# Patient Record
Sex: Female | Born: 1955 | Race: Black or African American | Hispanic: No | Marital: Single | Smoking: Current some day smoker
Health system: Southern US, Community
[De-identification: ages and names within clinical notes are randomized; demographics above are authoritative.]

## PROBLEM LIST (undated history)

## (undated) DIAGNOSIS — Z8489 Family history of other specified conditions: Secondary | ICD-10-CM

## (undated) DIAGNOSIS — J189 Pneumonia, unspecified organism: Secondary | ICD-10-CM

## (undated) DIAGNOSIS — N858 Other specified noninflammatory disorders of uterus: Secondary | ICD-10-CM

## (undated) DIAGNOSIS — M1990 Unspecified osteoarthritis, unspecified site: Secondary | ICD-10-CM

## (undated) DIAGNOSIS — N186 End stage renal disease: Secondary | ICD-10-CM

## (undated) DIAGNOSIS — F329 Major depressive disorder, single episode, unspecified: Secondary | ICD-10-CM

## (undated) DIAGNOSIS — R51 Headache: Secondary | ICD-10-CM

## (undated) DIAGNOSIS — F32A Depression, unspecified: Secondary | ICD-10-CM

## (undated) DIAGNOSIS — E669 Obesity, unspecified: Secondary | ICD-10-CM

## (undated) DIAGNOSIS — N055 Unspecified nephritic syndrome with diffuse mesangiocapillary glomerulonephritis: Secondary | ICD-10-CM

## (undated) DIAGNOSIS — I10 Essential (primary) hypertension: Secondary | ICD-10-CM

## (undated) DIAGNOSIS — G709 Myoneural disorder, unspecified: Secondary | ICD-10-CM

## (undated) DIAGNOSIS — E785 Hyperlipidemia, unspecified: Secondary | ICD-10-CM

## (undated) DIAGNOSIS — N2581 Secondary hyperparathyroidism of renal origin: Secondary | ICD-10-CM

## (undated) DIAGNOSIS — R519 Headache, unspecified: Secondary | ICD-10-CM

## (undated) DIAGNOSIS — K219 Gastro-esophageal reflux disease without esophagitis: Secondary | ICD-10-CM

## (undated) DIAGNOSIS — Z992 Dependence on renal dialysis: Secondary | ICD-10-CM

## (undated) DIAGNOSIS — Z86010 Personal history of colonic polyps: Secondary | ICD-10-CM

## (undated) DIAGNOSIS — D649 Anemia, unspecified: Secondary | ICD-10-CM

## (undated) DIAGNOSIS — IMO0001 Reserved for inherently not codable concepts without codable children: Secondary | ICD-10-CM

## (undated) DIAGNOSIS — N049 Nephrotic syndrome with unspecified morphologic changes: Secondary | ICD-10-CM

## (undated) HISTORY — DX: Personal history of colonic polyps: Z86.010

## (undated) HISTORY — PX: ESOPHAGOGASTRODUODENOSCOPY: SHX1529

## (undated) HISTORY — DX: Nephrotic syndrome with unspecified morphologic changes: N04.9

## (undated) HISTORY — DX: Unspecified nephritic syndrome with diffuse mesangiocapillary glomerulonephritis: N05.5

## (undated) HISTORY — PX: UPPER GASTROINTESTINAL ENDOSCOPY: SHX188

## (undated) HISTORY — DX: Hyperlipidemia, unspecified: E78.5

## (undated) HISTORY — DX: Unspecified osteoarthritis, unspecified site: M19.90

## (undated) HISTORY — DX: Other specified noninflammatory disorders of uterus: N85.8

## (undated) HISTORY — DX: Myoneural disorder, unspecified: G70.9

## (undated) HISTORY — DX: Obesity, unspecified: E66.9

## (undated) HISTORY — PX: COLONOSCOPY: SHX174

## (undated) HISTORY — DX: Anemia, unspecified: D64.9

## (undated) HISTORY — DX: Essential (primary) hypertension: I10

---

## 1998-03-26 ENCOUNTER — Other Ambulatory Visit: Admission: RE | Admit: 1998-03-26 | Discharge: 1998-03-26 | Payer: Self-pay | Admitting: Obstetrics & Gynecology

## 1998-06-23 ENCOUNTER — Emergency Department (HOSPITAL_COMMUNITY): Admission: EM | Admit: 1998-06-23 | Discharge: 1998-06-23 | Payer: Self-pay | Admitting: Emergency Medicine

## 2000-09-06 ENCOUNTER — Other Ambulatory Visit: Admission: RE | Admit: 2000-09-06 | Discharge: 2000-09-06 | Payer: Self-pay | Admitting: *Deleted

## 2002-01-29 ENCOUNTER — Encounter: Payer: Self-pay | Admitting: Emergency Medicine

## 2002-01-29 ENCOUNTER — Emergency Department (HOSPITAL_COMMUNITY): Admission: EM | Admit: 2002-01-29 | Discharge: 2002-01-29 | Payer: Self-pay | Admitting: Emergency Medicine

## 2002-01-29 ENCOUNTER — Ambulatory Visit (HOSPITAL_COMMUNITY): Admission: RE | Admit: 2002-01-29 | Discharge: 2002-01-29 | Payer: Self-pay | Admitting: Emergency Medicine

## 2003-08-01 ENCOUNTER — Encounter (INDEPENDENT_AMBULATORY_CARE_PROVIDER_SITE_OTHER): Payer: Self-pay | Admitting: *Deleted

## 2003-08-01 ENCOUNTER — Ambulatory Visit (HOSPITAL_COMMUNITY): Admission: RE | Admit: 2003-08-01 | Discharge: 2003-08-01 | Payer: Self-pay | Admitting: *Deleted

## 2004-05-15 ENCOUNTER — Emergency Department (HOSPITAL_COMMUNITY): Admission: EM | Admit: 2004-05-15 | Discharge: 2004-05-15 | Payer: Self-pay | Admitting: Emergency Medicine

## 2004-12-27 HISTORY — PX: KNEE ARTHROSCOPY: SUR90

## 2005-11-12 ENCOUNTER — Encounter: Admission: RE | Admit: 2005-11-12 | Discharge: 2005-11-12 | Payer: Self-pay | Admitting: Internal Medicine

## 2005-11-22 ENCOUNTER — Ambulatory Visit (HOSPITAL_BASED_OUTPATIENT_CLINIC_OR_DEPARTMENT_OTHER): Admission: RE | Admit: 2005-11-22 | Discharge: 2005-11-22 | Payer: Self-pay | Admitting: Orthopaedic Surgery

## 2005-11-22 ENCOUNTER — Ambulatory Visit (HOSPITAL_COMMUNITY): Admission: RE | Admit: 2005-11-22 | Discharge: 2005-11-22 | Payer: Self-pay | Admitting: Orthopaedic Surgery

## 2006-09-28 ENCOUNTER — Encounter: Admission: RE | Admit: 2006-09-28 | Discharge: 2006-09-28 | Payer: Self-pay | Admitting: Internal Medicine

## 2006-10-13 ENCOUNTER — Encounter: Admission: RE | Admit: 2006-10-13 | Discharge: 2006-10-13 | Payer: Self-pay | Admitting: Internal Medicine

## 2007-01-30 ENCOUNTER — Emergency Department (HOSPITAL_COMMUNITY): Admission: EM | Admit: 2007-01-30 | Discharge: 2007-01-30 | Payer: Self-pay | Admitting: Emergency Medicine

## 2007-03-29 ENCOUNTER — Encounter: Admission: RE | Admit: 2007-03-29 | Discharge: 2007-03-29 | Payer: Self-pay | Admitting: Internal Medicine

## 2008-07-24 ENCOUNTER — Ambulatory Visit (HOSPITAL_COMMUNITY): Admission: RE | Admit: 2008-07-24 | Discharge: 2008-07-24 | Payer: Self-pay | Admitting: Nephrology

## 2008-07-26 ENCOUNTER — Encounter (INDEPENDENT_AMBULATORY_CARE_PROVIDER_SITE_OTHER): Payer: Self-pay | Admitting: Nephrology

## 2008-07-26 ENCOUNTER — Ambulatory Visit (HOSPITAL_COMMUNITY): Admission: RE | Admit: 2008-07-26 | Discharge: 2008-07-27 | Payer: Self-pay | Admitting: Nephrology

## 2008-08-05 ENCOUNTER — Encounter: Admission: RE | Admit: 2008-08-05 | Discharge: 2008-08-05 | Payer: Self-pay | Admitting: Nephrology

## 2009-08-18 ENCOUNTER — Emergency Department (HOSPITAL_COMMUNITY): Admission: EM | Admit: 2009-08-18 | Discharge: 2009-08-18 | Payer: Self-pay | Admitting: Emergency Medicine

## 2009-08-22 ENCOUNTER — Emergency Department (HOSPITAL_COMMUNITY): Admission: EM | Admit: 2009-08-22 | Discharge: 2009-08-22 | Payer: Self-pay | Admitting: Emergency Medicine

## 2009-10-31 ENCOUNTER — Inpatient Hospital Stay (HOSPITAL_COMMUNITY): Admission: AD | Admit: 2009-10-31 | Discharge: 2009-11-07 | Payer: Self-pay | Admitting: Nephrology

## 2009-11-05 ENCOUNTER — Encounter (INDEPENDENT_AMBULATORY_CARE_PROVIDER_SITE_OTHER): Payer: Self-pay | Admitting: Interventional Radiology

## 2009-12-05 ENCOUNTER — Encounter: Admission: RE | Admit: 2009-12-05 | Discharge: 2009-12-05 | Payer: Self-pay | Admitting: Nephrology

## 2009-12-17 ENCOUNTER — Inpatient Hospital Stay (HOSPITAL_COMMUNITY): Admission: AD | Admit: 2009-12-17 | Discharge: 2010-01-09 | Payer: Self-pay | Admitting: Nephrology

## 2009-12-25 ENCOUNTER — Encounter (INDEPENDENT_AMBULATORY_CARE_PROVIDER_SITE_OTHER): Payer: Self-pay | Admitting: Nephrology

## 2009-12-27 HISTORY — PX: VAGINAL HYSTERECTOMY: SUR661

## 2009-12-31 ENCOUNTER — Ambulatory Visit: Payer: Self-pay | Admitting: Vascular Surgery

## 2010-01-02 HISTORY — PX: AV FISTULA PLACEMENT: SHX1204

## 2010-02-19 ENCOUNTER — Encounter (INDEPENDENT_AMBULATORY_CARE_PROVIDER_SITE_OTHER): Payer: Self-pay | Admitting: *Deleted

## 2010-02-19 ENCOUNTER — Ambulatory Visit (HOSPITAL_COMMUNITY): Admission: RE | Admit: 2010-02-19 | Discharge: 2010-02-19 | Payer: Self-pay | Admitting: Nephrology

## 2010-02-24 HISTORY — PX: ARTERIOVENOUS GRAFT PLACEMENT: SUR1029

## 2010-02-26 ENCOUNTER — Ambulatory Visit: Payer: Self-pay | Admitting: Vascular Surgery

## 2010-04-09 ENCOUNTER — Ambulatory Visit: Payer: Self-pay | Admitting: Vascular Surgery

## 2010-04-13 ENCOUNTER — Encounter (INDEPENDENT_AMBULATORY_CARE_PROVIDER_SITE_OTHER): Payer: Self-pay | Admitting: *Deleted

## 2010-04-17 ENCOUNTER — Encounter: Payer: Self-pay | Admitting: Internal Medicine

## 2010-04-21 ENCOUNTER — Ambulatory Visit: Payer: Self-pay | Admitting: Surgery

## 2010-04-21 ENCOUNTER — Ambulatory Visit (HOSPITAL_COMMUNITY): Admission: RE | Admit: 2010-04-21 | Discharge: 2010-04-21 | Payer: Self-pay | Admitting: Surgery

## 2010-05-07 ENCOUNTER — Ambulatory Visit: Payer: Self-pay | Admitting: Internal Medicine

## 2010-05-07 DIAGNOSIS — M1990 Unspecified osteoarthritis, unspecified site: Secondary | ICD-10-CM | POA: Insufficient documentation

## 2010-05-07 DIAGNOSIS — N049 Nephrotic syndrome with unspecified morphologic changes: Secondary | ICD-10-CM

## 2010-05-07 DIAGNOSIS — I10 Essential (primary) hypertension: Secondary | ICD-10-CM | POA: Insufficient documentation

## 2010-05-07 DIAGNOSIS — R079 Chest pain, unspecified: Secondary | ICD-10-CM

## 2010-05-07 HISTORY — DX: Nephrotic syndrome with unspecified morphologic changes: N04.9

## 2010-05-13 ENCOUNTER — Telehealth (INDEPENDENT_AMBULATORY_CARE_PROVIDER_SITE_OTHER): Payer: Self-pay | Admitting: *Deleted

## 2010-06-17 ENCOUNTER — Encounter: Payer: Self-pay | Admitting: Internal Medicine

## 2010-06-24 ENCOUNTER — Encounter: Payer: Self-pay | Admitting: Internal Medicine

## 2010-06-30 IMAGING — CR DG CHEST 1V PORT
1 series · 1 of 1 positions shown · non-contrast
Comparison: Dialysis, tachycardia

CLINICAL DATA: Volume overload, anasarca

PORTABLE CHEST - 1 VIEW

[view not recorded]
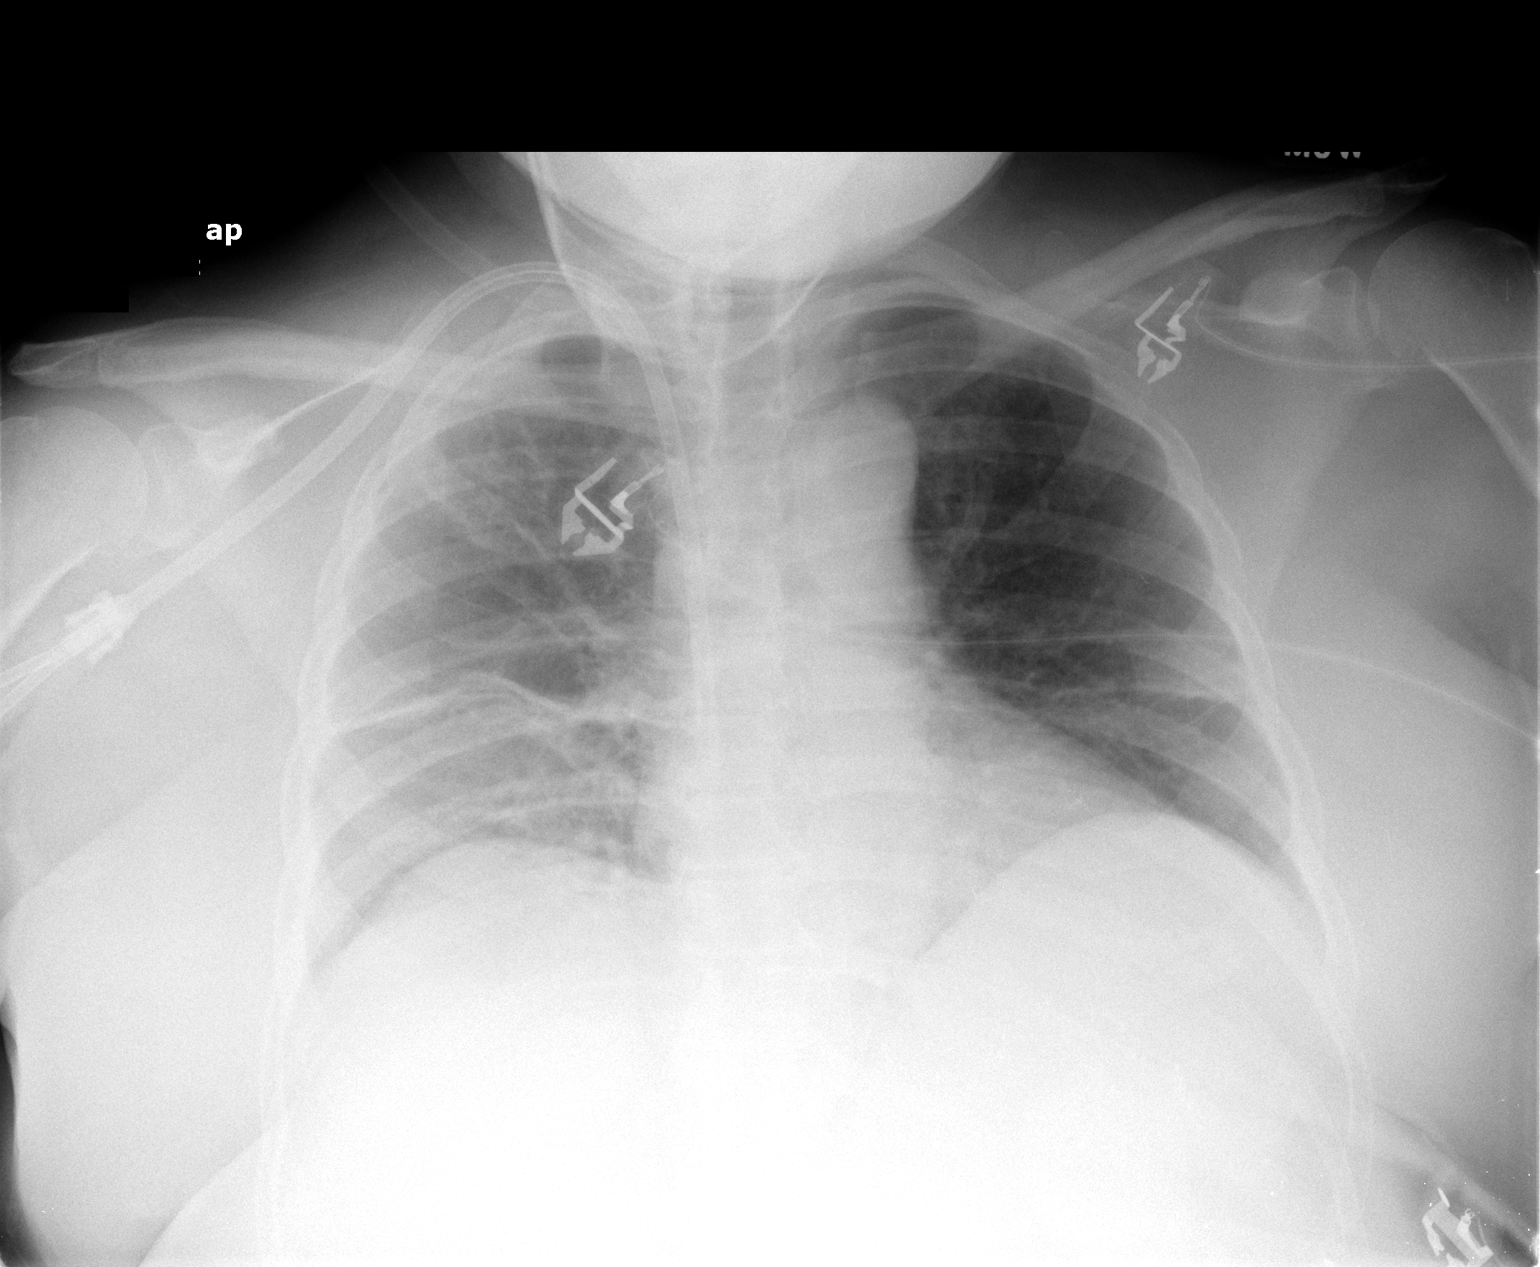

[1 of 1 positions shown; findings below may reference images not displayed]

FINDINGS: There is a large bore central venous line from the right
with tip in the distal SVC unchanged from prior.  Stable cardiac
silhouette.  There is linear atelectasis within both lungs
unchanged from prior.  No evidence pneumothorax.  No evidence of
pulmonary edema.
IMPRESSION: 1.  No significant change.
2.  Bilateral lower lobe and right upper lobe atelectasis.

## 2010-07-01 ENCOUNTER — Encounter: Payer: Self-pay | Admitting: Internal Medicine

## 2010-07-06 ENCOUNTER — Telehealth (INDEPENDENT_AMBULATORY_CARE_PROVIDER_SITE_OTHER): Payer: Self-pay | Admitting: *Deleted

## 2010-07-07 ENCOUNTER — Ambulatory Visit: Payer: Self-pay | Admitting: Internal Medicine

## 2010-07-07 ENCOUNTER — Encounter: Payer: Self-pay | Admitting: Cardiology

## 2010-07-07 ENCOUNTER — Ambulatory Visit: Payer: Self-pay

## 2010-07-07 ENCOUNTER — Ambulatory Visit: Payer: Self-pay | Admitting: Cardiology

## 2010-07-07 ENCOUNTER — Encounter (HOSPITAL_COMMUNITY): Admission: RE | Admit: 2010-07-07 | Discharge: 2010-09-02 | Payer: Self-pay | Admitting: Internal Medicine

## 2010-07-08 ENCOUNTER — Encounter: Payer: Self-pay | Admitting: Internal Medicine

## 2010-07-10 ENCOUNTER — Encounter: Payer: Self-pay | Admitting: Internal Medicine

## 2010-07-13 ENCOUNTER — Encounter: Payer: Self-pay | Admitting: Internal Medicine

## 2010-07-13 ENCOUNTER — Inpatient Hospital Stay (HOSPITAL_COMMUNITY): Admission: EM | Admit: 2010-07-13 | Discharge: 2010-07-15 | Payer: Self-pay | Admitting: Emergency Medicine

## 2010-07-14 ENCOUNTER — Encounter: Payer: Self-pay | Admitting: Internal Medicine

## 2010-07-15 ENCOUNTER — Encounter: Payer: Self-pay | Admitting: Internal Medicine

## 2010-07-15 LAB — CONVERTED CEMR LAB
Cholesterol: 206 mg/dL — ABNORMAL HIGH (ref 0–200)
HDL: 31.2 mg/dL — ABNORMAL LOW (ref >39.00)
Triglycerides: 181 mg/dL — ABNORMAL HIGH (ref 0.0–149.0)

## 2010-07-16 ENCOUNTER — Telehealth (INDEPENDENT_AMBULATORY_CARE_PROVIDER_SITE_OTHER): Payer: Self-pay | Admitting: *Deleted

## 2010-07-16 ENCOUNTER — Encounter: Payer: Self-pay | Admitting: Internal Medicine

## 2010-07-23 ENCOUNTER — Ambulatory Visit: Payer: Self-pay | Admitting: Internal Medicine

## 2010-07-23 DIAGNOSIS — R630 Anorexia: Secondary | ICD-10-CM | POA: Insufficient documentation

## 2010-07-23 DIAGNOSIS — R112 Nausea with vomiting, unspecified: Secondary | ICD-10-CM

## 2010-07-23 DIAGNOSIS — N9489 Other specified conditions associated with female genital organs and menstrual cycle: Secondary | ICD-10-CM | POA: Insufficient documentation

## 2010-07-23 DIAGNOSIS — R109 Unspecified abdominal pain: Secondary | ICD-10-CM | POA: Insufficient documentation

## 2010-07-29 ENCOUNTER — Inpatient Hospital Stay (HOSPITAL_COMMUNITY): Admission: EM | Admit: 2010-07-29 | Discharge: 2010-07-30 | Payer: Self-pay | Admitting: Emergency Medicine

## 2010-07-30 ENCOUNTER — Encounter: Payer: Self-pay | Admitting: Internal Medicine

## 2010-08-25 ENCOUNTER — Ambulatory Visit (HOSPITAL_COMMUNITY): Admission: RE | Admit: 2010-08-25 | Discharge: 2010-08-25 | Payer: Self-pay | Admitting: Vascular Surgery

## 2010-08-25 ENCOUNTER — Ambulatory Visit: Payer: Self-pay | Admitting: Surgery

## 2010-09-02 ENCOUNTER — Telehealth: Payer: Self-pay | Admitting: Internal Medicine

## 2010-09-08 ENCOUNTER — Encounter: Payer: Self-pay | Admitting: Internal Medicine

## 2010-09-09 ENCOUNTER — Encounter (HOSPITAL_COMMUNITY)
Admission: RE | Admit: 2010-09-09 | Discharge: 2010-12-08 | Payer: Self-pay | Source: Home / Self Care | Attending: Nephrology | Admitting: Nephrology

## 2010-09-16 ENCOUNTER — Encounter (HOSPITAL_BASED_OUTPATIENT_CLINIC_OR_DEPARTMENT_OTHER): Admission: RE | Admit: 2010-09-16 | Discharge: 2010-10-21 | Payer: Self-pay | Admitting: Internal Medicine

## 2010-09-24 ENCOUNTER — Ambulatory Visit: Payer: Self-pay | Admitting: Internal Medicine

## 2010-09-24 DIAGNOSIS — E785 Hyperlipidemia, unspecified: Secondary | ICD-10-CM

## 2010-09-25 ENCOUNTER — Telehealth: Payer: Self-pay | Admitting: Internal Medicine

## 2010-09-25 ENCOUNTER — Telehealth (INDEPENDENT_AMBULATORY_CARE_PROVIDER_SITE_OTHER): Payer: Self-pay | Admitting: *Deleted

## 2010-09-29 ENCOUNTER — Ambulatory Visit: Payer: Self-pay | Admitting: Internal Medicine

## 2010-10-22 LAB — CONVERTED CEMR LAB
Direct LDL: 191.5 mg/dL
HDL: 44 mg/dL (ref >39.00)
Total CHOL/HDL Ratio: 6
VLDL: 27.2 mg/dL (ref 0.0–40.0)

## 2010-10-30 ENCOUNTER — Encounter: Payer: Self-pay | Admitting: Internal Medicine

## 2010-11-12 ENCOUNTER — Ambulatory Visit (HOSPITAL_COMMUNITY)
Admission: RE | Admit: 2010-11-12 | Discharge: 2010-11-12 | Payer: Self-pay | Source: Home / Self Care | Admitting: Internal Medicine

## 2010-11-12 ENCOUNTER — Ambulatory Visit: Payer: Self-pay | Admitting: Cardiology

## 2010-11-12 ENCOUNTER — Encounter: Payer: Self-pay | Admitting: Internal Medicine

## 2010-11-12 ENCOUNTER — Ambulatory Visit: Payer: Self-pay

## 2011-01-07 IMAGING — US IR AV DIALYSIS GRAFT DECLOT
1 series · 1 of 1 positions shown · non-contrast
Comparison: none

Clinical Data/Indication: THROMBOSED LEFT ARM AV FISTULA

ARTERIOVENOUS SHUNT FOR DIALYSIS; ADDITIONAL ACCESS,ULTRASOUND
VENOUS ACCESS,PTA VENOUS,ARTERIOVENOUS DIALYSIS GRAFT DECLOT
Sedation: Versed three mg, Fentanyl 175 mg.
Total Moderate Sedation Time: 62 minutes.
Contrast Volume: 65 ml 5mnipaque-OFF.
Additional Medications: Heparin 8888 units.
Fluoroscopy Time: 12.7 minutes.
Procedure: The procedure, risks, benefits, and alternatives were
explained to the patient. Questions regarding the procedure were
encouraged and answered. The patient understands and consents to
the procedure.
The left arm was prepped with betadine in a sterile fashion, and a
sterile drape was applied covering the operative field. A sterile
gown and sterile gloves were used for the procedure.
A micropuncture needle was inserted into the outflow vein directed
towards the central venous structures under sonographic guidance.
It was removed over a 6646 wire.  A 5-French transitional dilator
was inserted.  t-PA was injected.  The Angiojet was then utilized
to [place] the thrombus in the cephalic vein for 100 seconds.
A second micropuncture needle was inserted into the outflow vein
directed towards the arteriovenous anastomosis. It was removed over
a 018 wire which was upsized to Nic Tiger.  6-French sheath was
inserted.  A Fogarty balloon was then utilized to sweep the
arterial plug and outflow vein.  Repeat imaging was performed.
Several stenoses were then dilated to 7 mm adjacent to the
arteriovenous anastomosis.  Final imaging was performed.

[Series 1: ir av dialysis graft declot · 1 of 1 slices shown]
[im 1/1]
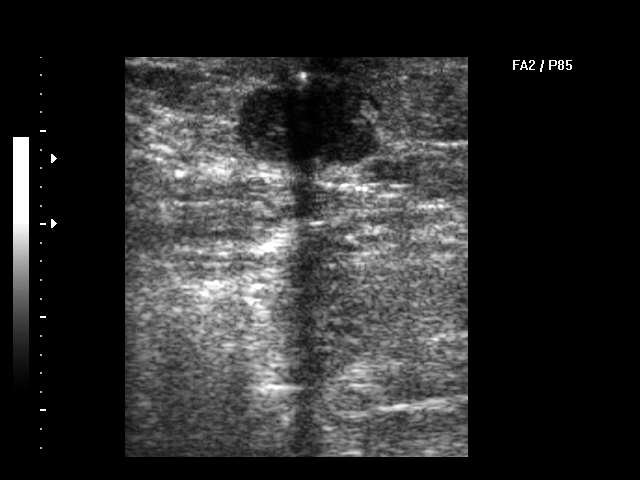

[1 of 1 positions shown; findings below may reference images not displayed]

FINDINGS: Central venography demonstrates patency.

Initial postangioplasty imaging demonstrates near resolution of the
thrombus with some focal areas of narrowing in the outflow vein.

Final imaging demonstrates patency with some persistent
irregularity of the outflow vein.

Complications: None
IMPRESSION: Successful thrombi lysis and thrombectomy of a left arm AV fistula.

## 2011-01-16 ENCOUNTER — Encounter: Payer: Self-pay | Admitting: Internal Medicine

## 2011-01-17 ENCOUNTER — Encounter: Payer: Self-pay | Admitting: Nephrology

## 2011-01-18 ENCOUNTER — Other Ambulatory Visit (HOSPITAL_COMMUNITY): Payer: Self-pay | Admitting: Nephrology

## 2011-01-18 ENCOUNTER — Other Ambulatory Visit (HOSPITAL_COMMUNITY): Payer: Self-pay | Admitting: Interventional Radiology

## 2011-01-18 DIAGNOSIS — N186 End stage renal disease: Secondary | ICD-10-CM

## 2011-01-22 ENCOUNTER — Ambulatory Visit (HOSPITAL_COMMUNITY)
Admission: RE | Admit: 2011-01-22 | Discharge: 2011-01-22 | Payer: Self-pay | Source: Home / Self Care | Attending: Nephrology | Admitting: Nephrology

## 2011-01-26 NOTE — Miscellaneous (Signed)
  Clinical Lists Changes  Orders: Added new Referral order of Echocardiogram (Echo) - Signed 

## 2011-01-26 NOTE — Progress Notes (Signed)
Summary: Nuclear Pre-Procedure  Phone Note Outgoing Call Call back at [facility] Phone [phone]   Call placed by: Stanton Kidney, EMT-P,  May 13, 2010 12:04 PM Action Taken: Phone Call Completed Summary of Call: Left message with information on Myoview Information Sheet (see scanned document for details).     Nuclear Med Background Indications for Stress Test: Evaluation for Ischemia   History: CT/MRI  History Comments: 01/05/10 CT: Chest  Symptoms: Chest Pain, Chest Pressure    Nuclear Pre-Procedure Cardiac Risk Factors: History of Smoking, Hypertension Height (in): 63

## 2011-01-26 NOTE — Discharge Summary (Signed)
Summary: Small Bowel Ileus  NAME:  Kim Hodge, Kim Hodge                ACCOUNT NO.:  [id number]      MEDICAL RECORD NO.:  [id number]          PATIENT TYPE:  INP      LOCATION:  6711                         FACILITY:  [facility]      PHYSICIAN:  Mauro Kaufmann, MD         DATE OF BIRTH:  1956-08-03      DATE OF ADMISSION:  07/13/2010   DATE OF DISCHARGE:  07/15/2010                                  DISCHARGE SUMMARY      ADMISSION DIAGNOSIS:   1. Small bowel ileus.   2. End stage renal disease.   3. Hypertension.   4. History of glomerulonephritis.      DISCHARGE DIAGNOSES:   1. 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   2. Small bowel obstruction improved.   3. End stage renal disease on hemodialysis.  The patient will receive       hemodialysis in a.m. at [facility].   4. Hypertension.      TESTS PERFORMED DURING THE HOSPITAL STAY:   1. X-ray of the abdomen on July 18 showed abdominal bowel gas pattern       with dilated fluid and air filled loops of small bowel; this could       be ileus or small bowel obstruction.   2. CT of the pelvis on July 19 showed large mass arising from the left       pelvis likely __________ region highly suspicious for neoplasm.       Small free pelvic fluid may be secondary.  No evidence of bowel       obstruction or significant ileus.  At least 1 uterine fibroid.       Mass measures 14.3 x 12.7 cm on image 47 transfers.  Similarly 15.6       cm craniocaudal on caudal image 65; this is minimally heterogenous       in density.   3. Thrombosed left arm AV fistula.  The patient underwent successful       thrombolysis and thrombectomy of the left arm AV fistula on July       20.      PERTINENT LABS ON THE DAY OF DISCHARGE:  As of July 19, the patient's   potassium is 4.6, sodium 139,  chloride is 104, CO2 26, BUN 15,   creatinine 4.45, glucose 96.  CA-125 is 45.7.  Urinalysis showed 7 to 10   RBCs, zero to 2 WBCs per high power field, nitrite negative, leukocyte   esterase negative.      BRIEF HOSPITAL COURSE:   1. Ovarian mass.  The patient was seen by GYN in the hospital and       because of the possibility of the malignancy, GYN recommended the       patient be transferred to [facility] for further evaluation       and possible surgery.  At this time the patient's symptoms have       improved.  She is able to take liquid diet  but the size of the mass       probably will cause more compressive symptoms on the bowels.  So       the patient will be transferred to [facility] and Dr. Kyla Balzarine at       [facility] with GYN oncology accepted the patient for       transfer.   2. End stage renal disease.  The patient is currently on hemodialysis       Monday, Wednesday and  Friday.  Today the patient did not get       dialysis as she had a thrombectomy done from the left arm AV       fistula.  The patient will need  dialysis tomorrow and at this time       the patient's BUN/creatinine and potassium were stable so the       patient will be transferred to [facility]      The patient's vitals on day of discharge:  Temperature 97.9, pulse 71,   respiration 18, blood pressure 120/72, O2 saturations 96% on room air.      MEDICATIONS UPON DISCHARGE:   1. Atenolol 50 mg p.o. daily.   2. Amlodipine 10 mg p.o. daily.   3. Aspirin  81 mg p.o. daily.   4. Epoetin alfa injection 20,000 units intravenous on Monday,       Wednesday and Friday.   5. Ferrous sulfate 325 mg p.o. daily.   6. Glimepiride 1 mg p.o. every morning.   7. Hydralazine 50 mg 1 tablet 3 times a day.   8. Capadex 60 mg 1 capsule p.o. daily.   9. Megestrol oral suspension 4 mg by mouth daily.   10.Multivitamin 1 tablet p.o. daily.   11.Omeprazole 20 mg 1-2 tablets per mouth daily as needed.   12.Phos-Lo 2 tablets per mouth 3 times a day with meals.   13.Promethazine 25 mg 1 tablet by mouth daily as needed.   14.Zemplar 2 mcg intravenous on Monday, Wednesday and Friday.        The patient is stable at time of transfer.               Mauro Kaufmann, MD            GL/MEDQ  D:  07/15/2010  T:  07/15/2010  Job:  [id number]      cc:   Dr. Senaida Ores, GYN   Dr. Alfonse Flavors F. Ambrose Mantle, M.D.

## 2011-01-26 NOTE — Procedures (Signed)
Summary: EGD   EGD  Procedure date:  08/01/2003  Findings:      Location: [facility]     NAME:  Kim Hodge, Kim Hodge                          ACCOUNT NO.:  [id number]   MEDICAL RECORD NO.:  [id number]                   PATIENT TYPE:  AMB   LOCATION:  ENDO                                 FACILITY:  [facility]   PHYSICIAN:  Georgiana Spinner, M.D.                 DATE OF BIRTH:  Jun 07, 1956   DATE OF PROCEDURE:  08/01/2003  DATE OF DISCHARGE:                                 OPERATIVE REPORT   PROCEDURE:  Upper endoscopy.   INDICATIONS:  Hemoccult positivity, presumably.   ANESTHESIA:  1. Demerol 50 mg.  2. Versed 5 mg.   DESCRIPTION OF PROCEDURE:  With patient mildly sedated in the left lateral  decubitus position, the Olympus videoscopic endoscope was inserted in the  mouth, passed under direct vision through the esophagus, which appeared  normal, into the stomach.  Fundus, body, antrum, duodenal bulb, and second  portion of duodenum all appeared normal.  From this point, the endoscope was  slowly withdrawn, taking circumferential views of the duodenal mucosa until  the endoscope then pulled back into the stomach, placed in retroflexion to  view the stomach from below.  The endoscope was straightened and withdrawn,  taking circumferential views of the remaining gastric and esophageal mucosa.  The patient's vital signs and pulse oximeter remained stable.  The patient  tolerated the procedure well without apparent complications.   FINDINGS:  Incomplete wrap of GE junction around the endoscope of a mild  degree.  Otherwise, an unremarkable examination.   PLAN:  Proceed to colonoscopy.                                               Georgiana Spinner, M.D.    GMO/MEDQ  D:  08/01/2003  T:  08/01/2003  Job:  [id number]   This report was created from the original endoscopy report, which was reviewed and signed by the above listed endoscopist.

## 2011-01-26 NOTE — Progress Notes (Signed)
  Phone Note From Other Clinic   Caller: Chris/[facility] Initial call taken by: Antonietta Breach lead faxed to [phone] [facility] [place]  July 16, 2010 8:23 AM

## 2011-01-26 NOTE — Progress Notes (Signed)
  ROI signed by pt, Faxed over to [facility] @ [phone] [facility]  September 25, 2010 10:16 AM      Appended Document:  Records recieevd back form [facility] gave to [place]. Dr.Ross Pt

## 2011-01-26 NOTE — Progress Notes (Signed)
Summary: test results/ answering machine message  Phone Note Call from Patient Call back at Home Phone [phone]   Caller: Patient Reason for Call: Talk to Nurse, Lab or Test Results Details for Reason: stress test.  Initial call taken by: Lorne Skeens,  September 02, 2010 9:06 AM  Follow-up for Phone Call        RN s/w Pt re: unsure who called her re: an appt. Pt says she could not understand the message. RN reviewed appt scheduled for 9-29 with Dr Tenny Craw. Pt verbalizes understanding. . Follow-up by: Bernita Raisin, RN, BSN,  September 02, 2010 9:13 AM

## 2011-01-26 NOTE — Assessment & Plan Note (Signed)
Summary: Cardiology Nuclear Study  Nuclear Med Background Indications for Stress Test: Evaluation for Ischemia    History Comments: NO DOCUMENTED CAD  Symptoms: Chest Pain, Chest Pressure, Diaphoresis, Fatigue, Fatigue with Exertion, Rapid HR, Vomiting  Symptoms Comments: Last episode of JX:BJYNW 2011.   Nuclear Pre-Procedure Cardiac Risk Factors: Hypertension, Obesity, Smoker Caffeine/Decaff Intake: None NPO After: 7:00 PM Lungs: Clear IV 0.9% NS with Angio Cath: 18     IV Site: (R) AC IV Started by: Stanton Kidney EMT-P Chest Size (in) 40     Cup Size D     Height (in): 63 Weight (lb): 172 BMI: 30.58  Nuclear Med Study 1 or 2 day study:  1 day     Stress Test Type:  Eugenie Birks Reading MD:  Willa Rough, MD     Referring MD:  Dietrich Pates, MD Resting Radionuclide:  Technetium 77m Tetrofosmin     Resting Radionuclide Dose:  11 mCi  Stress Radionuclide:  Technetium 14m Tetrofosmin     Stress Radionuclide Dose:  33 mCi   Stress Protocol   Lexiscan: 0.4 mg   Stress Test Technologist:  [employer]a [employer] CMA-N     Nuclear Technologist:  Domenic Polite CNMT  Rest Procedure  Myocardial perfusion imaging was performed at rest 45 minutes following the intravenous administration of Myoview Technetium 59m Tetrofosmin.  Stress Procedure  The patient received IV Lexiscan 0.4 mg over 15-seconds.  Myoview injected at 30-seconds.  There were no significant changes with infusion.  Quantitative spect images were obtained after a 45 minute delay.  QPS Raw Data Images:  Normal; no motion artifact; normal heart/lung ratio. Stress Images:  Decreased activity in the inferior wall. Rest Images:  Similar to stress Subtraction (SDS):  Slight reversibility Transient Ischemic Dilatation:  1.1  (Normal <1.22)  Lung/Heart Ratio:  .41  (Normal <0.45)  Quantitative Gated Spect Images QGS EDV:  99 ml QGS ESV:  29 ml QGS EF:  71 % QGS cine images:  Normal Motion  Findings Abnormal      Overall  Impression  Exercise Capacity: Lexiscan BP Response: Normal blood pressure response. Clinical Symptoms: chest tight ECG Impression: No significant ST segment change suggestive of ischemia. Overall Impression Comments: There is decreased activity in the inferior wall. This seems most consistent with possible mild scar with slight ischemia. However, wall motion is completely normal. I can not rule out attenuation as the basis of the inferior findings.  Appended Document: Cardiology Nuclear Study Contacted patient to discuss test results.  Myoview with inferior defect consistent with scar vs soft tissue attenuation .  Very mild ischema.  WOuld recommend echo to evaluate wall motion fully.  Patient very upset.  said she cannot think about this now.  Going to [facility] to evaluate possible ovarian mass. Told pt/sister to call if results needed by [place]

## 2011-01-26 NOTE — Assessment & Plan Note (Signed)
Summary: appt 10:45/ROV/pleas fax notes to number below   Visit Type:  Follow-up Primary Provider:  Terrial Rhodes, MD   History of Present Illness: Patient is a 55 year old with no history of CAD.  SHe is referred for evaluation of chest pain Per her report, she has had 3 episodes of chest pressure since January.  Alll the episodes occurred duing dialysis.  SHe says they occurred on days when they were trying to pull too much fluid off.  No associated SOB.  No dizziness.  When they stop dialysis it stops.  Does not recur when restart SHe has not had any with other activities.  Her activity is down, though, since she is no longer working. patient had a myoview scan that showed scar and or soft tissue attenuation with very mild ischemia.  Recommended echo since then she has been seen at [facility] and underwent a hysterectomy.  No malignancy was found She is back in [place].  Undergoing regular dialysis.  Denies any further chest pain. Of note, she had an echo at [facility] this summer  Current Medications (verified): 1)  Promethazine Hcl 25 Mg Tabs (Promethazine Hcl) .... As Needed For Nausea 2)  Amlodipine Besylate 10 Mg Tabs (Amlodipine Besylate) .... Take One Tablet Once Daily 3)  Aspirin Ec Low Dose 81 Mg Tbec (Aspirin) .... Once Daily 4)  Calcium Acetate 667 Mg Caps (Calcium Acetate (Phos Binder)) .... Take 3 Capsules By Mouth Three Times A Day 5)  Renax 5.5 Mg Tabs (Multiple Vitamins-Minerals-Fa) .... Once Daily 6)  Atenolol 50 Mg Tabs (Atenolol) .... Once Daily 7)  Stool Softener 100 Mg Caps (Docusate Sodium) .... As Needed 8)  Dexilant 60 Mg Cpdr (Dexlansoprazole) .... As Needed 9)  Cipro 500 Mg Tabs (Ciprofloxacin Hcl) .Marland Kitchen.. 1 Tab Two Times A Day(Post - Hystertomy)  Allergies (verified): 1)  ! Ace Inhibitors  Past History:  Past medical, surgical, family and social histories (including risk factors) reviewed, and no changes noted (except as noted below).  Past Medical  History: Reviewed history from 07/23/2010 and no changes required. 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 operative arthroscopy of right knee Kidney biopsy Renal shunt  Family History: Reviewed history from 07/21/2010 and no changes required. Her father died at age 82 from complications of a stroke due to  hypertension.  History of hypertenision Mother died at age 90 from complications of breast cancer.   She has 4 sisters.  One died of an asthma attack.  Family history is significant for diabetes, strokes, hypertension,   cancer, anemia, and asthma.   No family history of kidney disease.  Family History of Ovarian Cancer: Sister  Social History: Reviewed history from 07/23/2010 and no changes required. Single  Tobacco Use - Former.   Quit in Jan 2011. previous 1/2 ppd for 32 yrs. Alcohol Use - no Drug Use - remote marijuana. Daily Caffeine Use 1  Review of Systems       ALl systems reviewed.  Negative to the above problem.  Vital Signs:  Patient profile:   55 year old female Height:      63 inches Weight:      156 pounds Pulse rate:   71 / minute BP sitting:   122 / 75  (left arm) Cuff size:   regular  Vitals Entered  By: Burnett Kanaris, CNA (September 24, 2010 2:14 PM)  Physical Exam  Additional Exam:  patient is in NAD HEENT:  Normocephalic, atraumatic. EOMI, PERRLA.  Neck: JVP is normal. No thyromegaly. No bruits.  Lungs: clear to auscultation. No rales no wheezes.  Heart: Regular rate and rhythm. Normal S1, S2. No S3.   No significant murmurs. PMI not displaced.  Abdomen:  Supple, nontender. Normal bowel sounds. No masses. No hepatomegaly.  Extremities:   Good distal pulses throughout. No  lower extremity edema.  Musculoskeletal :moving all extremities.  Neuro:   alert and oriented x3.    Impression & Recommendations:  Problem # 1:  CHEST PAIN-UNSPECIFIED (ICD-786.50) patient was referred earlier this year for chest pains.  Myoview had inferior scar and/or soft tissue attenaution with small amt ischemia. Since then she underwent hysterectomy at [facility].  She had an echo there which we will get reprot of Overall, she has not had any further spellls of chest pain.  Even with dialysis. I would not change her regimne.  I will review echo  Otherwise be available as needed.  Problem # 2:  HYPERTENSION (ICD-401.9) GOOd control  Problem # 3:  HYPERLIPIDEMIA-MIXED (ICD-272.4) LDL was 142 earlier this year.  Since she has had so much happen with surgery i would repeat lipids.  If still high would recommend Rx.  Patient Instructions: 1)  Your physician recommends that you schedule a follow-up appointment as needed 2)  Your physician recommends that you continue on your current medications as directed. Please refer to the Current Medication list given to you today. 3)  Please have fasting lipid profile drawn at dialysis.  You have prescription for this.

## 2011-01-26 NOTE — Letter (Signed)
Summary: [facility] Notes   [facility] Notes   Imported By: Roderic Ovens 11/06/2010 11:15:34  _____________________________________________________________________  External Attachment:    Type:   Image     Comment:   External Document

## 2011-01-26 NOTE — Assessment & Plan Note (Signed)
Summary: np6/chest pains sob   Visit Type:  Initial Consult Primary Provider:  DR COLONDO  CC:  chest pain.  History of Present Illness: Patient is a 55 year old with no history of CAD.  SHe is referred for evaluation of chest pain Per her report, she has had 3 episodes of chest pressure since January.  Alll the episodes occurred duing dialysis.  SHe says they occurred on days when they were trying to pull too much fluid off.  No associated SOB.  No dizziness.  When they stop dialysis it stops.  Does not recur when restart SHe has not had any with other activities.  Her activity is down, though, since she is no longer working.   Current Medications (verified): 1)  Megace Oral 40 Mg/ml Susp (Megestrol Acetate) .... 4 Mg Once Daily 2)  Promethazine Hcl 25 Mg Tabs (Promethazine Hcl) .... As Needed For Nausea 3)  Bystolic 5 Mg Tabs (Nebivolol Hcl) .... Take One Tablet Once Daily 4)  Amlodipine Besylate 10 Mg Tabs (Amlodipine Besylate) .... Take One Tablet Once Daily 5)  Amaryl 1 Mg Tabs (Glimepiride) .... Every Morning 1 Tablet 6)  Multivitamins  Tabs (Multiple Vitamin) .... Once Daily Daily Vite 7)  Omeprazole 20 Mg Cpdr (Omeprazole) .... Take One Capsule Daily, Two Times A Day If Needed 8)  Aspirin Ec Low Dose 81 Mg Tbec (Aspirin) .... Once Daily  Allergies (verified): 1)  ! Ace Inhibitors  Past History:  Past Surgical History: Last updated: 05-12-10 operative arthroscopy of right knee  Family History: Last updated: 05-12-10 Her father died at age 59 from complications of a stroke due to  hypertension.  History of hypertenision Mother died at age 2 from complications of breast cancer.   She has 4 sisters.  One died of an asthma attack.  Family history is significant for diabetes, strokes, hypertension,   cancer, anemia, and asthma.   No family history of kidney disease.   Social History: Last updated: 05/12/2010 Single  Tobacco Use - Former.   Quit in Jan 2011.  previous 1/2 ppd for 32 yrs. Alcohol Use - no Drug Use - remote marijuana.  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  History of hypertenision Mother died at age 47 from complications of breast cancer.   She has 4 sisters.  One died of an asthma attack.  Family history is significant for diabetes, strokes, hypertension,   cancer, anemia, and asthma.   No family history of kidney disease.   Social History: Single  Tobacco Use - Former.   Quit in Jan 2011. previous 1/2 ppd for 32 yrs. Alcohol Use - no Drug Use - remote marijuana.  Review of Systems       All systems reviewed.  Negatvie to the above problem.  Vital Signs:  Patient profile:   55 year old female Height:      63 inches Weight:      189 pounds BMI:     33.60 Pulse rate:   72 / minute BP sitting:   112 / 70  (left arm) Cuff size:   regular  Vitals Entered By: Burnett Kanaris, CNA (05/12/2010 1:54 PM)  Physical Exam  Additional Exam:  Patietn is in NAD HEENT:  Normocephalic, atraumatic. EOMI, PERRLA.  Neck: JVP is normal. No thyromegaly.  No bruits.  Lungs: clear to auscultation. No rales no wheezes.  Heart: Regular rate and rhythm. Normal S1, S2. No S3.   No significant murmurs. PMI not displaced.  Abdomen:  Supple, nontender. Normal bowel sounds. No masses. No hepatomegaly.  Extremities:   Good distal pulses throughout. No lower extremity edema.  Musculoskeletal :moving all extremities.  Neuro:   alert and oriented x3.    EKG  Procedure date:  04/17/2010  Findings:      NSR.  80 bpm.  Impression & Recommendations:  Problem # 1:  CHEST PAIN-UNSPECIFIED (ICD-786.50)  Patient has several risk factors for CAD.  Pain has been infrequent.  BUt, with risks and need for cont'd  dialysis will schedule myoviow to r/o significant ischemia. Will schedule myoview to evaluate.  Check on lipids.  Her updated medication list for this problem includes:    Bystolic 5 Mg Tabs (Nebivolol hcl) .Marland Kitchen... Take one tablet once daily    Amlodipine Besylate 10 Mg Tabs (Amlodipine besylate) .Marland Kitchen... Take one tablet once daily    Aspirin Ec Low Dose 81 Mg Tbec (Aspirin) ..... Once daily  Her updated medication list for this problem includes:    Bystolic 5 Mg Tabs (Nebivolol hcl) .Marland Kitchen... Take one tablet once daily    Amlodipine Besylate 10 Mg Tabs (Amlodipine besylate) .Marland Kitchen... Take one tablet once daily    Aspirin Ec Low Dose 81 Mg Tbec (Aspirin) ..... Once daily  Problem # 2:  HYPERTENSION (ICD-401.9)  Good control  Her updated medication list for this problem includes:    Bystolic 5 Mg Tabs (Nebivolol hcl) .Marland Kitchen... Take one tablet once daily    Amlodipine Besylate 10 Mg Tabs (Amlodipine besylate) .Marland Kitchen... Take one tablet once daily    Aspirin Ec Low Dose 81 Mg Tbec (Aspirin) ..... Once daily  Problem # 3:  NEPHROTIC SYNDROME (ICD-581.9) COntinue dialysis  Other Orders: Nuclear Stress Test (Nuc Stress Test)  Patient Instructions: 1)  Your physician has requested that you have an adenosine myoview.  For further information please visit [email or web address]  Please follow instruction sheet, as given. 2)  Your physician recommends that you return for a FASTING lipid profile: day of stress test  786.5

## 2011-01-26 NOTE — Letter (Signed)
Summary: [facility]   Imported By: Sherian Rein 07/27/2010 08:19:10  _____________________________________________________________________  External Attachment:    Type:   Image     Comment:   External Document

## 2011-01-26 NOTE — Letter (Signed)
Summary: New Patient letter  [facility]  [address] [place], [place] [address]   Phone: [phone]  Fax: [phone]       04/13/2010 MRN: [id number]  Kim Hodge [address] [place], [place]  [address]  Dear Ms. Bann,  Welcome to the Gastroenterology Division at [facility].    You are scheduled to see Dr. Jarold Motto on 05/12/2010 at 8:30AM on the 3rd floor at [facility], [address]  We ask that you try to arrive at our office 15 minutes prior to your appointment time to allow for check-in.  We would like you to complete the enclosed self-administered evaluation form prior to your visit and bring it with you on the day of your appointment.  We will review it with you.  Also, please bring a complete list of all your medications or, if you prefer, bring the medication bottles and we will list them.  Please bring your insurance card so that we may make a copy of it.  If your insurance requires a referral to see a specialist, please bring your referral form from your primary care physician.  Co-payments are due at the time of your visit and may be paid by cash, check or credit card.     Your office visit will consist of a consult with your physician (includes a physical exam), any laboratory testing he/she may order, scheduling of any necessary diagnostic testing (e.g. x-ray, ultrasound, CT-scan), and scheduling of a procedure (e.g. Endoscopy, Colonoscopy) if required.  Please allow enough time on your schedule to allow for any/all of these possibilities.    If you cannot keep your appointment, please call [phone] to cancel or reschedule prior to your appointment date.  This allows [place] the opportunity to schedule an appointment for another patient in need of care.  If you do not cancel or reschedule by 5 p.m. the business day prior to your appointment date, you will be charged a $50.00 late cancellation/no-show fee.    Thank you for choosing  [facility] for your medical needs.  We appreciate the opportunity to care for you.  Please visit [place] at our website  to learn more about our practice.                     Sincerely,                                                             The Gastroenterology Division

## 2011-01-26 NOTE — Progress Notes (Signed)
Summary: Nuclear Pre-Procedure  Phone Note Outgoing Call   Call placed by: Milana Na, EMT-P,  July 06, 2010 1:31 PM Summary of Call: Left message with information on Myoview Information Sheet (see scanned document for details).      Nuclear Med Background Indications for Stress Test: Evaluation for Ischemia   History: CT/MRI  History Comments: 01/05/10 CT: Chest  Symptoms: Chest Pain, Chest Pressure    Nuclear Pre-Procedure Cardiac Risk Factors: History of Smoking, Hypertension Height (in): 63  Nuclear Med Study Referring MD:  P.Ross

## 2011-01-26 NOTE — Progress Notes (Signed)
Summary: HD can draw blood but cannot procress  Phone Note Call from Patient Call back at Home Phone [phone]   Caller: Patient Reason for Call: Talk to Nurse Summary of Call: Hd can draw the blood but cannot process. pls advise.  Initial call taken by: Lorne Skeens,  September 25, 2010 3:43 PM  Follow-up for Phone Call        Called pateint back.... the dialysis center will not run a Lipid panel so she will come to [facility]. lab on 10/4 fasting for a Lipid panel .Will oder in IDX.  Layne Benton, RN, BSN  September 25, 2010 6:26 PM

## 2011-01-26 NOTE — Procedures (Signed)
Summary: Colon   Colonoscopy  Procedure date:  08/01/2003  Findings:      Location:  [facility].     NAME:  Kim Hodge, Kim Hodge                          ACCOUNT NO.:  [id number]   MEDICAL RECORD NO.:  [id number]                   PATIENT TYPE:  AMB   LOCATION:  ENDO                                 FACILITY:  [facility]   PHYSICIAN:  Georgiana Spinner, M.D.                 DATE OF BIRTH:  [birth year]   DATE OF PROCEDURE:  08/01/2003  DATE OF DISCHARGE:                                 OPERATIVE REPORT   PROCEDURE:  Colonoscopy.   INDICATIONS:  Hemoccult positivity, presumably.   ANESTHESIA:  1. Demerol 50 mg.  2. Versed 5 mg.   DESCRIPTION OF PROCEDURE:  With patient mildly sedated in the left lateral  decubitus position, the Olympus videoscopic colonoscope was inserted in the  rectum and passed under direct vision to the cecum, identified by the  ileocecal valve and appendiceal orifice, both of which were photographed.  From this point, the colonoscope was slowly withdrawn, taking  circumferential views of the entire colonic mucosa, stopping only in the  rectum which appeared normal on direct and retroflexed view.  The endoscope  was straightened and withdrawn.  The patient's vital signs and pulse  oximeter remained stable.  The patient tolerated the procedure well without  apparent complications.   FINDINGS:  Unremarkable colonoscopic examination.   PLAN:  As I have discussed with the patient previously, if these are  negative exams which they have been, then a gynecologic evaluation because  of menorrhagia may be in order.                                               Georgiana Spinner, M.D.    GMO/MEDQ  D:  08/01/2003  T:  08/01/2003  Job:  [id number]   This report was created from the original endoscopy report, which was reviewed and signed by the above listed endoscopist.

## 2011-01-26 NOTE — Assessment & Plan Note (Signed)
Summary: anorexia,nausea,vomiting...as.   History of Present Illness Visit Type: Initial Consult Primary GI MD: Stan Head MD [facility] Primary Provider: Terrial Rhodes, MD Requesting Provider: Terrial Rhodes, MD Chief Complaint: anorexia, Nausea & vomiting since patient started dialysis 1/11 History of Present Illness:   55 yo African-American woman that began hemodialysis in Jan 2011. She has episodic and intermittent vomiting without much nausea or vomiting. Occurs on average about twice a week. she notices it if she forces herself to eat it can happen, and if she gags while brushing her teeth. She also has early satiety and anorexia, and is losing 2#/week. some of that was fluid, probably most, but still losing weight.  She was hospitalized last week and 7/18-7/20 at [facility] and then [facility]. She had an ileus and ovarian mass on CT.Marland Kitchen Ovarian mass suspected to be malignant but transvaginal biopsy was negative for malignancy she says. Records reviewed.   GI Review of Systems    Reports abdominal pain, acid reflux, belching, chest pain, loss of appetite, nausea, vomiting, and  weight loss.     Location of  Abdominal pain: right side.    Denies bloating, dysphagia with liquids, dysphagia with solids, heartburn, vomiting blood, and  weight gain.      Reports constipation, diarrhea, hemorrhoids, and  irritable bowel syndrome.     Denies anal fissure, black tarry stools, change in bowel habit, diverticulosis, fecal incontinence, heme positive stool, jaundice, light color stool, liver problems, rectal bleeding, and  rectal pain. CT Abdomen/Pelvis  Procedure date:  07/14/2010  Findings:         1.  Large mass arising from the left pelvis, likely ovarian in   origin.  Highly suspicious for neoplasm.  Further evaluation with   gynecological consultation and consideration of abdominal/pelvic   MRI recommended. Case was discussed with Dr. Michaell Cowing prior to this   dictation on 07/14/2010.   2.   Small free pelvic fluid may be secondary.   3.  No evidence of bowel obstruction or significant ileus.   4.  At least one uterine fibroid.  This would also be better   evaluated at MRI.   5.  Possible too small to characterize right hepatic lobe lesion.      Current Medications (verified): 1)  Promethazine Hcl 25 Mg Tabs (Promethazine Hcl) .... As Needed For Nausea 2)  Amlodipine Besylate 10 Mg Tabs (Amlodipine Besylate) .... Take One Tablet Once Daily 3)  Aspirin Ec Low Dose 81 Mg Tbec (Aspirin) .... Once Daily 4)  Calcium Acetate 667 Mg Caps (Calcium Acetate (Phos Binder)) .... Take 3 Capsules By Mouth Three Times A Day 5)  Metolazone 5 Mg Tabs (Metolazone) .... Take 2 Tablets By Mouth Once Daily 6)  Renax 5.5 Mg Tabs (Multiple Vitamins-Minerals-Fa) .... Once Daily 7)  Atenolol 50 Mg Tabs (Atenolol) .... Once Daily 8)  Stool Softener 100 Mg Caps (Docusate Sodium) .... As Needed 9)  Ferrous Sulfate 325 (65 Fe) Mg Tbec (Ferrous Sulfate) .... Take 2 Tablets By Mouth Once Daily 10)  Hydrocodone-Acetaminophen 5-325 Mg Tabs (Hydrocodone-Acetaminophen) .... As Needed 11)  Dexilant 60 Mg Cpdr (Dexlansoprazole) .... As Needed  Allergies (verified): 1)  ! 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 Her father died at age 27 from complications of a stroke due to  hypertension.  History of hypertenision Mother died at age 83 from complications of breast cancer.   She has 4 sisters.  One died of an asthma attack.  Family history is significant for diabetes, strokes, hypertension,   cancer,  anemia, and asthma.   No family history of kidney disease.  Family History of Ovarian Cancer: Sister  Social History: Single  Tobacco Use - Former.   Quit in Jan 2011. previous 1/2 ppd for 32 yrs. Alcohol Use - no Drug Use - remote marijuana. Daily Caffeine Use 1  Review of Systems       The patient complains of itching, night sweats, shortness of breath, and swelling of feet/legs.         All other ROS negative except as per HPI.   Vital Signs:  Patient profile:   55 year old female Height:      63 inches Weight:      168.25 pounds BMI:     29.91  Vitals Entered By: June McMurray CMA Duncan Dull) (July 23, 2010 1:41 PM)  Physical Exam  General:  Well developed, well nourished, no acute distress. Eyes:  PERRLA, no icterus. Mouth:  No deformity or lesions, in mouth or pharynx Neck:  Supple; no masses or thyromegaly. Lungs:  Clear throughout to auscultation. Heart:  Regular rate and rhythm; no murmurs, rubs,  or bruits. Distant. Abdomen:  Firm, tender RUQ with area in RUQ and R mid quadrant. ? if liver - doubt. No splash no other masses BS+ Extremities:  no edema Neurologic:  Alert and  oriented x4;  grossly normal neurologically. Cervical Nodes:  No significant cervical or supraclavicular adenopathy.  Psych:  Alert and cooperative. Normal mood and affect.  Hospital records from [facility] reviewed, CT scan images reviewed with patient, [facility] records requested.  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 Leone Payor will be available if needed but your ovarian mass must be the cause of your vomiting ad abdominal pain.  i expect your problems to resolve after the tumor is removed. 2)  Please schedule a follow-up appointment as needed.  3)  You should have a routine colonoscopy in 2014, sooner if otherwise indicated (if problems). 4)  Copy sent to : Bertram Savin, MD; Ronita Hipps, MD 5)  The medication list was reviewed and reconciled.  All changed / newly prescribed medications were explained.  A complete medication list was provided to the patient / caregiver.

## 2011-02-03 ENCOUNTER — Other Ambulatory Visit (HOSPITAL_COMMUNITY): Payer: Self-pay

## 2011-02-04 ENCOUNTER — Other Ambulatory Visit (HOSPITAL_COMMUNITY): Payer: Self-pay

## 2011-03-12 LAB — RENAL FUNCTION PANEL
BUN: 11 mg/dL (ref 6–23)
CO2: 31 mEq/L (ref 19–32)
Glucose, Bld: 109 mg/dL — ABNORMAL HIGH (ref 70–99)
Potassium: 4.6 mEq/L (ref 3.5–5.1)
Sodium: 140 mEq/L (ref 135–145)

## 2011-03-12 LAB — GLUCOSE, CAPILLARY
Glucose-Capillary: 218 mg/dL — ABNORMAL HIGH (ref 70–99)
Glucose-Capillary: 64 mg/dL — ABNORMAL LOW (ref 70–99)

## 2011-03-12 LAB — CBC
HCT: 27.8 % — ABNORMAL LOW (ref 36.0–46.0)
Hemoglobin: 8.3 g/dL — ABNORMAL LOW (ref 12.0–15.0)
MCV: 86.1 fL (ref 78.0–100.0)
MCV: 89.7 fL (ref 78.0–100.0)
Platelets: 598 10*3/uL — ABNORMAL HIGH (ref 150–400)
RBC: 4.25 MIL/uL (ref 3.87–5.11)
RDW: 17.2 % — ABNORMAL HIGH (ref 11.5–15.5)
WBC: 11.6 10*3/uL — ABNORMAL HIGH (ref 4.0–10.5)
WBC: 13 10*3/uL — ABNORMAL HIGH (ref 4.0–10.5)

## 2011-03-12 LAB — IRON AND TIBC
Iron: 17 ug/dL — ABNORMAL LOW (ref 42–135)
Saturation Ratios: 8 % — ABNORMAL LOW (ref 20–55)
TIBC: 208 ug/dL — ABNORMAL LOW (ref 250–470)
UIBC: 191 ug/dL

## 2011-03-12 LAB — DIFFERENTIAL
Basophils Absolute: 0.1 10*3/uL (ref 0.0–0.1)
Basophils Relative: 1 % (ref 0–1)
Eosinophils Relative: 3 % (ref 0–5)
Lymphocytes Relative: 21 % (ref 12–46)
Monocytes Absolute: 0.7 10*3/uL (ref 0.1–1.0)
Monocytes Relative: 6 % (ref 3–12)
Neutro Abs: 8 10*3/uL — ABNORMAL HIGH (ref 1.7–7.7)

## 2011-03-12 LAB — COMPREHENSIVE METABOLIC PANEL
ALT: 14 U/L (ref 0–35)
AST: 30 U/L (ref 0–37)
Albumin: 2.9 g/dL — ABNORMAL LOW (ref 3.5–5.2)
CO2: 29 mEq/L (ref 19–32)
Chloride: 100 mEq/L (ref 96–112)
Creatinine, Ser: 2.59 mg/dL — ABNORMAL HIGH (ref 0.4–1.2)
GFR calc non Af Amer: 19 mL/min — ABNORMAL LOW (ref >60)
Sodium: 138 mEq/L (ref 135–145)
Total Bilirubin: 0.5 mg/dL (ref 0.3–1.2)

## 2011-03-12 LAB — FERRITIN: Ferritin: 432 ng/mL — ABNORMAL HIGH (ref 10–291)

## 2011-03-13 LAB — RENAL FUNCTION PANEL
Albumin: 2.8 g/dL — ABNORMAL LOW (ref 3.5–5.2)
BUN: 15 mg/dL (ref 6–23)
Chloride: 104 mEq/L (ref 96–112)
Creatinine, Ser: 4.45 mg/dL — ABNORMAL HIGH (ref 0.4–1.2)
Phosphorus: 3.3 mg/dL (ref 2.3–4.6)

## 2011-03-13 LAB — APTT: aPTT: 28 seconds (ref 24–37)

## 2011-03-13 LAB — COMPREHENSIVE METABOLIC PANEL
BUN: 9 mg/dL (ref 6–23)
Calcium: 9.9 mg/dL (ref 8.4–10.5)
Glucose, Bld: 98 mg/dL (ref 70–99)
Sodium: 139 mEq/L (ref 135–145)
Total Protein: 7.2 g/dL (ref 6.0–8.3)

## 2011-03-13 LAB — CBC
HCT: 42 % (ref 36.0–46.0)
MCHC: 32.2 g/dL (ref 30.0–36.0)
MCV: 87.9 fL (ref 78.0–100.0)
RDW: 17.8 % — ABNORMAL HIGH (ref 11.5–15.5)

## 2011-03-13 LAB — URINALYSIS, ROUTINE W REFLEX MICROSCOPIC
Ketones, ur: 15 mg/dL — AB
Nitrite: NEGATIVE
Protein, ur: >300 mg/dL — AB
pH: 5.5 (ref 5.0–8.0)

## 2011-03-13 LAB — URINE CULTURE: Colony Count: 3000

## 2011-03-13 LAB — DIFFERENTIAL
Lymphocytes Relative: 15 % (ref 12–46)
Lymphs Abs: 2.1 10*3/uL (ref 0.7–4.0)
Monocytes Relative: 9 % (ref 3–12)
Neutro Abs: 9.6 10*3/uL — ABNORMAL HIGH (ref 1.7–7.7)
Neutrophils Relative %: 71 % (ref 43–77)

## 2011-03-13 LAB — URINE MICROSCOPIC-ADD ON

## 2011-03-13 LAB — PROTIME-INR: INR: 1 (ref 0.00–1.49)

## 2011-03-13 LAB — HEMOCCULT GUIAC POC 1CARD (OFFICE): Fecal Occult Bld: NEGATIVE

## 2011-03-13 LAB — MRSA PCR SCREENING: MRSA by PCR: NEGATIVE

## 2011-03-14 LAB — RENAL FUNCTION PANEL
Albumin: 1.1 g/dL — CL (ref 3.5–5.2)
Albumin: 1.2 g/dL — CL (ref 3.5–5.2)
Albumin: 1.3 g/dL — CL (ref 3.5–5.2)
Albumin: 1.4 g/dL — ABNORMAL LOW (ref 3.5–5.2)
BUN: 32 mg/dL — ABNORMAL HIGH (ref 6–23)
BUN: 39 mg/dL — ABNORMAL HIGH (ref 6–23)
BUN: 40 mg/dL — ABNORMAL HIGH (ref 6–23)
BUN: 67 mg/dL — ABNORMAL HIGH (ref 6–23)
BUN: 69 mg/dL — ABNORMAL HIGH (ref 6–23)
BUN: 80 mg/dL — ABNORMAL HIGH (ref 6–23)
CO2: 18 mEq/L — ABNORMAL LOW (ref 19–32)
CO2: 19 mEq/L (ref 19–32)
CO2: 20 mEq/L (ref 19–32)
CO2: 22 mEq/L (ref 19–32)
CO2: 22 mEq/L (ref 19–32)
Calcium: 6.9 mg/dL — ABNORMAL LOW (ref 8.4–10.5)
Calcium: 7.3 mg/dL — ABNORMAL LOW (ref 8.4–10.5)
Calcium: 7.5 mg/dL — ABNORMAL LOW (ref 8.4–10.5)
Calcium: 7.9 mg/dL — ABNORMAL LOW (ref 8.4–10.5)
Chloride: 101 mEq/L (ref 96–112)
Chloride: 111 mEq/L (ref 96–112)
Chloride: 112 mEq/L (ref 96–112)
Chloride: 113 mEq/L — ABNORMAL HIGH (ref 96–112)
Chloride: 114 mEq/L — ABNORMAL HIGH (ref 96–112)
Creatinine, Ser: 3.81 mg/dL — ABNORMAL HIGH (ref 0.4–1.2)
Creatinine, Ser: 4.81 mg/dL — ABNORMAL HIGH (ref 0.4–1.2)
Creatinine, Ser: 5.12 mg/dL — ABNORMAL HIGH (ref 0.4–1.2)
GFR calc Af Amer: 10 mL/min — ABNORMAL LOW (ref >60)
GFR calc Af Amer: 11 mL/min — ABNORMAL LOW (ref >60)
GFR calc Af Amer: 11 mL/min — ABNORMAL LOW (ref >60)
GFR calc Af Amer: 11 mL/min — ABNORMAL LOW (ref >60)
GFR calc non Af Amer: 16 mL/min — ABNORMAL LOW (ref >60)
GFR calc non Af Amer: 8 mL/min — ABNORMAL LOW (ref >60)
GFR calc non Af Amer: 9 mL/min — ABNORMAL LOW (ref >60)
GFR calc non Af Amer: 9 mL/min — ABNORMAL LOW (ref >60)
GFR calc non Af Amer: 9 mL/min — ABNORMAL LOW (ref >60)
Glucose, Bld: 354 mg/dL — ABNORMAL HIGH (ref 70–99)
Glucose, Bld: 61 mg/dL — ABNORMAL LOW (ref 70–99)
Glucose, Bld: 92 mg/dL (ref 70–99)
Phosphorus: 5.2 mg/dL — ABNORMAL HIGH (ref 2.3–4.6)
Phosphorus: 6 mg/dL — ABNORMAL HIGH (ref 2.3–4.6)
Phosphorus: 6.9 mg/dL — ABNORMAL HIGH (ref 2.3–4.6)
Phosphorus: 7.2 mg/dL — ABNORMAL HIGH (ref 2.3–4.6)
Potassium: 2.7 mEq/L — CL (ref 3.5–5.1)
Potassium: 3.1 mEq/L — ABNORMAL LOW (ref 3.5–5.1)
Potassium: 4 mEq/L (ref 3.5–5.1)
Potassium: 4.1 mEq/L (ref 3.5–5.1)
Sodium: 138 mEq/L (ref 135–145)
Sodium: 139 mEq/L (ref 135–145)
Sodium: 144 mEq/L (ref 135–145)

## 2011-03-14 LAB — COMPREHENSIVE METABOLIC PANEL
ALT: 68 U/L — ABNORMAL HIGH (ref 0–35)
AST: 33 U/L (ref 0–37)
Albumin: 1.6 g/dL — ABNORMAL LOW (ref 3.5–5.2)
Alkaline Phosphatase: 116 U/L (ref 39–117)
Alkaline Phosphatase: 180 U/L — ABNORMAL HIGH (ref 39–117)
BUN: 43 mg/dL — ABNORMAL HIGH (ref 6–23)
CO2: 22 mEq/L (ref 19–32)
Calcium: 7.4 mg/dL — ABNORMAL LOW (ref 8.4–10.5)
Chloride: 101 mEq/L (ref 96–112)
Creatinine, Ser: 4.35 mg/dL — ABNORMAL HIGH (ref 0.4–1.2)
GFR calc Af Amer: 11 mL/min — ABNORMAL LOW (ref >60)
GFR calc non Af Amer: 11 mL/min — ABNORMAL LOW (ref >60)
Glucose, Bld: 96 mg/dL (ref 70–99)
Potassium: 3.6 mEq/L (ref 3.5–5.1)
Potassium: 5.3 mEq/L — ABNORMAL HIGH (ref 3.5–5.1)
Sodium: 146 mEq/L — ABNORMAL HIGH (ref 135–145)
Total Bilirubin: 0.5 mg/dL (ref 0.3–1.2)
Total Protein: 4.6 g/dL — ABNORMAL LOW (ref 6.0–8.3)

## 2011-03-14 LAB — BLOOD GAS, ARTERIAL
Acid-base deficit: 0 mmol/L (ref 0.0–2.0)
Drawn by: 277341
FIO2: 1 %
Patient temperature: 98.6
TCO2: 24.6 mmol/L (ref 0–100)
pCO2 arterial: 34.5 mmHg — ABNORMAL LOW (ref 35.0–45.0)
pH, Arterial: 7.448 — ABNORMAL HIGH (ref 7.350–7.400)

## 2011-03-14 LAB — CBC
HCT: 25.6 % — ABNORMAL LOW (ref 36.0–46.0)
HCT: 26.8 % — ABNORMAL LOW (ref 36.0–46.0)
HCT: 28 % — ABNORMAL LOW (ref 36.0–46.0)
HCT: 28.9 % — ABNORMAL LOW (ref 36.0–46.0)
HCT: 32.3 % — ABNORMAL LOW (ref 36.0–46.0)
Hemoglobin: 10 g/dL — ABNORMAL LOW (ref 12.0–15.0)
Hemoglobin: 10.1 g/dL — ABNORMAL LOW (ref 12.0–15.0)
Hemoglobin: 11.2 g/dL — ABNORMAL LOW (ref 12.0–15.0)
Hemoglobin: 9.3 g/dL — ABNORMAL LOW (ref 12.0–15.0)
Hemoglobin: 9.5 g/dL — ABNORMAL LOW (ref 12.0–15.0)
Hemoglobin: 9.5 g/dL — ABNORMAL LOW (ref 12.0–15.0)
MCHC: 34.5 g/dL (ref 30.0–36.0)
MCHC: 34.6 g/dL (ref 30.0–36.0)
MCHC: 35.1 g/dL (ref 30.0–36.0)
MCV: 93.8 fL (ref 78.0–100.0)
MCV: 94.8 fL (ref 78.0–100.0)
MCV: 95.6 fL (ref 78.0–100.0)
MCV: 95.9 fL (ref 78.0–100.0)
Platelets: 223 10*3/uL (ref 150–400)
Platelets: 227 10*3/uL (ref 150–400)
Platelets: 257 10*3/uL (ref 150–400)
Platelets: 283 10*3/uL (ref 150–400)
Platelets: 293 10*3/uL (ref 150–400)
Platelets: 310 10*3/uL (ref 150–400)
RBC: 2.79 MIL/uL — ABNORMAL LOW (ref 3.87–5.11)
RBC: 2.8 MIL/uL — ABNORMAL LOW (ref 3.87–5.11)
RBC: 2.89 MIL/uL — ABNORMAL LOW (ref 3.87–5.11)
RBC: 2.9 MIL/uL — ABNORMAL LOW (ref 3.87–5.11)
RBC: 2.98 MIL/uL — ABNORMAL LOW (ref 3.87–5.11)
RBC: 3.42 MIL/uL — ABNORMAL LOW (ref 3.87–5.11)
RDW: 15.1 % (ref 11.5–15.5)
RDW: 15.1 % (ref 11.5–15.5)
RDW: 15.6 % — ABNORMAL HIGH (ref 11.5–15.5)
RDW: 15.7 % — ABNORMAL HIGH (ref 11.5–15.5)
RDW: 15.8 % — ABNORMAL HIGH (ref 11.5–15.5)
RDW: 15.9 % — ABNORMAL HIGH (ref 11.5–15.5)
RDW: 16.7 % — ABNORMAL HIGH (ref 11.5–15.5)
RDW: 16.7 % — ABNORMAL HIGH (ref 11.5–15.5)
RDW: 17.4 % — ABNORMAL HIGH (ref 11.5–15.5)
WBC: 12.8 10*3/uL — ABNORMAL HIGH (ref 4.0–10.5)
WBC: 16.7 10*3/uL — ABNORMAL HIGH (ref 4.0–10.5)
WBC: 17.1 10*3/uL — ABNORMAL HIGH (ref 4.0–10.5)
WBC: 18.3 10*3/uL — ABNORMAL HIGH (ref 4.0–10.5)
WBC: 18.5 10*3/uL — ABNORMAL HIGH (ref 4.0–10.5)
WBC: 18.8 10*3/uL — ABNORMAL HIGH (ref 4.0–10.5)

## 2011-03-14 LAB — GLUCOSE, CAPILLARY
Glucose-Capillary: 100 mg/dL — ABNORMAL HIGH (ref 70–99)
Glucose-Capillary: 104 mg/dL — ABNORMAL HIGH (ref 70–99)
Glucose-Capillary: 105 mg/dL — ABNORMAL HIGH (ref 70–99)
Glucose-Capillary: 105 mg/dL — ABNORMAL HIGH (ref 70–99)
Glucose-Capillary: 107 mg/dL — ABNORMAL HIGH (ref 70–99)
Glucose-Capillary: 116 mg/dL — ABNORMAL HIGH (ref 70–99)
Glucose-Capillary: 119 mg/dL — ABNORMAL HIGH (ref 70–99)
Glucose-Capillary: 120 mg/dL — ABNORMAL HIGH (ref 70–99)
Glucose-Capillary: 125 mg/dL — ABNORMAL HIGH (ref 70–99)
Glucose-Capillary: 126 mg/dL — ABNORMAL HIGH (ref 70–99)
Glucose-Capillary: 133 mg/dL — ABNORMAL HIGH (ref 70–99)
Glucose-Capillary: 155 mg/dL — ABNORMAL HIGH (ref 70–99)
Glucose-Capillary: 159 mg/dL — ABNORMAL HIGH (ref 70–99)
Glucose-Capillary: 162 mg/dL — ABNORMAL HIGH (ref 70–99)
Glucose-Capillary: 164 mg/dL — ABNORMAL HIGH (ref 70–99)
Glucose-Capillary: 166 mg/dL — ABNORMAL HIGH (ref 70–99)
Glucose-Capillary: 166 mg/dL — ABNORMAL HIGH (ref 70–99)
Glucose-Capillary: 173 mg/dL — ABNORMAL HIGH (ref 70–99)
Glucose-Capillary: 183 mg/dL — ABNORMAL HIGH (ref 70–99)
Glucose-Capillary: 217 mg/dL — ABNORMAL HIGH (ref 70–99)
Glucose-Capillary: 223 mg/dL — ABNORMAL HIGH (ref 70–99)
Glucose-Capillary: 242 mg/dL — ABNORMAL HIGH (ref 70–99)
Glucose-Capillary: 257 mg/dL — ABNORMAL HIGH (ref 70–99)
Glucose-Capillary: 301 mg/dL — ABNORMAL HIGH (ref 70–99)
Glucose-Capillary: 55 mg/dL — ABNORMAL LOW (ref 70–99)
Glucose-Capillary: 60 mg/dL — ABNORMAL LOW (ref 70–99)
Glucose-Capillary: 60 mg/dL — ABNORMAL LOW (ref 70–99)
Glucose-Capillary: 65 mg/dL — ABNORMAL LOW (ref 70–99)
Glucose-Capillary: 68 mg/dL — ABNORMAL LOW (ref 70–99)
Glucose-Capillary: 68 mg/dL — ABNORMAL LOW (ref 70–99)
Glucose-Capillary: 74 mg/dL (ref 70–99)
Glucose-Capillary: 79 mg/dL (ref 70–99)
Glucose-Capillary: 90 mg/dL (ref 70–99)
Glucose-Capillary: 92 mg/dL (ref 70–99)
Glucose-Capillary: 99 mg/dL (ref 70–99)

## 2011-03-14 LAB — TYPE AND SCREEN
ABO/RH(D): O POS
Antibody Screen: NEGATIVE

## 2011-03-14 LAB — PHOSPHORUS: Phosphorus: 5.9 mg/dL — ABNORMAL HIGH (ref 2.3–4.6)

## 2011-03-14 LAB — HEPATITIS B SURFACE ANTIGEN: Hepatitis B Surface Ag: NEGATIVE

## 2011-03-14 LAB — BASIC METABOLIC PANEL
Calcium: 7.2 mg/dL — ABNORMAL LOW (ref 8.4–10.5)
GFR calc non Af Amer: 10 mL/min — ABNORMAL LOW (ref >60)
Glucose, Bld: 133 mg/dL — ABNORMAL HIGH (ref 70–99)
Sodium: 141 mEq/L (ref 135–145)

## 2011-03-14 LAB — HEMOGLOBIN AND HEMATOCRIT, BLOOD
HCT: 27.1 % — ABNORMAL LOW (ref 36.0–46.0)
Hemoglobin: 9.4 g/dL — ABNORMAL LOW (ref 12.0–15.0)

## 2011-03-14 LAB — TROPONIN I
Troponin I: 0.06 ng/mL (ref 0.00–0.06)
Troponin I: 0.11 ng/mL — ABNORMAL HIGH (ref 0.00–0.06)

## 2011-03-14 LAB — CLOSTRIDIUM DIFFICILE EIA: C difficile Toxins A+B, EIA: NEGATIVE

## 2011-03-14 LAB — IRON AND TIBC: Iron: 56 ug/dL (ref 42–135)

## 2011-03-14 LAB — DIFFERENTIAL
Basophils Relative: 0 % (ref 0–1)
Eosinophils Absolute: 0 10*3/uL (ref 0.0–0.7)
Lymphs Abs: 0.8 10*3/uL (ref 0.7–4.0)
Monocytes Absolute: 0.3 10*3/uL (ref 0.1–1.0)
Monocytes Relative: 2 % — ABNORMAL LOW (ref 3–12)

## 2011-03-14 LAB — CK TOTAL AND CKMB (NOT AT ARMC)
CK, MB: 1.8 ng/mL (ref 0.3–4.0)
CK, MB: 2.2 ng/mL (ref 0.3–4.0)
CK, MB: 3.4 ng/mL (ref 0.3–4.0)
Total CK: 23 U/L (ref 7–177)

## 2011-03-16 LAB — POCT I-STAT, CHEM 8
Calcium, Ion: 1.2 mmol/L (ref 1.12–1.32)
Creatinine, Ser: 8.3 mg/dL — ABNORMAL HIGH (ref 0.4–1.2)
Glucose, Bld: 102 mg/dL — ABNORMAL HIGH (ref 70–99)
HCT: 35 % — ABNORMAL LOW (ref 36.0–46.0)
Hemoglobin: 11.9 g/dL — ABNORMAL LOW (ref 12.0–15.0)
Potassium: 2.9 mEq/L — ABNORMAL LOW (ref 3.5–5.1)
TCO2: 32 mmol/L (ref 0–100)

## 2011-03-24 ENCOUNTER — Other Ambulatory Visit: Payer: Self-pay | Admitting: Nephrology

## 2011-03-24 DIAGNOSIS — Z1231 Encounter for screening mammogram for malignant neoplasm of breast: Secondary | ICD-10-CM

## 2011-03-29 LAB — DIFFERENTIAL
Basophils Absolute: 0 10*3/uL (ref 0.0–0.1)
Basophils Absolute: 0 10*3/uL (ref 0.0–0.1)
Basophils Relative: 0 % (ref 0–1)
Basophils Relative: 0 % (ref 0–1)
Eosinophils Absolute: 0 10*3/uL (ref 0.0–0.7)
Eosinophils Relative: 0 % (ref 0–5)
Lymphs Abs: 0.4 10*3/uL — ABNORMAL LOW (ref 0.7–4.0)
Monocytes Absolute: 0.2 10*3/uL (ref 0.1–1.0)
Monocytes Absolute: 0.7 10*3/uL (ref 0.1–1.0)
Neutro Abs: 15.9 10*3/uL — ABNORMAL HIGH (ref 1.7–7.7)
Neutrophils Relative %: 90 % — ABNORMAL HIGH (ref 43–77)
Neutrophils Relative %: 97 % — ABNORMAL HIGH (ref 43–77)

## 2011-03-29 LAB — GLUCOSE, CAPILLARY
Glucose-Capillary: 104 mg/dL — ABNORMAL HIGH (ref 70–99)
Glucose-Capillary: 106 mg/dL — ABNORMAL HIGH (ref 70–99)
Glucose-Capillary: 109 mg/dL — ABNORMAL HIGH (ref 70–99)
Glucose-Capillary: 114 mg/dL — ABNORMAL HIGH (ref 70–99)
Glucose-Capillary: 118 mg/dL — ABNORMAL HIGH (ref 70–99)
Glucose-Capillary: 123 mg/dL — ABNORMAL HIGH (ref 70–99)
Glucose-Capillary: 124 mg/dL — ABNORMAL HIGH (ref 70–99)
Glucose-Capillary: 134 mg/dL — ABNORMAL HIGH (ref 70–99)
Glucose-Capillary: 136 mg/dL — ABNORMAL HIGH (ref 70–99)
Glucose-Capillary: 136 mg/dL — ABNORMAL HIGH (ref 70–99)
Glucose-Capillary: 144 mg/dL — ABNORMAL HIGH (ref 70–99)
Glucose-Capillary: 145 mg/dL — ABNORMAL HIGH (ref 70–99)
Glucose-Capillary: 154 mg/dL — ABNORMAL HIGH (ref 70–99)
Glucose-Capillary: 159 mg/dL — ABNORMAL HIGH (ref 70–99)
Glucose-Capillary: 159 mg/dL — ABNORMAL HIGH (ref 70–99)
Glucose-Capillary: 195 mg/dL — ABNORMAL HIGH (ref 70–99)
Glucose-Capillary: 206 mg/dL — ABNORMAL HIGH (ref 70–99)
Glucose-Capillary: 216 mg/dL — ABNORMAL HIGH (ref 70–99)
Glucose-Capillary: 231 mg/dL — ABNORMAL HIGH (ref 70–99)
Glucose-Capillary: 66 mg/dL — ABNORMAL LOW (ref 70–99)
Glucose-Capillary: 76 mg/dL (ref 70–99)
Glucose-Capillary: 77 mg/dL (ref 70–99)
Glucose-Capillary: 89 mg/dL (ref 70–99)
Glucose-Capillary: 97 mg/dL (ref 70–99)

## 2011-03-29 LAB — CBC
HCT: 25.6 % — ABNORMAL LOW (ref 36.0–46.0)
HCT: 27.5 % — ABNORMAL LOW (ref 36.0–46.0)
HCT: 29.5 % — ABNORMAL LOW (ref 36.0–46.0)
HCT: 31 % — ABNORMAL LOW (ref 36.0–46.0)
HCT: 34 % — ABNORMAL LOW (ref 36.0–46.0)
HCT: 34.2 % — ABNORMAL LOW (ref 36.0–46.0)
HCT: 36.3 % (ref 36.0–46.0)
Hemoglobin: 10.9 g/dL — ABNORMAL LOW (ref 12.0–15.0)
Hemoglobin: 12.6 g/dL (ref 12.0–15.0)
Hemoglobin: 9.5 g/dL — ABNORMAL LOW (ref 12.0–15.0)
MCHC: 34.1 g/dL (ref 30.0–36.0)
MCHC: 34.7 g/dL (ref 30.0–36.0)
MCHC: 34.7 g/dL (ref 30.0–36.0)
MCV: 92.5 fL (ref 78.0–100.0)
MCV: 92.9 fL (ref 78.0–100.0)
MCV: 93.2 fL (ref 78.0–100.0)
MCV: 93.5 fL (ref 78.0–100.0)
MCV: 94.2 fL (ref 78.0–100.0)
Platelets: 212 10*3/uL (ref 150–400)
Platelets: 229 10*3/uL (ref 150–400)
Platelets: 259 10*3/uL (ref 150–400)
Platelets: 267 10*3/uL (ref 150–400)
Platelets: 295 10*3/uL (ref 150–400)
Platelets: 305 10*3/uL (ref 150–400)
Platelets: 308 10*3/uL (ref 150–400)
Platelets: 335 10*3/uL (ref 150–400)
RBC: 3.13 MIL/uL — ABNORMAL LOW (ref 3.87–5.11)
RBC: 3.2 MIL/uL — ABNORMAL LOW (ref 3.87–5.11)
RBC: 3.89 MIL/uL (ref 3.87–5.11)
RDW: 15 % (ref 11.5–15.5)
RDW: 15.1 % (ref 11.5–15.5)
RDW: 15.6 % — ABNORMAL HIGH (ref 11.5–15.5)
RDW: 16 % — ABNORMAL HIGH (ref 11.5–15.5)
RDW: 16.2 % — ABNORMAL HIGH (ref 11.5–15.5)
WBC: 15.2 10*3/uL — ABNORMAL HIGH (ref 4.0–10.5)
WBC: 15.8 10*3/uL — ABNORMAL HIGH (ref 4.0–10.5)
WBC: 16.8 10*3/uL — ABNORMAL HIGH (ref 4.0–10.5)
WBC: 17.6 10*3/uL — ABNORMAL HIGH (ref 4.0–10.5)
WBC: 17.7 10*3/uL — ABNORMAL HIGH (ref 4.0–10.5)
WBC: 18 10*3/uL — ABNORMAL HIGH (ref 4.0–10.5)
WBC: 21.5 10*3/uL — ABNORMAL HIGH (ref 4.0–10.5)
WBC: 22.8 10*3/uL — ABNORMAL HIGH (ref 4.0–10.5)

## 2011-03-29 LAB — URINE MICROSCOPIC-ADD ON

## 2011-03-29 LAB — COMPREHENSIVE METABOLIC PANEL
ALT: 148 U/L — ABNORMAL HIGH (ref 0–35)
ALT: 163 U/L — ABNORMAL HIGH (ref 0–35)
AST: 28 U/L (ref 0–37)
AST: 48 U/L — ABNORMAL HIGH (ref 0–37)
AST: 76 U/L — ABNORMAL HIGH (ref 0–37)
AST: 85 U/L — ABNORMAL HIGH (ref 0–37)
Albumin: 1.2 g/dL — ABNORMAL LOW (ref 3.5–5.2)
Albumin: 1.3 g/dL — ABNORMAL LOW (ref 3.5–5.2)
Albumin: 1.3 g/dL — ABNORMAL LOW (ref 3.5–5.2)
Albumin: 1.4 g/dL — ABNORMAL LOW (ref 3.5–5.2)
Albumin: 1.6 g/dL — ABNORMAL LOW (ref 3.5–5.2)
Albumin: 2 g/dL — ABNORMAL LOW (ref 3.5–5.2)
Alkaline Phosphatase: 107 U/L (ref 39–117)
Alkaline Phosphatase: 117 U/L (ref 39–117)
Alkaline Phosphatase: 122 U/L — ABNORMAL HIGH (ref 39–117)
Alkaline Phosphatase: 129 U/L — ABNORMAL HIGH (ref 39–117)
Alkaline Phosphatase: 96 U/L (ref 39–117)
BUN: 103 mg/dL — ABNORMAL HIGH (ref 6–23)
BUN: 105 mg/dL — ABNORMAL HIGH (ref 6–23)
BUN: 106 mg/dL — ABNORMAL HIGH (ref 6–23)
BUN: 110 mg/dL — ABNORMAL HIGH (ref 6–23)
BUN: 99 mg/dL — ABNORMAL HIGH (ref 6–23)
CO2: 12 mEq/L — ABNORMAL LOW (ref 19–32)
Calcium: 7.1 mg/dL — ABNORMAL LOW (ref 8.4–10.5)
Calcium: 7.3 mg/dL — ABNORMAL LOW (ref 8.4–10.5)
Calcium: 7.5 mg/dL — ABNORMAL LOW (ref 8.4–10.5)
Chloride: 110 mEq/L (ref 96–112)
Chloride: 110 mEq/L (ref 96–112)
Chloride: 110 mEq/L (ref 96–112)
Chloride: 112 mEq/L (ref 96–112)
Chloride: 114 mEq/L — ABNORMAL HIGH (ref 96–112)
Creatinine, Ser: 3.89 mg/dL — ABNORMAL HIGH (ref 0.4–1.2)
Creatinine, Ser: 4.35 mg/dL — ABNORMAL HIGH (ref 0.4–1.2)
GFR calc Af Amer: 12 mL/min — ABNORMAL LOW (ref >60)
GFR calc Af Amer: 13 mL/min — ABNORMAL LOW (ref >60)
GFR calc Af Amer: 14 mL/min — ABNORMAL LOW (ref >60)
GFR calc Af Amer: 15 mL/min — ABNORMAL LOW (ref >60)
GFR calc non Af Amer: 11 mL/min — ABNORMAL LOW (ref >60)
GFR calc non Af Amer: 12 mL/min — ABNORMAL LOW (ref >60)
Glucose, Bld: 110 mg/dL — ABNORMAL HIGH (ref 70–99)
Glucose, Bld: 146 mg/dL — ABNORMAL HIGH (ref 70–99)
Glucose, Bld: 146 mg/dL — ABNORMAL HIGH (ref 70–99)
Glucose, Bld: 148 mg/dL — ABNORMAL HIGH (ref 70–99)
Potassium: 2.9 mEq/L — ABNORMAL LOW (ref 3.5–5.1)
Potassium: 3 mEq/L — ABNORMAL LOW (ref 3.5–5.1)
Potassium: 3.8 mEq/L (ref 3.5–5.1)
Potassium: 5.1 mEq/L (ref 3.5–5.1)
Potassium: 6 mEq/L — ABNORMAL HIGH (ref 3.5–5.1)
Potassium: 6.2 mEq/L — ABNORMAL HIGH (ref 3.5–5.1)
Sodium: 134 mEq/L — ABNORMAL LOW (ref 135–145)
Sodium: 137 mEq/L (ref 135–145)
Total Bilirubin: 0.4 mg/dL (ref 0.3–1.2)
Total Bilirubin: 0.5 mg/dL (ref 0.3–1.2)
Total Bilirubin: 0.5 mg/dL (ref 0.3–1.2)
Total Bilirubin: 0.8 mg/dL (ref 0.3–1.2)
Total Protein: 4.1 g/dL — ABNORMAL LOW (ref 6.0–8.3)
Total Protein: 4.1 g/dL — ABNORMAL LOW (ref 6.0–8.3)
Total Protein: 4.2 g/dL — ABNORMAL LOW (ref 6.0–8.3)
Total Protein: 4.3 g/dL — ABNORMAL LOW (ref 6.0–8.3)
Total Protein: 4.4 g/dL — ABNORMAL LOW (ref 6.0–8.3)

## 2011-03-29 LAB — HEPATITIS C ANTIBODY: HCV Ab: NEGATIVE

## 2011-03-29 LAB — URINALYSIS, ROUTINE W REFLEX MICROSCOPIC
Bilirubin Urine: NEGATIVE
Bilirubin Urine: NEGATIVE
Glucose, UA: 100 mg/dL — AB
Ketones, ur: NEGATIVE mg/dL
Leukocytes, UA: NEGATIVE
Nitrite: NEGATIVE
Protein, ur: >300 mg/dL — AB
Specific Gravity, Urine: 1.015 (ref 1.005–1.030)
Specific Gravity, Urine: 1.025 (ref 1.005–1.030)
Urobilinogen, UA: 0.2 mg/dL (ref 0.0–1.0)
Urobilinogen, UA: 0.2 mg/dL (ref 0.0–1.0)
pH: 6 (ref 5.0–8.0)

## 2011-03-29 LAB — CULTURE, BLOOD (ROUTINE X 2)

## 2011-03-29 LAB — RENAL FUNCTION PANEL
Albumin: 1.3 g/dL — ABNORMAL LOW (ref 3.5–5.2)
Albumin: 1.8 g/dL — ABNORMAL LOW (ref 3.5–5.2)
BUN: 107 mg/dL — ABNORMAL HIGH (ref 6–23)
Calcium: 7.3 mg/dL — ABNORMAL LOW (ref 8.4–10.5)
Chloride: 116 mEq/L — ABNORMAL HIGH (ref 96–112)
GFR calc Af Amer: 12 mL/min — ABNORMAL LOW (ref >60)
GFR calc non Af Amer: 10 mL/min — ABNORMAL LOW (ref >60)
GFR calc non Af Amer: 12 mL/min — ABNORMAL LOW (ref >60)
Phosphorus: 10 mg/dL — ABNORMAL HIGH (ref 2.3–4.6)
Phosphorus: 9.4 mg/dL — ABNORMAL HIGH (ref 2.3–4.6)
Potassium: 2.9 mEq/L — ABNORMAL LOW (ref 3.5–5.1)
Potassium: 6.2 mEq/L — ABNORMAL HIGH (ref 3.5–5.1)
Sodium: 137 mEq/L (ref 135–145)
Sodium: 143 mEq/L (ref 135–145)

## 2011-03-29 LAB — BASIC METABOLIC PANEL
BUN: 88 mg/dL — ABNORMAL HIGH (ref 6–23)
Calcium: 6.9 mg/dL — ABNORMAL LOW (ref 8.4–10.5)
Chloride: 107 mEq/L (ref 96–112)
Chloride: 111 mEq/L (ref 96–112)
Creatinine, Ser: 4.82 mg/dL — ABNORMAL HIGH (ref 0.4–1.2)
GFR calc Af Amer: 11 mL/min — ABNORMAL LOW (ref >60)
Potassium: 3.3 mEq/L — ABNORMAL LOW (ref 3.5–5.1)

## 2011-03-29 LAB — CLOSTRIDIUM DIFFICILE EIA: C difficile Toxins A+B, EIA: NEGATIVE

## 2011-03-29 LAB — HEMOGLOBIN A1C
Hgb A1c MFr Bld: 7.7 % — ABNORMAL HIGH (ref 4.6–6.1)
Mean Plasma Glucose: 174 mg/dL

## 2011-03-29 LAB — URINE CULTURE
Colony Count: >=100000
Colony Count: >=100000

## 2011-03-29 LAB — PHOSPHORUS: Phosphorus: 9.4 mg/dL — ABNORMAL HIGH (ref 2.3–4.6)

## 2011-03-29 LAB — POTASSIUM: Potassium: 5 mEq/L (ref 3.5–5.1)

## 2011-03-31 LAB — RENAL FUNCTION PANEL
Albumin: 1.4 g/dL — ABNORMAL LOW (ref 3.5–5.2)
BUN: 86 mg/dL — ABNORMAL HIGH (ref 6–23)
CO2: 21 mEq/L (ref 19–32)
CO2: 33 mEq/L — ABNORMAL HIGH (ref 19–32)
Calcium: 7.9 mg/dL — ABNORMAL LOW (ref 8.4–10.5)
Chloride: 113 mEq/L — ABNORMAL HIGH (ref 96–112)
Chloride: 97 mEq/L (ref 96–112)
Creatinine, Ser: 2.17 mg/dL — ABNORMAL HIGH (ref 0.4–1.2)
GFR calc Af Amer: 29 mL/min — ABNORMAL LOW (ref >60)
GFR calc Af Amer: 33 mL/min — ABNORMAL LOW (ref >60)
GFR calc non Af Amer: 24 mL/min — ABNORMAL LOW (ref >60)
Glucose, Bld: 128 mg/dL — ABNORMAL HIGH (ref 70–99)
Glucose, Bld: 146 mg/dL — ABNORMAL HIGH (ref 70–99)
Phosphorus: 4.5 mg/dL (ref 2.3–4.6)
Potassium: 3.6 mEq/L (ref 3.5–5.1)
Potassium: 3.7 mEq/L (ref 3.5–5.1)
Sodium: 142 mEq/L (ref 135–145)

## 2011-03-31 LAB — COMPREHENSIVE METABOLIC PANEL
ALT: 29 U/L (ref 0–35)
AST: 17 U/L (ref 0–37)
AST: 19 U/L (ref 0–37)
AST: 20 U/L (ref 0–37)
Albumin: 1.3 g/dL — ABNORMAL LOW (ref 3.5–5.2)
Albumin: 1.6 g/dL — ABNORMAL LOW (ref 3.5–5.2)
Alkaline Phosphatase: 36 U/L — ABNORMAL LOW (ref 39–117)
Alkaline Phosphatase: 46 U/L (ref 39–117)
BUN: 64 mg/dL — ABNORMAL HIGH (ref 6–23)
CO2: 25 mEq/L (ref 19–32)
CO2: 26 mEq/L (ref 19–32)
CO2: 28 mEq/L (ref 19–32)
Calcium: 7.9 mg/dL — ABNORMAL LOW (ref 8.4–10.5)
Calcium: 8.3 mg/dL — ABNORMAL LOW (ref 8.4–10.5)
Chloride: 113 mEq/L — ABNORMAL HIGH (ref 96–112)
Creatinine, Ser: 1.94 mg/dL — ABNORMAL HIGH (ref 0.4–1.2)
Creatinine, Ser: 2.35 mg/dL — ABNORMAL HIGH (ref 0.4–1.2)
GFR calc Af Amer: 26 mL/min — ABNORMAL LOW (ref >60)
GFR calc Af Amer: 33 mL/min — ABNORMAL LOW (ref >60)
GFR calc non Af Amer: 22 mL/min — ABNORMAL LOW (ref >60)
GFR calc non Af Amer: 27 mL/min — ABNORMAL LOW (ref >60)
Glucose, Bld: 158 mg/dL — ABNORMAL HIGH (ref 70–99)
Glucose, Bld: 182 mg/dL — ABNORMAL HIGH (ref 70–99)
Potassium: 3.7 mEq/L (ref 3.5–5.1)
Potassium: 3.9 mEq/L (ref 3.5–5.1)
Sodium: 141 mEq/L (ref 135–145)
Total Bilirubin: 0.4 mg/dL (ref 0.3–1.2)
Total Protein: 4 g/dL — ABNORMAL LOW (ref 6.0–8.3)

## 2011-03-31 LAB — CBC
HCT: 34.1 % — ABNORMAL LOW (ref 36.0–46.0)
HCT: 35.5 % — ABNORMAL LOW (ref 36.0–46.0)
HCT: 37.1 % (ref 36.0–46.0)
HCT: 39.5 % (ref 36.0–46.0)
Hemoglobin: 11.7 g/dL — ABNORMAL LOW (ref 12.0–15.0)
Hemoglobin: 12.9 g/dL (ref 12.0–15.0)
Hemoglobin: 13.8 g/dL (ref 12.0–15.0)
MCHC: 34.1 g/dL (ref 30.0–36.0)
MCHC: 34.2 g/dL (ref 30.0–36.0)
MCHC: 34.4 g/dL (ref 30.0–36.0)
MCHC: 34.6 g/dL (ref 30.0–36.0)
MCV: 92.5 fL (ref 78.0–100.0)
MCV: 92.7 fL (ref 78.0–100.0)
MCV: 92.8 fL (ref 78.0–100.0)
MCV: 93.1 fL (ref 78.0–100.0)
Platelets: 317 10*3/uL (ref 150–400)
Platelets: 322 10*3/uL (ref 150–400)
Platelets: 357 10*3/uL (ref 150–400)
Platelets: 357 10*3/uL (ref 150–400)
Platelets: 369 10*3/uL (ref 150–400)
Platelets: 391 10*3/uL (ref 150–400)
RBC: 3.69 MIL/uL — ABNORMAL LOW (ref 3.87–5.11)
RBC: 4.02 MIL/uL (ref 3.87–5.11)
RDW: 14.9 % (ref 11.5–15.5)
RDW: 16 % — ABNORMAL HIGH (ref 11.5–15.5)
WBC: 10.7 10*3/uL — ABNORMAL HIGH (ref 4.0–10.5)
WBC: 11.5 10*3/uL — ABNORMAL HIGH (ref 4.0–10.5)
WBC: 11.8 10*3/uL — ABNORMAL HIGH (ref 4.0–10.5)
WBC: 13.4 10*3/uL — ABNORMAL HIGH (ref 4.0–10.5)
WBC: 14.8 10*3/uL — ABNORMAL HIGH (ref 4.0–10.5)
WBC: 18.9 10*3/uL — ABNORMAL HIGH (ref 4.0–10.5)

## 2011-03-31 LAB — PROTEIN, URINE, 24 HOUR
Collection Interval-UPROT: 24 hours
Protein, 24H Urine: 12330 mg/d — ABNORMAL HIGH (ref 50–100)
Protein, Urine: 685 mg/dL
Urine Total Volume-UPROT: 1800 mL

## 2011-03-31 LAB — DIFFERENTIAL
Basophils Absolute: 0 10*3/uL (ref 0.0–0.1)
Basophils Relative: 0 % (ref 0–1)
Eosinophils Absolute: 0 10*3/uL (ref 0.0–0.7)
Eosinophils Relative: 0 % (ref 0–5)

## 2011-03-31 LAB — ANTI-DNA ANTIBODY, DOUBLE-STRANDED: ds DNA Ab: <1 IU/mL (ref <5)

## 2011-03-31 LAB — PROTIME-INR: Prothrombin Time: 13 seconds (ref 11.6–15.2)

## 2011-03-31 LAB — BASIC METABOLIC PANEL
BUN: 77 mg/dL — ABNORMAL HIGH (ref 6–23)
Creatinine, Ser: 2.24 mg/dL — ABNORMAL HIGH (ref 0.4–1.2)
GFR calc non Af Amer: 23 mL/min — ABNORMAL LOW (ref >60)

## 2011-03-31 LAB — C4 COMPLEMENT: Complement C4, Body Fluid: 24 mg/dL (ref 16–47)

## 2011-03-31 LAB — ANA: Anti Nuclear Antibody(ANA): NEGATIVE

## 2011-04-01 ENCOUNTER — Other Ambulatory Visit (HOSPITAL_COMMUNITY): Payer: Self-pay | Admitting: Nephrology

## 2011-04-01 DIAGNOSIS — N186 End stage renal disease: Secondary | ICD-10-CM

## 2011-04-03 LAB — COMPREHENSIVE METABOLIC PANEL
AST: 20 U/L (ref 0–37)
Albumin: 2 g/dL — ABNORMAL LOW (ref 3.5–5.2)
Calcium: 8.4 mg/dL (ref 8.4–10.5)
Chloride: 107 mEq/L (ref 96–112)
Creatinine, Ser: 1.43 mg/dL — ABNORMAL HIGH (ref 0.4–1.2)
GFR calc Af Amer: 46 mL/min — ABNORMAL LOW (ref >60)
Total Bilirubin: 0.3 mg/dL (ref 0.3–1.2)
Total Protein: 5.5 g/dL — ABNORMAL LOW (ref 6.0–8.3)

## 2011-04-03 LAB — URINALYSIS, ROUTINE W REFLEX MICROSCOPIC
Glucose, UA: NEGATIVE mg/dL
Specific Gravity, Urine: 1.02 (ref 1.005–1.030)
Urobilinogen, UA: 0.2 mg/dL (ref 0.0–1.0)

## 2011-04-03 LAB — DIFFERENTIAL
Eosinophils Relative: 2 % (ref 0–5)
Lymphocytes Relative: 15 % (ref 12–46)
Lymphs Abs: 2.1 10*3/uL (ref 0.7–4.0)
Monocytes Absolute: 1 10*3/uL (ref 0.1–1.0)
Monocytes Relative: 7 % (ref 3–12)

## 2011-04-03 LAB — CBC
MCV: 90.1 fL (ref 78.0–100.0)
Platelets: 422 10*3/uL — ABNORMAL HIGH (ref 150–400)
WBC: 13.8 10*3/uL — ABNORMAL HIGH (ref 4.0–10.5)

## 2011-04-03 LAB — URINE MICROSCOPIC-ADD ON

## 2011-04-03 LAB — POCT CARDIAC MARKERS: Troponin i, poc: <0.05 ng/mL (ref 0.00–0.09)

## 2011-04-06 ENCOUNTER — Ambulatory Visit
Admission: RE | Admit: 2011-04-06 | Discharge: 2011-04-06 | Disposition: A | Discharge status: Home / Self Care | Payer: 59 | Source: Ambulatory Visit | Attending: Nephrology | Admitting: Nephrology

## 2011-04-06 DIAGNOSIS — Z1231 Encounter for screening mammogram for malignant neoplasm of breast: Secondary | ICD-10-CM

## 2011-04-08 ENCOUNTER — Other Ambulatory Visit (HOSPITAL_COMMUNITY): Payer: Self-pay | Admitting: Nephrology

## 2011-04-08 ENCOUNTER — Ambulatory Visit (HOSPITAL_COMMUNITY)
Admission: RE | Admit: 2011-04-08 | Discharge: 2011-04-08 | Disposition: A | Discharge status: Home / Self Care | Payer: 59 | Source: Ambulatory Visit | Attending: Nephrology | Admitting: Nephrology

## 2011-04-08 DIAGNOSIS — N186 End stage renal disease: Secondary | ICD-10-CM

## 2011-04-08 DIAGNOSIS — Y832 Surgical operation with anastomosis, bypass or graft as the cause of abnormal reaction of the patient, or of later complication, without mention of misadventure at the time of the procedure: Secondary | ICD-10-CM | POA: Insufficient documentation

## 2011-04-08 DIAGNOSIS — I120 Hypertensive chronic kidney disease with stage 5 chronic kidney disease or end stage renal disease: Secondary | ICD-10-CM | POA: Insufficient documentation

## 2011-04-08 DIAGNOSIS — T82898A Other specified complication of vascular prosthetic devices, implants and grafts, initial encounter: Secondary | ICD-10-CM | POA: Insufficient documentation

## 2011-04-08 MED ORDER — IOHEXOL 300 MG/ML  SOLN
100.0000 mL | Freq: Once | INTRAMUSCULAR | Status: AC | PRN
  Administered 2011-04-08: 40 mL via INTRAVENOUS

## 2011-05-11 NOTE — Discharge Summary (Signed)
NAME:  Kim Hodge, Kim Hodge                ACCOUNT NO.:  [id number]   MEDICAL RECORD NO.:  [id number]          PATIENT TYPE:  OIB   LOCATION:  6740                         FACILITY:  [facility]   PHYSICIAN:  Terrial Rhodes, M.D.DATE OF BIRTH:  Mar 19, 1956   DATE OF ADMISSION:  07/26/2008  DATE OF DISCHARGE:  07/27/2008                               DISCHARGE SUMMARY   REASON FOR ADMISSION:  Renal biopsy, 23-hour observations post biopsy.   REASON FOR BIOPSY:  Nephrotic syndrome.   DIAGNOSES:  1. Nephrotic syndrome.  2. Hypertension.   HOSPITAL COURSE:  The patient was brought to Ultrasound and after  informed consent was obtained renal biopsy was taken from her right  kidney and sent for pathology to [facility].  She was admitted to 6700  and was monitored.  She did not have any significant drops in her  hemoglobin.  She did not develop hematuria and overall was stable for  discharge on July 27, 2008.   DISCHARGE MEDICATIONS:  1. Norvasc 10 mg a day to start.  2. Percocet 10/650 every 6 hours as needed for pain.  3. Avalide 300/12.5 one a day.  4. Tekturna 150 mg one a day.  5. Lipitor 40 mg a day.  6. Torsemide 20 mg a day.  7. Clonidine 0.1 mg at bedtime.   DISCHARGE INSTRUCTIONS:  She is not to perform any heavy lifting or  climbing ladder for the next 2 weeks.  She is stable to return to work  on light duty on Monday and she will follow up with our office at  [employer].           ______________________________  Terrial Rhodes, M.D.     JC/MEDQ  D:  07/27/2008  T:  07/27/2008  Job:  [id number]

## 2011-05-11 NOTE — Assessment & Plan Note (Signed)
OFFICE VISIT   MINNETTE, MERIDA  DOB:  1956/08/28                                       02/26/2010  GNFAO#:[id number]   DATE OF SURGERY:  01/02/2010.   She had a left upper arm AV fistula placed.   HISTORY OF PRESENT ILLNESS:  Patient is a 55 year old woman who has been  dialyzing through a right IJ hemodialysis catheter on Monday, Wednesday  and Friday.  She had a left upper arm AV fistula placed on 01/02/2010.  She has had no issues with this except for some slight numbness and  tingling in the left hand when she is on hemodialysis, but she denies  any pain or other symptoms of steal when off hemodialysis.  She has good  motion and can use her hand normally.   Her chronic medical problems of hypertension, hypercholesterolemia have  been stable.  She also has a history of uterine fibrosis.  She has no  other new issues.   PHYSICAL EXAMINATION:  A well-developed, well-nourished woman in no  acute distress.  Her heart rate is 87.  Her sats are 100%.  Respiratory  rate is 12.  Her left upper extremity:  She has a good thrill and bruit  up into the upper arm about 3 quarters of the way up towards the axilla.  She has a 2+ radial pulse, and the hand is warm and pink with good  capillary refill.  The vein is easily palpable in the left upper arm to  the mid humeral area, but a thrill can be felt beyond this.  The wounds  were healing well.   ASSESSMENT:  Maturing left upper extremity arteriovenous fistula.  It is  brachiocephalic in the upper arm.   PLAN:  The patient will follow up in 6 weeks to recheck the fistula and  see if this continues to be easily palpable and if it will be ready to  use at that time.   Della Goo, PA-C   [place]. Edilia Bo, M.D.  Electronically Signed   RR/MEDQ  D:  02/26/2010  T:  02/26/2010  Job:  [id number]

## 2011-05-11 NOTE — Assessment & Plan Note (Signed)
OFFICE VISIT   Kim Hodge, Kim Hodge  DOB:  08-Feb-1956                                       04/09/2010  ZOXWR#:[id number]   Date of surgery was 01/02/2010 left upper arm AV fistula.   HISTORY OF PRESENT ILLNESS:  The patient is a 55 year old woman who has  been on hemodialysis since January through a right IJ hemodialysis  catheter on Monday, Wednesday and Friday.  She had a left upper arm AV  fistula placed by Dr. Edilia Bo on 01/02/2010.  She is 13 weeks out and  has come for a check for maturation of the fistula.  She states she  still gets some numbness and tingling in her fingers with dialysis but  has no other signs of steal and has no numbness or tingling after or in-  between dialysis episodes.   The patient also complained about bilateral jaw pain when she opens and  closes her mouth with some clicking.  Also some right shoulder and upper  arm pain with overhead movements and behind the back movements and after  lying on the shoulder at night.  She denies any issues of stroke, TIA,  slurred speech, weakness on one side or the other or amaurosis fugax.   PAST MEDICAL HISTORY:  1. Hypertension.  2. Hypercholesterolemia.  3. She denies diabetes, COPD or congestive heart failure.   SOCIAL HISTORY:  She is single with one child.   REVIEW OF SYSTEMS:  She has had some weight loss and weight gain with  loss of appetite since she has been on dialysis, some shortness of  breath with exertion, kidney disease.   PHYSICAL EXAM:  General:  This is a well-developed, well-nourished woman  in no acute distress.  Vital signs:  Her vital signs, heart rate was 80,  her sats are 100% and respiratory rate was 10.  Left upper extremity:  She had good thrill and bruit in the arm.  However, the vein was not  prominent and not easily felt.  She had a good thrill and bruit in the  fistula with a 2+ radial pulse.   Vascular lab, ultrasound of the fistula showed that it was  patent and  0.89 cm just above the anastomosis.  However, approximately 1-2 cm above  the area of the anastomosis the vein was narrowed to 0.44 cm.  Above  that it was between 0.7 and 0.8 cm.  There were no significant branches  noted and it stated that the vein was of small caliber in the area of  stenosis.  Examination of the shoulder showed tenderness at the [facility] and  area of the rotator cuff with positive impingement signs.  She also had  tenderness over the TMJ joints in her jaw.   ASSESSMENT:  1. Thirteen weeks status post left upper arm AV fistula with narrowing      approximately 1-2 cm above the anastomosis.  2. Temporomandibular joint syndrome.  3. Impingement syndrome right shoulder.   PLAN:  Is to do a fistulogram and possible ballooning of the narrowing  in the left upper arm AV fistula.  We will see her back approximately 2  weeks post her fistulogram.  The patient was advised to see a dentist  for temporomandibular joint syndrome and also an orthopedic surgeon for  her impingement syndrome of the right shoulder.  Della Goo, PA-C   [place]. Edilia Bo, M.D.  Electronically Signed   RR/MEDQ  D:  04/09/2010  T:  04/09/2010  Job:  [id number]

## 2011-05-11 NOTE — Procedures (Signed)
VASCULAR LAB EXAM   INDICATION:  Left AV fistula not maturing.   HISTORY:  The patient is on dialysis.  Diabetes:  No.  Cardiac:  No.  Hypertension:  Yes.   EXAM:  Eval left AVF.   IMPRESSION:  1. Patent left arteriovenous fistula with elevated velocities noted in      the cephalic vein near the proximal anastomosis.  2. The vein is of small caliber in the area of stenosis.  3. No significant branches noted.   ___________________________________________  Di Kindle. Edilia Bo, M.D.   NT/MEDQ  D:  04/09/2010  T:  04/09/2010  Job:  [id number]

## 2011-05-14 NOTE — Op Note (Signed)
   NAME:  Kim Hodge, Kim Hodge                          ACCOUNT NO.:  [id number]   MEDICAL RECORD NO.:  [id number]                   PATIENT TYPE:  AMB   LOCATION:  ENDO                                 FACILITY:  [facility]   PHYSICIAN:  Georgiana Spinner, M.D.                 DATE OF BIRTH:  Jun 10, 1956   DATE OF PROCEDURE:  08/01/2003  DATE OF DISCHARGE:                                 OPERATIVE REPORT   PROCEDURE:  Colonoscopy.   INDICATIONS:  Hemoccult positivity, presumably.   ANESTHESIA:  1. Demerol 50 mg.  2. Versed 5 mg.   DESCRIPTION OF PROCEDURE:  With patient mildly sedated in the left lateral  decubitus position, the Olympus videoscopic colonoscope was inserted in the  rectum and passed under direct vision to the cecum, identified by the  ileocecal valve and appendiceal orifice, both of which were photographed.  From this point, the colonoscope was slowly withdrawn, taking  circumferential views of the entire colonic mucosa, stopping only in the  rectum which appeared normal on direct and retroflexed view.  The endoscope  was straightened and withdrawn.  The patient's vital signs and pulse  oximeter remained stable.  The patient tolerated the procedure well without  apparent complications.   FINDINGS:  Unremarkable colonoscopic examination.   PLAN:  As I have discussed with the patient previously, if these are  negative exams which they have been, then a gynecologic evaluation because  of menorrhagia may be in order.                                               Georgiana Spinner, M.D.    GMO/MEDQ  D:  08/01/2003  T:  08/01/2003  Job:  [id number]

## 2011-05-14 NOTE — Op Note (Signed)
[name], Kim Hodge                ACCOUNT NO.:  [id number]   MEDICAL RECORD NO.:  [id number]          PATIENT TYPE:  AMB   LOCATION:  DSC                          FACILITY:  [facility]   PHYSICIAN:  Mark C. Ophelia Charter, M.D.    DATE OF BIRTH:  [birth year]   DATE OF PROCEDURE:  11/22/2005  DATE OF DISCHARGE:                                 OPERATIVE REPORT   PREOPERATIVE DIAGNOSIS:  Right knee medial meniscal tear, grade 4  chondromalacia, and multiple large loose bodies.   POSTOPERATIVE DIAGNOSIS:  Right knee medial meniscal tear, grade 4  chondromalacia, and multiple large loose bodies.   PROCEDURE:  Diagnostic and operative arthroscopy of right knee, examination  under anesthesia, partial medial meniscectomy, and removal of multiple large  loose bodies.   SURGEON:  Mark C. Ophelia Charter, M.D.   ANESTHESIA:  MAC.  Marcaine 30 cc infiltrated at end of case.   TOURNIQUET TIME:  None.   INDICATIONS FOR PROCEDURE:  The patient is a 55 year old female with  catching, locking, and MRI demonstrating medial meniscal tear, grade 4  chondromalacia, and multiple large loose bodies in her knee.  She has had  catching and locking symptoms for several weeks, with 4+ knee effusion.   DESCRIPTION OF PROCEDURE:  After induction of anesthesia, a proximal thigh  tourniquet, leg holder, standard prepping and draping with DuraPrep, use of  appropriate stockinette, Coban, arthroscopic sheets, and drapes were  applied.  Inflow was placed through a superolateral portal after the skin  was infiltrated with local 0.25% Marcaine with epinephrine.   Medial and lateral parapatellar tendon portals were used.  Inspection showed  chronic synovitis changes.  There were no loose bodies in the suprapatellar  pouch, but there was grade 3 and grade 4 chondromalacia of the  patellofemoral joint.  Inspection of the medial condyle further down in the  groove on the medial aspect showed grade 4 chondromalacia with polished bone  with a large area with complete loss of all articular cartilage.  In the  notch, there was a large loose body which was 5 to 6 mm in size which was  grasped and removed.  It had articular cartilage on a portion of it.  The  lateral gutters were free of loose bodies.  The medial meniscus was torn in  the posterior aspect which was trimmed back with a shaver and baskets.  On  the posteromedial aspect of the tibial plateau, there was grade 4  chondromalacia with involvement of the condyle as well.  Another loose body  was found in the notch adjacent to the ACL which was grasped.  It was 4 to 5  mm.  Another later was found which was 7 mm.  The lateral compartment showed  grade 3 changes with intact meniscus.  After thorough irrigation and  debridement, inspection  throughout the entire joint showed no remaining loose bodies found.  The  knee was aspirated.  Tincture of benzoin, Steri-Strips, Tegaderm, 4 x 4s,  Webril, and Ace wrap was applied after Marcaine infiltration.  The patient  tolerated the procedure well.  Outpatient  surgery was appropriate for  treatment of this condition, with followup in one week.      Mark C. Ophelia Charter, M.D.  Electronically Signed     MCY/MEDQ  D:  11/22/2005  T:  11/22/2005  Job:  [id number]

## 2011-05-14 NOTE — Op Note (Signed)
   NAME:  Kim Hodge, Kim Hodge                          ACCOUNT NO.:  [id number]   MEDICAL RECORD NO.:  [id number]                   PATIENT TYPE:  AMB   LOCATION:  ENDO                                 FACILITY:  [facility]   PHYSICIAN:  Georgiana Spinner, M.D.                 DATE OF BIRTH:  1956/04/28   DATE OF PROCEDURE:  08/01/2003  DATE OF DISCHARGE:                                 OPERATIVE REPORT   PROCEDURE:  Upper endoscopy.   INDICATIONS:  Hemoccult positivity, presumably.   ANESTHESIA:  1. Demerol 50 mg.  2. Versed 5 mg.   DESCRIPTION OF PROCEDURE:  With patient mildly sedated in the left lateral  decubitus position, the Olympus videoscopic endoscope was inserted in the  mouth, passed under direct vision through the esophagus, which appeared  normal, into the stomach.  Fundus, body, antrum, duodenal bulb, and second  portion of duodenum all appeared normal.  From this point, the endoscope was  slowly withdrawn, taking circumferential views of the duodenal mucosa until  the endoscope then pulled back into the stomach, placed in retroflexion to  view the stomach from below.  The endoscope was straightened and withdrawn,  taking circumferential views of the remaining gastric and esophageal mucosa.  The patient's vital signs and pulse oximeter remained stable.  The patient  tolerated the procedure well without apparent complications.   FINDINGS:  Incomplete wrap of GE junction around the endoscope of a mild  degree.  Otherwise, an unremarkable examination.   PLAN:  Proceed to colonoscopy.                                               Georgiana Spinner, M.D.    GMO/MEDQ  D:  08/01/2003  T:  08/01/2003  Job:  [id number]

## 2011-05-27 ENCOUNTER — Ambulatory Visit (HOSPITAL_COMMUNITY)
Admission: RE | Admit: 2011-05-27 | Discharge: 2011-05-27 | Disposition: A | Discharge status: Home / Self Care | Payer: 59 | Source: Ambulatory Visit | Attending: Vascular Surgery | Admitting: Vascular Surgery

## 2011-05-27 DIAGNOSIS — I120 Hypertensive chronic kidney disease with stage 5 chronic kidney disease or end stage renal disease: Secondary | ICD-10-CM

## 2011-05-27 DIAGNOSIS — Y832 Surgical operation with anastomosis, bypass or graft as the cause of abnormal reaction of the patient, or of later complication, without mention of misadventure at the time of the procedure: Secondary | ICD-10-CM | POA: Insufficient documentation

## 2011-05-27 DIAGNOSIS — T82898A Other specified complication of vascular prosthetic devices, implants and grafts, initial encounter: Secondary | ICD-10-CM | POA: Insufficient documentation

## 2011-05-27 DIAGNOSIS — N186 End stage renal disease: Secondary | ICD-10-CM

## 2011-05-27 LAB — POCT I-STAT, CHEM 8
BUN: 38 mg/dL — ABNORMAL HIGH (ref 6–23)
Chloride: 103 mEq/L (ref 96–112)
HCT: 43 % (ref 36.0–46.0)
Potassium: 3.8 mEq/L (ref 3.5–5.1)
Sodium: 135 mEq/L (ref 135–145)

## 2011-05-30 NOTE — Op Note (Signed)
NAMECADDIE, RANDLE                ACCOUNT NO.:  [id number]  MEDICAL RECORD NO.:  [id number]           PATIENT TYPE:  O  LOCATION:  SDSC                         FACILITY:  [facility]  PHYSICIAN:  Fransisco Hertz, MD       DATE OF BIRTH:  Dec 28, 1955  DATE OF PROCEDURE:  05/27/2011 DATE OF DISCHARGE:  05/27/2011                              OPERATIVE REPORT   PROCEDURES: 1. Left arm fistulogram. 2. Venoplasty of the cephalic vein x5, 2 inflations at 4 mm x 40 mm     and 3 inflations at 6 mm x 40 mm.  PREOPERATIVE DIAGNOSIS:  Venous outflow obstruction.  POSTOPERATIVE DIAGNOSIS:  Venous outflow obstruction.  SURGEON:  Fransisco Hertz, MD.  ANESTHESIA:  Conscious sedation.  ESTIMATED BLOOD LOSS:  Minimal.  CONTRAST:  40 mL.  SPECIMENS:  None.  FINDINGS: 1. Three sets of stenoses in the cephalic vein at the previous     location, the worse one is at the mid segment with a less than 2-mm     diameter, putting it over a greater 75% stenosis here. 2. Second stenosis is at the cephalic vein confluence. 3. There is another stenosis just proximal to the most proximal     pseudoaneurysm. 4. There is resolution of the confluence stenosis with 2 sets of     angioplasties. 5. Mid segment stenosis reduced from 75% to less than 30% after 3 sets     of angioplasty.  The subclavian and axillary vein appeared to be     patent as are the innominate on the left side.  Also, there are 2     sets of pseudoaneurysms, are present in this fistula, and no     evidence of extravasation from either these.  INDICATIONS:  This is a 55 year old female with a left brachiocephalic arteriovenous fistula which has been having poor flow rates.  She, in April 2012, had another percutaneous procedure completed, in which they angioplastied her cephalic vein outflow to try to improve it.  The function now less in 2 months later, she returns with poor functioning in this fistula again.  She is aware of the risk and  benefits of this procedure.  We discussed the risks including possible rupture of her fistula, possible thrombosis of the fistula, and impossible need for additional procedures.  She was aware of these risks and agreed to proceed.  DESCRIPTION OF THE PROCEDURE:  After full informed written consent was obtained from the patient, she was brought back to the angio suite and placed supine upon angio table.  She was connected to the monitoring equipment and given conscious sedation, amounts of which are documented in our chart.  We then prepped and draped her in a standard fashion for a left arm fistulogram.  I cannulated the fistula near its arterial anastomosis under ultrasonic guidance and placed a micro puncture sheath over the wire into this fistula.  It was secured it in place with a Tegaderm.  Hand injections were completed to complete the fistulogram and central venogram.  At this point, I felt that intervention Would be necessary, so  I placed a Bentson wire through this fistula, easily traversing to the innominate vein.  I then exchanged the sheath out for a short 6-French sheath.  I then obtained a 4 mm x 40 mm Powerflex balloon which was used to angioplasty the cephalic vein confluence and also the most tight area in the mid segment.  This was completed with minimal waist, 30 seconds at 10 atmospheres at both locations.  I then did an interval hand injection through the sheath.  This demonstrated residual stenosis greater than 30%.  I then, at this point, exchanged the balloon out for 6 mm x 40 mm Durata balloon.  This was used to treat the central stenosis and the mid segment stenosis at 8 atmosphere inflations at 30 seconds.  The hand injection was then completed after removing the balloon.  The central stenosis resolved completely.  The mid segment, however, remained greater than 30%, so I measured the more distal segment at the vein, it was noted only be 6 mm, so at this point  I angioplastied it once again with the Durata balloon up to 14 atmospheres at 1 minute hold time.  This balloon was then removed and then hand injections demonstrated a less than 30% stenosis at this point.  Note, there is a third of stenosis near the proximal pseudoaneurysm.  I did not angioplasty due to its close proximity to the pseudoaneurysm which looked to be quite degenerative and to avoid the possible rupture of this pseudoaneurysm.  I elected not to angioplasty this.  At this point, I examined the arms.  There was a great improvement in the thrill and subsequently, I did not elect to try to angioplasty this stenosis adjacent to the pseudoaneurysm.  At this point, then I removed all the wires and balloons and then I placed a pursestring stitch around the sheath and then while pulling out the sheath, tied down the pursestring suture.  I had to hold a little bit of pressure here, there was a little bit of bleeding from the fistula at this point and a small hematoma was present at this point, however, there continued be a strong pulse on this arterial side of this fistula.  After holding pressure for a few minutes, there was no further bleeding.  Bandages were then applied.  At this point, I checked the fistula for a bruit with a stethoscope, this was greatly improved from previous and there was complete resolution of the previous pulsatile nature in this fistula.  COMPLICATIONS:  None.  CONDITION:  Stable.  My plan in this patient is refer her back to Dr. Myra Gianotti for evaluation for additional access placement.  Another possibility would be placement of a Viabahn stent, covering the most proximal pseudoaneurysm and also the mid segment stenosis.  Unfortunately, I think with some additional interventions going to be necessary as fundamentally this patient's outflow is now compromised, now requiring 2 sets of angioplasties to maintain and so consideration of additional fistula  placement should occur at this point.     Fransisco Hertz, MD     BLC/MEDQ  D:  05/27/2011  T:  05/28/2011  Job:  [id number]  Electronically Signed by Leonides Sake MD on 05/30/2011 01:41:31 PM

## 2011-06-03 ENCOUNTER — Other Ambulatory Visit: Payer: Self-pay | Admitting: Nephrology

## 2011-06-03 DIAGNOSIS — M5410 Radiculopathy, site unspecified: Secondary | ICD-10-CM

## 2011-06-10 ENCOUNTER — Other Ambulatory Visit: Payer: 59

## 2011-07-17 IMAGING — XA IR PTA VENOUS
1 series · 12 of 24 positions shown · non-contrast
Comparison: none

CLINICAL DATA: End stage renal disease, occluded AV graft

[Series 1: run · 12 of 63 slices shown]
[im 3/63]
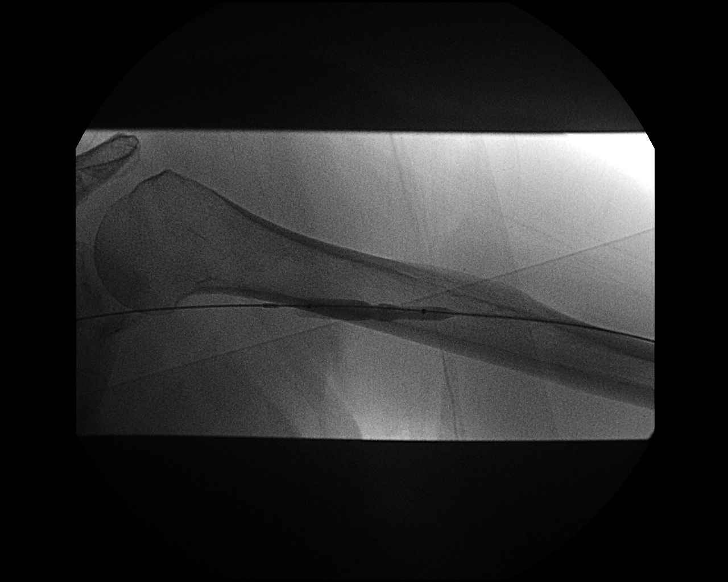
[im 9/63]
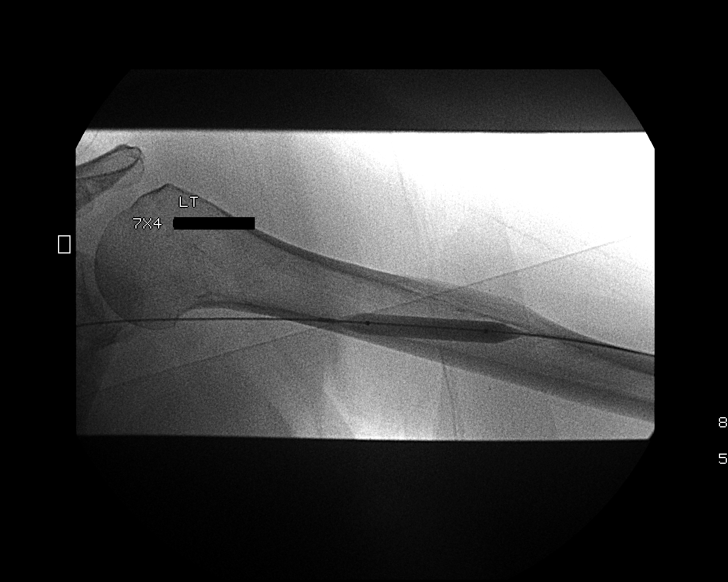
[im 14/63]
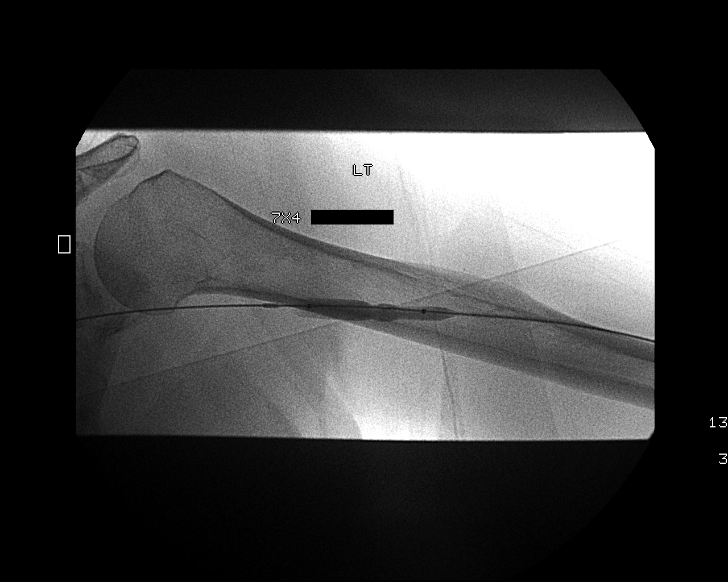
[im 19/63]
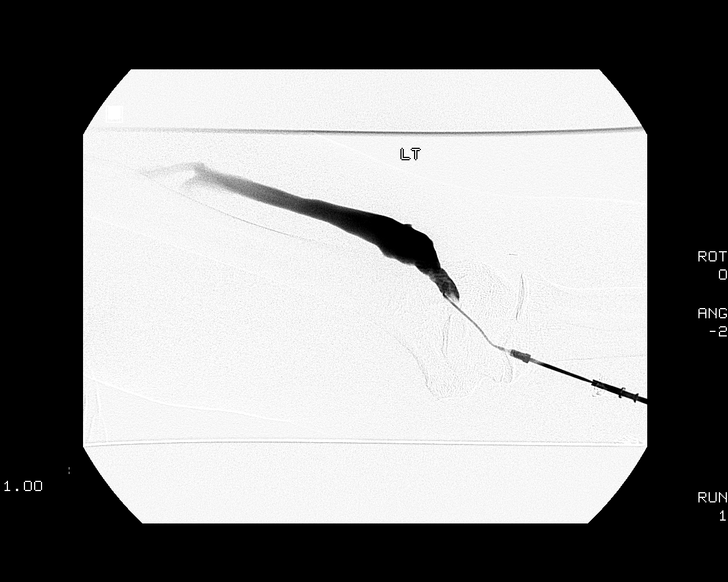
[im 25/63]
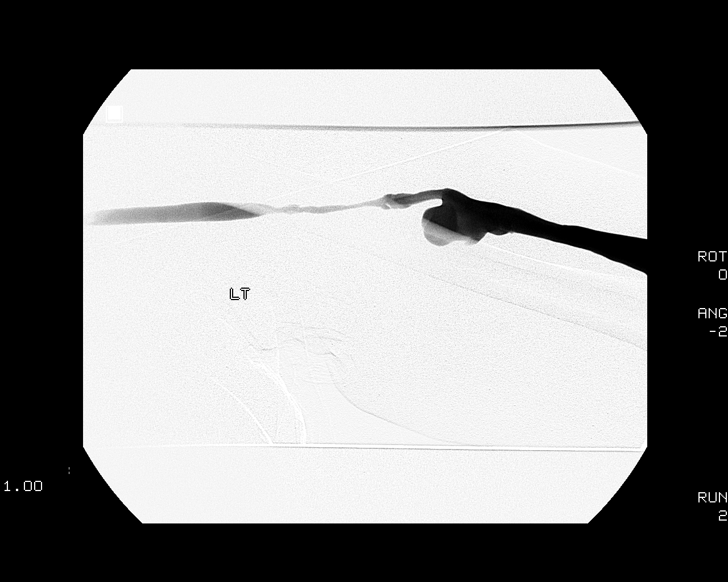
[im 30/63]
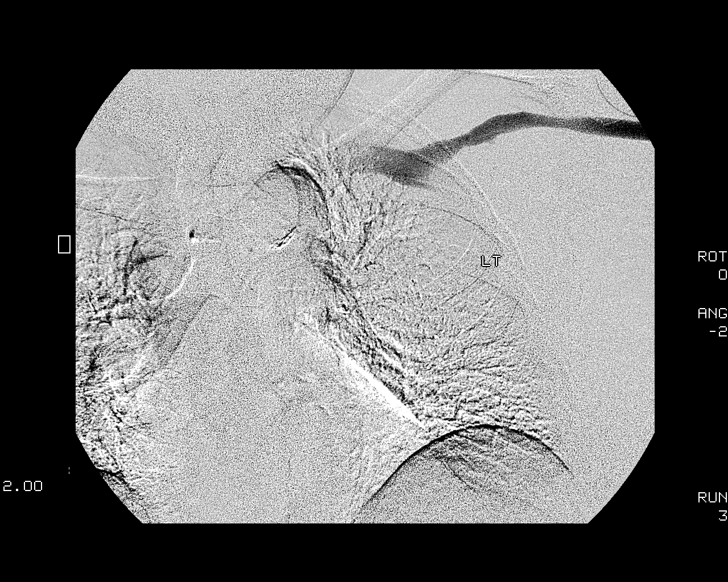
[im 36/63]
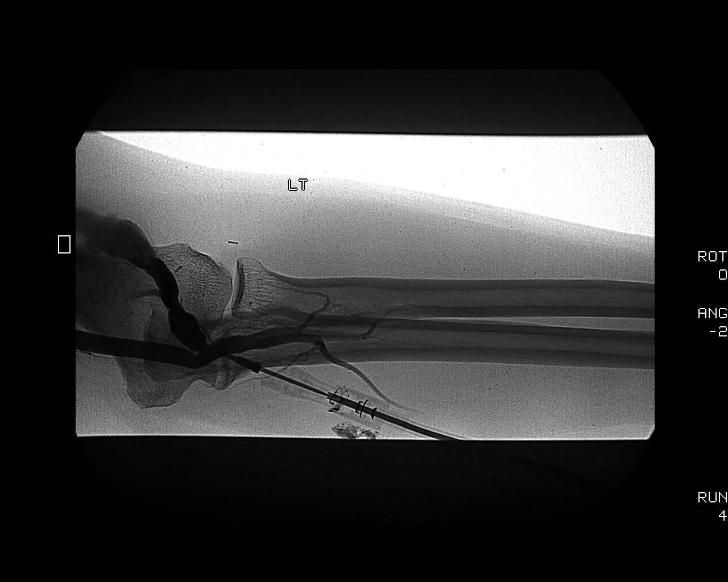
[im 41/63]
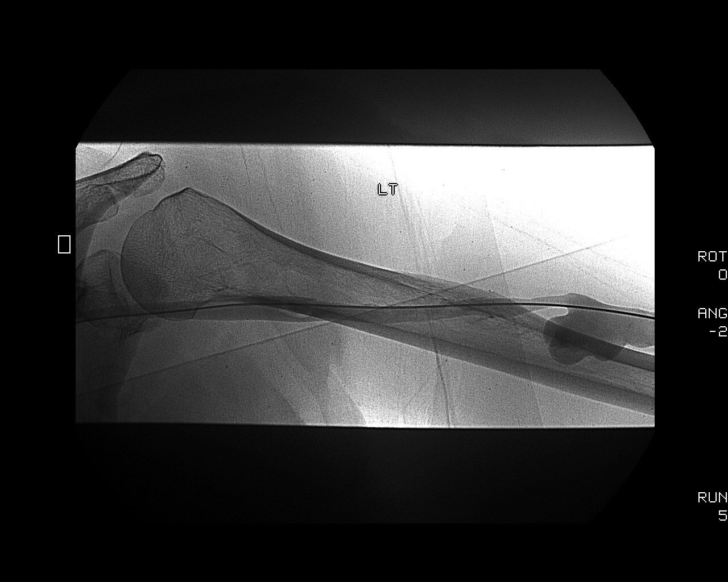
[im 46/63]
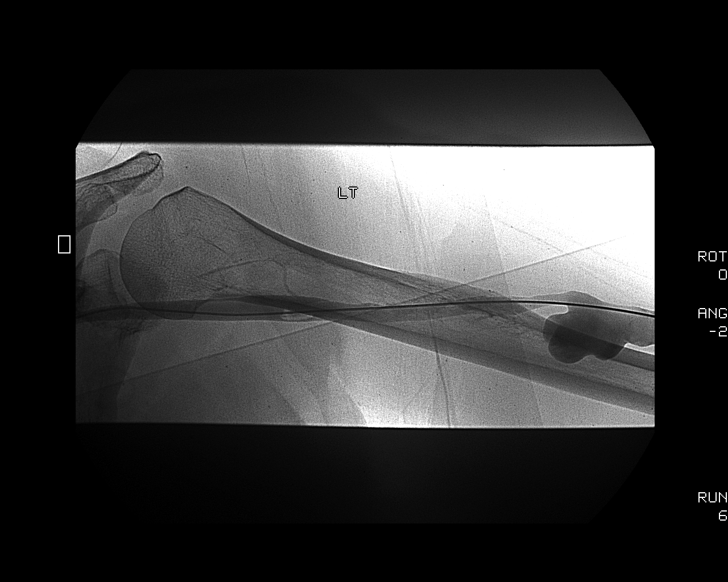
[im 52/63]
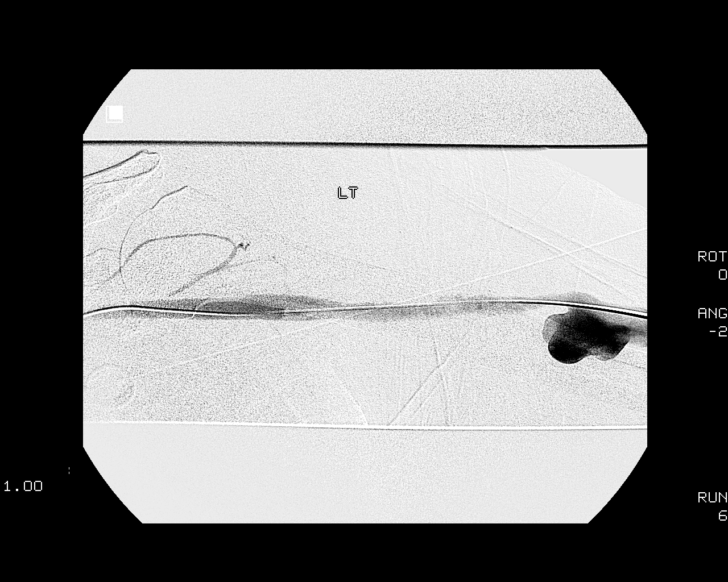
[im 57/63]
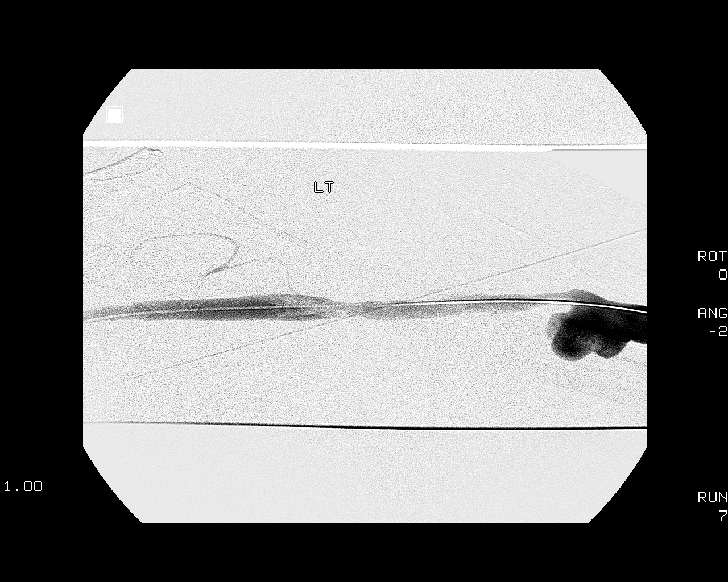
[im 63/63]
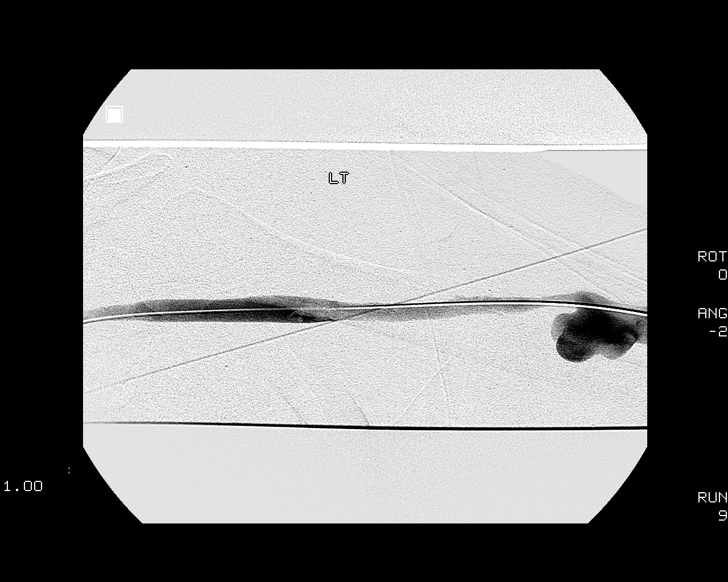

[12 of 24 positions shown; findings below may reference images not displayed]

LEFT UPPER EXTREMITY AV FISTULOGRAM
7 MM VENOUS ANGIOPLASTY

Date:  01/22/2011 [DATE]

Radiologist:  Jhon E Quecan, M.D.

Medications:  1% lidocaine locally

Guidance:  Fluoroscopic

Fluoroscopy time:  3.8 minutes

Sedation time:  None.

Contrast volume:  60 ml [id number]

Complications:  No immediate

PROCEDURE/FINDINGS:

Informed consent was obtained from the patient following
explanation of the procedure, risks, benefits and alternatives.
The patient understands, agrees and consents for the procedure.
All questions were addressed.  A time out was performed.

Maximal barrier sterile technique utilized including caps, mask,
sterile gowns, sterile gloves, large sterile drape, hand hygiene,
and betadine

Under sterile conditions, the left upper arm fistula was accessed
with an 18 gauge angiocath.  Contrast injection performed for a
complete fistulogram from the anastomosis to the right atrium.

Left upper extremity fistulogram:  Arterial anastomosis to the
brachial artery is patent.  Outflow fistula vein is the cephalic
vein.  There are peripheral fistula pseudoaneurysms noted in the
upper arm.  In the axillary region, there is a long segment severe
stenosis of the outflow cephalic vein estimated at 60 - 80%.  More
centrally the cephalic subclavian junction is patent.  Subclavian
and innominate veins are patent.  SVC is patent.  No central
stenosis or occlusion.

7 mm venous angioplasty:  Under sterile conditions and local
anesthesia, 6-French sheath was inserted.  Guide wire access was
obtained across the peripheral outflow venous stenosis.
Localization venogram performed.  Overlapping prolonged angioplasty
performed with a 7 mm x 4 cm Conquest balloon throughout the
stenotic segment.  Following this, a final fistulogram demonstrates
minimal irregularity of the stenotic segment but no significant
residual stenosis.  Brisk fistula flow preserved.  No immediate
complication.  Sheath and guide wire removed.  Hemostasis obtained
with compression.
IMPRESSION: Successful 7 mm venous angioplasty of a peripheral cephalic venous
outflow recurrent stenosis.

Access management:  This fistula remains amenable to percutaneous
venous angioplasty to treat decreased access flow rates.  If the
fistula occludes, recommend surgical placement of a new access site
because of the degree of chronic fistula pseudoaneurysm formation.

## 2011-08-23 ENCOUNTER — Ambulatory Visit (HOSPITAL_COMMUNITY): Payer: 59

## 2011-08-23 ENCOUNTER — Ambulatory Visit (HOSPITAL_COMMUNITY)
Admission: AD | Admit: 2011-08-23 | Discharge: 2011-08-23 | Disposition: A | Discharge status: Home / Self Care | Payer: 59 | Source: Ambulatory Visit | Attending: Vascular Surgery | Admitting: Vascular Surgery

## 2011-08-23 DIAGNOSIS — N186 End stage renal disease: Secondary | ICD-10-CM | POA: Insufficient documentation

## 2011-08-23 DIAGNOSIS — I120 Hypertensive chronic kidney disease with stage 5 chronic kidney disease or end stage renal disease: Secondary | ICD-10-CM

## 2011-08-23 DIAGNOSIS — F17200 Nicotine dependence, unspecified, uncomplicated: Secondary | ICD-10-CM | POA: Insufficient documentation

## 2011-08-23 DIAGNOSIS — K219 Gastro-esophageal reflux disease without esophagitis: Secondary | ICD-10-CM | POA: Insufficient documentation

## 2011-08-23 DIAGNOSIS — Z992 Dependence on renal dialysis: Secondary | ICD-10-CM | POA: Insufficient documentation

## 2011-08-23 LAB — SURGICAL PCR SCREEN
MRSA, PCR: NEGATIVE
Staphylococcus aureus: NEGATIVE

## 2011-08-23 LAB — POCT I-STAT 4, (NA,K, GLUC, HGB,HCT)
Glucose, Bld: 91 mg/dL (ref 70–99)
Potassium: 3.8 mEq/L (ref 3.5–5.1)

## 2011-08-25 ENCOUNTER — Encounter: Payer: Self-pay | Admitting: Vascular Surgery

## 2011-08-31 ENCOUNTER — Encounter: Payer: Self-pay | Admitting: Vascular Surgery

## 2011-09-01 ENCOUNTER — Ambulatory Visit (INDEPENDENT_AMBULATORY_CARE_PROVIDER_SITE_OTHER): Payer: 59

## 2011-09-01 ENCOUNTER — Ambulatory Visit (INDEPENDENT_AMBULATORY_CARE_PROVIDER_SITE_OTHER): Payer: 59 | Admitting: Vascular Surgery

## 2011-09-01 ENCOUNTER — Encounter: Payer: Self-pay | Admitting: Vascular Surgery

## 2011-09-01 VITALS — BP 127/92 | HR 82 | Resp 16 | Ht 63.0 in | Wt 172.0 lb

## 2011-09-01 DIAGNOSIS — T82598A Other mechanical complication of other cardiac and vascular devices and implants, initial encounter: Secondary | ICD-10-CM

## 2011-09-01 DIAGNOSIS — Z01810 Encounter for preprocedural cardiovascular examination: Secondary | ICD-10-CM

## 2011-09-01 DIAGNOSIS — N186 End stage renal disease: Secondary | ICD-10-CM

## 2011-09-01 NOTE — Progress Notes (Signed)
The patient presents today for discussion of next AV access for hemodialysis. She had a left upper arm AV fistula creation in January of 2011. She had a recent thrombosis of this after attempts at salvaging the fistula. She did have a  extensive superficial thrombophlebitis in thrombosed fistula. She had a right IJ dietetic catheter placement she is currently using for hemodialysis the difficulty.  Past Medical History  Diagnosis Date  . Hypertension   . Hyperlipidemia   . Obesity   . Chronic kidney disease   . Fibrosis of uterus   . Membranoproliferative glomerulonephritis     type 1  . Diabetes mellitus   . Anemia   . Chronic bronchitis   . Degenerative joint disease     History  Substance Use Topics  . Smoking status: Current Some Day Smoker -- 0.2 packs/day for 40 years    Types: Cigarettes  . Smokeless tobacco: Not on file  . Alcohol Use: No    Family History  Problem Relation Age of Onset  . Cancer Mother     breast  . Stroke Father     Allergies  Allergen Reactions  . Ace Inhibitors     REACTION: angioedema    Current outpatient prescriptions:aspirin 81 MG EC tablet, Take 81 mg by mouth daily.  , Disp: , Rfl: ;  cinacalcet (SENSIPAR) 30 MG tablet, Take 30 mg by mouth daily.  , Disp: , Rfl: ;  folic acid-vitamin b complex-vitamin c-selenium-zinc (DIALYVITE) 3 MG TABS, Take 1 tablet by mouth daily.  , Disp: , Rfl: ;  gabapentin (NEURONTIN) 100 MG capsule, Take 100 mg by mouth 3 (three) times daily. TAKE 2 TABLETS THREE TIMES DAILY. , Disp: , Rfl:  lanthanum (FOSRENOL) 1000 MG chewable tablet, Chew 1,000 mg by mouth 2 (two) times daily with a meal. TAKES 2 TABLETS WITH EACH MEAL. , Disp: , Rfl: ;  pravastatin (PRAVACHOL) 20 MG tablet, Take 20 mg by mouth daily.  , Disp: , Rfl: ;  promethazine (PHENERGAN) 25 MG tablet, Take 25 mg by mouth every 4 (four) hours as needed.  , Disp: , Rfl: ;  amLODipine (NORVASC) 10 MG tablet, Take 10 mg by mouth daily.  , Disp: , Rfl:    atenolol (TENORMIN) 50 MG tablet, Take 50 mg by mouth daily.  , Disp: , Rfl: ;  Calcium Acetate 667 MG TABS, Take 3 capsules by mouth 3 (three) times daily.  , Disp: , Rfl: ;  dexlansoprazole (DEXILANT) 60 MG capsule, Take 60 mg by mouth daily as needed.  , Disp: , Rfl: ;  docusate sodium (COLACE) 100 MG capsule, Take 100 mg by mouth as needed.  , Disp: , Rfl:  ferrous sulfate 325 (65 FE) MG tablet, Take 325 mg by mouth 2 (two) times daily with a meal.  , Disp: , Rfl: ;  HYDROcodone-acetaminophen (NORCO) 5-325 MG per tablet, Take 2 tablets by mouth every 4 (four) hours as needed.  , Disp: , Rfl: ;  metolazone (ZAROXOLYN) 5 MG tablet, Take 5 mg by mouth daily.  , Disp: , Rfl: ;  ranolazine (RANEXA) 500 MG 12 hr tablet, Take 500 mg by mouth daily.  , Disp: , Rfl:   BP 127/92  Pulse 82  Resp 16  Ht 5\' 3"  (1.6 m)  Wt 172 lb (78.019 kg)  BMI 30.47 kg/m2  Body mass index is 30.47 kg/(m^2).        Physical exam well-developed well-nourished white female no acute distress radial pulses  2+ bilaterally she does have a thrombosed left upper arm AV fistula with a clot present in the vein. She is very small surface veins.  Ultrasound vein mapping right arm: Very small unusable cephalic vein throughout its course.  Impression and plan: I discussed options with Ms. Wicker I recommend that she have a left upper arm AV Gore-Tex graft placement. She understands procedure and need for ongoing maintenance of the graft. She dialyzes on Monday was a Friday we have scheduled him for next Thursday, September 13 at [facility].

## 2011-09-09 ENCOUNTER — Ambulatory Visit (HOSPITAL_COMMUNITY)
Admission: RE | Admit: 2011-09-09 | Discharge: 2011-09-09 | DRG: 673 | Disposition: A | Discharge status: Home / Self Care | Payer: 59 | Source: Ambulatory Visit | Attending: Vascular Surgery | Admitting: Vascular Surgery

## 2011-09-09 DIAGNOSIS — N185 Chronic kidney disease, stage 5: Secondary | ICD-10-CM | POA: Insufficient documentation

## 2011-09-09 DIAGNOSIS — J4489 Other specified chronic obstructive pulmonary disease: Secondary | ICD-10-CM | POA: Insufficient documentation

## 2011-09-09 DIAGNOSIS — F17200 Nicotine dependence, unspecified, uncomplicated: Secondary | ICD-10-CM | POA: Insufficient documentation

## 2011-09-09 DIAGNOSIS — Z01810 Encounter for preprocedural cardiovascular examination: Secondary | ICD-10-CM | POA: Insufficient documentation

## 2011-09-09 DIAGNOSIS — J449 Chronic obstructive pulmonary disease, unspecified: Secondary | ICD-10-CM | POA: Insufficient documentation

## 2011-09-09 DIAGNOSIS — T82898A Other specified complication of vascular prosthetic devices, implants and grafts, initial encounter: Secondary | ICD-10-CM

## 2011-09-09 DIAGNOSIS — N186 End stage renal disease: Secondary | ICD-10-CM

## 2011-09-09 DIAGNOSIS — Z01812 Encounter for preprocedural laboratory examination: Secondary | ICD-10-CM | POA: Insufficient documentation

## 2011-09-09 DIAGNOSIS — I120 Hypertensive chronic kidney disease with stage 5 chronic kidney disease or end stage renal disease: Secondary | ICD-10-CM | POA: Insufficient documentation

## 2011-09-09 DIAGNOSIS — E669 Obesity, unspecified: Secondary | ICD-10-CM | POA: Insufficient documentation

## 2011-09-09 LAB — POCT I-STAT 4, (NA,K, GLUC, HGB,HCT)
Glucose, Bld: 91 mg/dL (ref 70–99)
Potassium: 4 mEq/L (ref 3.5–5.1)
Sodium: 137 mEq/L (ref 135–145)

## 2011-09-09 NOTE — Procedures (Unsigned)
CEPHALIC VEIN MAPPING  INDICATION:  Vein mapping for possible right AVF placement.  HISTORY: Hypertension, hyperlipidemia, current right IJ catheterization.  EXAM: The right cephalic vein is compressible.  Diameter measurements range from 0.35 to 0.11 cm.  The right basilic vein is compressible.  Diameter measurements range from 0.31 to 0.23 cm.  See attached worksheet for all measurements.  IMPRESSION:  Patent right cephalic and basilic veins with diameter measurements included on the following worksheet.  ___________________________________________ Larina Earthly, M.D.  EM/MEDQ  D:  09/01/2011  T:  09/01/2011  Job:  [id number]

## 2011-09-10 NOTE — Op Note (Signed)
  Kim Hodge, Kim Hodge                ACCOUNT NO.:  [id number]  MEDICAL RECORD NO.:  [id number]  LOCATION:  2899                         FACILITY:  [facility]  PHYSICIAN:  Di Kindle. Edilia Bo, M.D.DATE OF BIRTH:  1956/01/01  DATE OF PROCEDURE:  09/09/2011 DATE OF DISCHARGE:  09/09/2011                              OPERATIVE REPORT   PREOPERATIVE DIAGNOSIS:  Chronic kidney disease, stage V.  POSTOPERATIVE DIAGNOSIS:  Chronic kidney disease, stage V.  PROCEDURE:  Placement of a new left upper arm arteriovenous graft.  SURGEON:  Di Kindle. Edilia Bo, MD  ASSISTANT:  Pecola Leisure, PA  ANESTHESIA:  General.  TECHNIQUE:  The patient was taken to the operating room and received general anesthetic.  The left upper extremity was prepped and draped in the usual sterile fashion.  She had been evaluated in the office and it was felt that her best option was a left upper arm AV graft.  She had an upper arm fistula which was thrombosed.  I elected to tunnel the upper arm graft around her fistula.  A longitudinal incision was made over the brachial artery and the distal third of the upper arm and here the brachial artery was dissected free.  It was somewhat small, but had a reasonable pulse.  A separate incision was made in the axilla and here the high brachial vein was dissected free.  The graft then tunneled in a loop fashion in the upper arm extending down towards the antecubital space and then up the lateral aspect of the arm.  The patient was then heparinized.  The brachial artery was clamped proximally and distally and a longitudinal arteriotomy was made.  A segment of 4-mm end of the graft was excised.  The graft was spatulated and sewn end-to-side to the artery using continuous 6-0 Prolene suture.  The graft was pulled to the appropriate length for anastomosis to the high brachial vein.  The vein was ligated distally and spatulated proximally.  The graft was cut to the  appropriate length, spatulated, and sewn end to end of the vein using continuous 6-0 Prolene suture.  At the completion, there was a good thrill in the graft.  Hemostasis was obtained in the wounds and the wounds were closed with deep layer of 3-0 Vicryl and the skin was closed with 4-0 Vicryl.  There was good radial and ulnar signal with Doppler. Sterile dressing was applied.  The patient tolerated the procedure well and was transferred to the recovery room in stable condition.  All needle and sponge counts were correct.    Di Kindle. Edilia Bo, M.D.    CSD/MEDQ  D:  09/09/2011  T:  09/10/2011  Job:  [id number]  Electronically Signed by Waverly Ferrari M.D. on 09/10/2011 02:55:32 PM

## 2011-09-14 NOTE — Op Note (Signed)
  NAMEMONESHA, Hodge                ACCOUNT NO.:  [id number]  MEDICAL RECORD NO.:  [id number]  LOCATION:  [facility]                         FACILITY:  [facility]  PHYSICIAN:  Larina Earthly, M.D.    DATE OF BIRTH:  April 22, 1956  DATE OF PROCEDURE:  08/23/2011 DATE OF DISCHARGE:  08/23/2011                              OPERATIVE REPORT   PREOPERATIVE DIAGNOSIS:  End-stage renal disease with left arm arteriovenous fistula.  POSTOPERATIVE DIAGNOSIS:  End-stage renal disease with left arm arteriovenous fistula.  PROCEDURES:  Placement of right internal jugular Diatek catheter with ultrasound visualization.  SURGEON:  Larina Earthly, MD  ASSISTANT:  Nurse.  ANESTHESIA:  MAC.  COMPLICATIONS:  None.  DISPOSITION:  Recovery room, stable.  PROCEDURE IN DETAIL:  The patient was taken to the operating room and placed in supine position where the right and left neck were imaged with ultrasound revealing widely patent jugular veins bilaterally.  The patient was placed in Trendelenburg position using local anesthesia and a finder needle, the right internal jugular vein was accessed.  Next, using a Seldinger technique, a guidewire was passed down to the level of the right atrium and this was confirmed with fluoroscopy.  The catheter, dilator, and peel-away sheath were passed over the guidewire and the 28- cm catheter was placed through the peel-away sheath.  The peel-away sheath was then removed.  The catheter tips were placed at the level of distal right atrium.  The catheter was brought through subcutaneous tunnel through a separate stab incision and the two lumen ports were attached.  Both lumens were flushed and aspirated easily and were locked with 1000 units/mL of heparin.  The catheter was secured to the skin with 3-0 nylon stitch and entry site was closed with 4-0 subcuticular Vicryl stitch.  Sterile dressing was applied.  The patient was taken to the recovery room in stable  condition.     Larina Earthly, M.D.     TFE/MEDQ  D:  08/23/2011  T:  08/24/2011  Job:  [id number]  Electronically Signed by TODD EARLY M.D. on 09/14/2011 01:11:07 PM

## 2011-09-24 LAB — COMPREHENSIVE METABOLIC PANEL
AST: 35
Albumin: 1.4 — ABNORMAL LOW
Calcium: 8.3 — ABNORMAL LOW
Creatinine, Ser: 1.47 — ABNORMAL HIGH
GFR calc Af Amer: 45 — ABNORMAL LOW

## 2011-09-24 LAB — CBC
HCT: 29.4 — ABNORMAL LOW
HCT: 29.5 — ABNORMAL LOW
Hemoglobin: 10 — ABNORMAL LOW
MCHC: 33.5
MCV: 92.4
MCV: 92.9
MCV: 91.5
Platelets: 426 — ABNORMAL HIGH
Platelets: 405 — ABNORMAL HIGH
RBC: 3.18 — ABNORMAL LOW
WBC: 8.9
WBC: 9.5

## 2011-09-24 LAB — CROSSMATCH

## 2011-09-24 LAB — DIFFERENTIAL
Eosinophils Relative: 3
Lymphocytes Relative: 14
Lymphs Abs: 1.4
Monocytes Absolute: 0.8

## 2011-09-24 LAB — PROTIME-INR: Prothrombin Time: 13.2

## 2011-10-19 ENCOUNTER — Other Ambulatory Visit (HOSPITAL_COMMUNITY): Payer: Self-pay | Admitting: Nephrology

## 2011-10-19 DIAGNOSIS — N186 End stage renal disease: Secondary | ICD-10-CM

## 2011-10-26 ENCOUNTER — Encounter (HOSPITAL_COMMUNITY): Payer: 59 | Attending: Vascular Surgery

## 2011-10-26 DIAGNOSIS — Z452 Encounter for adjustment and management of vascular access device: Secondary | ICD-10-CM | POA: Insufficient documentation

## 2011-10-26 DIAGNOSIS — N186 End stage renal disease: Secondary | ICD-10-CM | POA: Insufficient documentation

## 2011-10-26 DIAGNOSIS — I120 Hypertensive chronic kidney disease with stage 5 chronic kidney disease or end stage renal disease: Secondary | ICD-10-CM | POA: Insufficient documentation

## 2011-10-28 ENCOUNTER — Ambulatory Visit (HOSPITAL_COMMUNITY): Payer: 59

## 2012-01-07 ENCOUNTER — Ambulatory Visit (HOSPITAL_COMMUNITY)
Admission: RE | Admit: 2012-01-07 | Discharge: 2012-01-07 | Disposition: A | Discharge status: Home / Self Care | Payer: 59 | Source: Ambulatory Visit | Attending: Nephrology | Admitting: Nephrology

## 2012-01-07 ENCOUNTER — Other Ambulatory Visit (HOSPITAL_COMMUNITY): Payer: Self-pay | Admitting: Nephrology

## 2012-01-07 ENCOUNTER — Encounter (HOSPITAL_COMMUNITY): Payer: Self-pay

## 2012-01-07 DIAGNOSIS — Z5309 Procedure and treatment not carried out because of other contraindication: Secondary | ICD-10-CM | POA: Insufficient documentation

## 2012-01-07 DIAGNOSIS — N186 End stage renal disease: Secondary | ICD-10-CM

## 2012-01-07 DIAGNOSIS — Y832 Surgical operation with anastomosis, bypass or graft as the cause of abnormal reaction of the patient, or of later complication, without mention of misadventure at the time of the procedure: Secondary | ICD-10-CM | POA: Insufficient documentation

## 2012-01-07 DIAGNOSIS — R509 Fever, unspecified: Secondary | ICD-10-CM | POA: Insufficient documentation

## 2012-01-07 DIAGNOSIS — N19 Unspecified kidney failure: Secondary | ICD-10-CM | POA: Insufficient documentation

## 2012-01-07 DIAGNOSIS — T82898A Other specified complication of vascular prosthetic devices, implants and grafts, initial encounter: Secondary | ICD-10-CM | POA: Insufficient documentation

## 2012-01-07 HISTORY — DX: Secondary hyperparathyroidism of renal origin: N25.81

## 2012-01-07 LAB — POCT I-STAT 4, (NA,K, GLUC, HGB,HCT): HCT: 40 % (ref 36.0–46.0)

## 2012-01-07 MED ORDER — LORAZEPAM 2 MG/ML IJ SOLN
INTRAMUSCULAR | Status: AC
  Filled 2012-01-07: qty 1

## 2012-01-07 MED ORDER — LORAZEPAM 2 MG/ML IJ SOLN
INTRAMUSCULAR | Status: AC | PRN
  Administered 2012-01-07: 1 mg via INTRAVENOUS

## 2012-01-07 MED ORDER — HEPARIN SODIUM (PORCINE) 1000 UNIT/ML IJ SOLN
INTRAMUSCULAR | Status: AC
  Filled 2012-01-07: qty 1

## 2012-01-07 NOTE — H&P (Signed)
  Patient with clotted HD access in need of declot procedure.  Patient however today is febrile and has had flu like symptoms x 3 days. After discussion with renal team - it was elected to place temporary dialysis catheter today so that patient could dialyze and return next few days for declot procedure if symptoms resolving.  Patient made aware of procedure and verbalized her understanding and agreement of plan.  Plan to proceed with vas cath placement then to dialysis.  Written consent obtained.

## 2012-01-07 NOTE — Procedures (Signed)
Temp RIJV HD cath 20 cm Tip RA

## 2012-01-10 ENCOUNTER — Inpatient Hospital Stay (HOSPITAL_COMMUNITY)
Admission: EM | Admit: 2012-01-10 | Discharge: 2012-01-12 | DRG: 166 | Disposition: A | Discharge status: Home / Self Care | Payer: 59 | Attending: Internal Medicine | Admitting: Internal Medicine

## 2012-01-10 ENCOUNTER — Ambulatory Visit (HOSPITAL_COMMUNITY): Admission: RE | Admit: 2012-01-10 | Payer: 59 | Source: Ambulatory Visit

## 2012-01-10 ENCOUNTER — Other Ambulatory Visit: Payer: Self-pay

## 2012-01-10 ENCOUNTER — Emergency Department (HOSPITAL_COMMUNITY): Payer: 59

## 2012-01-10 ENCOUNTER — Encounter (HOSPITAL_COMMUNITY): Payer: Self-pay | Admitting: *Deleted

## 2012-01-10 DIAGNOSIS — M1990 Unspecified osteoarthritis, unspecified site: Secondary | ICD-10-CM | POA: Diagnosis present

## 2012-01-10 DIAGNOSIS — E876 Hypokalemia: Secondary | ICD-10-CM | POA: Diagnosis present

## 2012-01-10 DIAGNOSIS — I10 Essential (primary) hypertension: Secondary | ICD-10-CM | POA: Insufficient documentation

## 2012-01-10 DIAGNOSIS — E785 Hyperlipidemia, unspecified: Secondary | ICD-10-CM | POA: Diagnosis present

## 2012-01-10 DIAGNOSIS — D649 Anemia, unspecified: Secondary | ICD-10-CM | POA: Diagnosis present

## 2012-01-10 DIAGNOSIS — F17200 Nicotine dependence, unspecified, uncomplicated: Secondary | ICD-10-CM | POA: Diagnosis present

## 2012-01-10 DIAGNOSIS — B9789 Other viral agents as the cause of diseases classified elsewhere: Secondary | ICD-10-CM | POA: Diagnosis present

## 2012-01-10 DIAGNOSIS — N2581 Secondary hyperparathyroidism of renal origin: Secondary | ICD-10-CM | POA: Diagnosis present

## 2012-01-10 DIAGNOSIS — G4700 Insomnia, unspecified: Secondary | ICD-10-CM | POA: Diagnosis present

## 2012-01-10 DIAGNOSIS — J209 Acute bronchitis, unspecified: Principal | ICD-10-CM | POA: Diagnosis present

## 2012-01-10 DIAGNOSIS — Y841 Kidney dialysis as the cause of abnormal reaction of the patient, or of later complication, without mention of misadventure at the time of the procedure: Secondary | ICD-10-CM | POA: Diagnosis present

## 2012-01-10 DIAGNOSIS — N186 End stage renal disease: Secondary | ICD-10-CM

## 2012-01-10 DIAGNOSIS — I120 Hypertensive chronic kidney disease with stage 5 chronic kidney disease or end stage renal disease: Secondary | ICD-10-CM | POA: Diagnosis present

## 2012-01-10 DIAGNOSIS — Z992 Dependence on renal dialysis: Secondary | ICD-10-CM

## 2012-01-10 DIAGNOSIS — E669 Obesity, unspecified: Secondary | ICD-10-CM | POA: Diagnosis present

## 2012-01-10 DIAGNOSIS — R109 Unspecified abdominal pain: Secondary | ICD-10-CM | POA: Insufficient documentation

## 2012-01-10 DIAGNOSIS — T82898A Other specified complication of vascular prosthetic devices, implants and grafts, initial encounter: Secondary | ICD-10-CM | POA: Diagnosis present

## 2012-01-10 LAB — INFLUENZA PANEL BY PCR (TYPE A & B): H1N1 flu by pcr: NOT DETECTED

## 2012-01-10 LAB — CULTURE, BLOOD (ROUTINE X 2)
Culture  Setup Time: 201301142206
Culture: NO GROWTH

## 2012-01-10 LAB — DIFFERENTIAL
Basophils Absolute: 0 10*3/uL (ref 0.0–0.1)
Eosinophils Relative: 0 % (ref 0–5)
Lymphocytes Relative: 13 % (ref 12–46)
Lymphs Abs: 1.6 10*3/uL (ref 0.7–4.0)
Neutro Abs: 9.3 10*3/uL — ABNORMAL HIGH (ref 1.7–7.7)
Neutrophils Relative %: 78 % — ABNORMAL HIGH (ref 43–77)

## 2012-01-10 LAB — COMPREHENSIVE METABOLIC PANEL
ALT: 20 U/L (ref 0–35)
AST: 36 U/L (ref 0–37)
Alkaline Phosphatase: 153 U/L — ABNORMAL HIGH (ref 39–117)
CO2: 23 mEq/L (ref 19–32)
Calcium: 8.8 mg/dL (ref 8.4–10.5)
GFR calc Af Amer: 5 mL/min — ABNORMAL LOW (ref >90)
GFR calc non Af Amer: 4 mL/min — ABNORMAL LOW (ref >90)
Glucose, Bld: 94 mg/dL (ref 70–99)
Potassium: 3.5 mEq/L (ref 3.5–5.1)
Sodium: 136 mEq/L (ref 135–145)

## 2012-01-10 LAB — CBC
MCHC: 33.5 g/dL (ref 30.0–36.0)
MCV: 90.3 fL (ref 78.0–100.0)
MCV: 90.8 fL (ref 78.0–100.0)
Platelets: 174 10*3/uL (ref 150–400)
Platelets: 163 10*3/uL (ref 150–400)
RBC: 4.24 MIL/uL (ref 3.87–5.11)
RDW: 14.2 % (ref 11.5–15.5)
RDW: 14.4 % (ref 11.5–15.5)
WBC: 11.9 10*3/uL — ABNORMAL HIGH (ref 4.0–10.5)
WBC: 10.2 10*3/uL (ref 4.0–10.5)

## 2012-01-10 LAB — EXPECTORATED SPUTUM ASSESSMENT W GRAM STAIN, RFLX TO RESP C

## 2012-01-10 LAB — CULTURE, RESPIRATORY W GRAM STAIN: Culture: NORMAL

## 2012-01-10 LAB — CREATININE, SERUM
GFR calc Af Amer: 5 mL/min — ABNORMAL LOW (ref >90)
GFR calc non Af Amer: 4 mL/min — ABNORMAL LOW (ref >90)

## 2012-01-10 MED ORDER — HEPARIN SODIUM (PORCINE) 1000 UNIT/ML DIALYSIS: 1000.0000 [IU] | INTRAMUSCULAR | Status: DC | PRN

## 2012-01-10 MED ORDER — NEPRO/CARBSTEADY PO LIQD: 237.0000 mL | Freq: Three times a day (TID) | ORAL | Status: DC | PRN

## 2012-01-10 MED ORDER — LIDOCAINE HCL (PF) 1 % IJ SOLN: 5.0000 mL | INTRAMUSCULAR | Status: DC | PRN

## 2012-01-10 MED ORDER — RENA-VITE PO TABS
1.0000 | ORAL_TABLET | Freq: Every day | ORAL | Status: DC
  Administered 2012-01-10 – 2012-01-12 (×2): 1 via ORAL
  Filled 2012-01-10 (×3): qty 1

## 2012-01-10 MED ORDER — ALBUTEROL SULFATE (5 MG/ML) 0.5% IN NEBU
5.0000 mg | INHALATION_SOLUTION | Freq: Once | RESPIRATORY_TRACT | Status: AC
  Administered 2012-01-10: 5 mg via RESPIRATORY_TRACT
  Filled 2012-01-10: qty 1

## 2012-01-10 MED ORDER — METHYLPREDNISOLONE SODIUM SUCC 40 MG IJ SOLR
40.0000 mg | Freq: Four times a day (QID) | INTRAMUSCULAR | Status: DC
  Administered 2012-01-10 – 2012-01-11 (×5): 40 mg via INTRAVENOUS
  Filled 2012-01-10 (×8): qty 1

## 2012-01-10 MED ORDER — HYDROXYZINE HCL 25 MG PO TABS: 25.0000 mg | ORAL_TABLET | Freq: Three times a day (TID) | ORAL | Status: DC | PRN

## 2012-01-10 MED ORDER — ACETAMINOPHEN 650 MG RE SUPP: 650.0000 mg | Freq: Four times a day (QID) | RECTAL | Status: DC | PRN

## 2012-01-10 MED ORDER — ONDANSETRON HCL 4 MG/2ML IJ SOLN: 4.0000 mg | Freq: Four times a day (QID) | INTRAMUSCULAR | Status: DC | PRN

## 2012-01-10 MED ORDER — DOCUSATE SODIUM 283 MG RE ENEM: 1.0000 | ENEMA | RECTAL | Status: DC | PRN

## 2012-01-10 MED ORDER — CAMPHOR-MENTHOL 0.5-0.5 % EX LOTN: 1.0000 "application " | TOPICAL_LOTION | Freq: Three times a day (TID) | CUTANEOUS | Status: DC | PRN

## 2012-01-10 MED ORDER — ASPIRIN 81 MG PO TBEC: 81.0000 mg | DELAYED_RELEASE_TABLET | Freq: Every day | ORAL | Status: DC

## 2012-01-10 MED ORDER — IPRATROPIUM BROMIDE 0.02 % IN SOLN
0.5000 mg | Freq: Once | RESPIRATORY_TRACT | Status: AC
  Administered 2012-01-10: 0.5 mg via RESPIRATORY_TRACT
  Filled 2012-01-10: qty 2.5

## 2012-01-10 MED ORDER — LEVOFLOXACIN IN D5W 500 MG/100ML IV SOLN
500.0000 mg | Freq: Every day | INTRAVENOUS | Status: DC
  Filled 2012-01-10: qty 100

## 2012-01-10 MED ORDER — LEVOFLOXACIN IN D5W 500 MG/100ML IV SOLN
500.0000 mg | INTRAVENOUS | Status: DC
  Filled 2012-01-10: qty 100

## 2012-01-10 MED ORDER — ATENOLOL 50 MG PO TABS
50.0000 mg | ORAL_TABLET | Freq: Every day | ORAL | Status: DC
  Administered 2012-01-12: 50 mg via ORAL
  Filled 2012-01-10 (×2): qty 1

## 2012-01-10 MED ORDER — OSELTAMIVIR PHOSPHATE 75 MG PO CAPS
75.0000 mg | ORAL_CAPSULE | ORAL | Status: AC
  Administered 2012-01-10: 75 mg via ORAL
  Filled 2012-01-10: qty 1

## 2012-01-10 MED ORDER — ZOLPIDEM TARTRATE 5 MG PO TABS: 5.0000 mg | ORAL_TABLET | Freq: Every evening | ORAL | Status: DC | PRN

## 2012-01-10 MED ORDER — ALTEPLASE 2 MG IJ SOLR: 2.0000 mg | Freq: Once | INTRAMUSCULAR | Status: AC | PRN

## 2012-01-10 MED ORDER — SODIUM CHLORIDE 0.9 % IV SOLN: 100.0000 mL | INTRAVENOUS | Status: DC | PRN

## 2012-01-10 MED ORDER — ALBUTEROL SULFATE (5 MG/ML) 0.5% IN NEBU
2.5000 mg | INHALATION_SOLUTION | RESPIRATORY_TRACT | Status: DC | PRN
  Administered 2012-01-12: 2.5 mg via RESPIRATORY_TRACT
  Filled 2012-01-10: qty 0.5

## 2012-01-10 MED ORDER — SODIUM CHLORIDE 0.9 % IV SOLN
INTRAVENOUS | Status: DC
  Administered 2012-01-10: 13:00:00 via INTRAVENOUS

## 2012-01-10 MED ORDER — ONDANSETRON HCL 4 MG PO TABS: 4.0000 mg | ORAL_TABLET | Freq: Four times a day (QID) | ORAL | Status: DC | PRN

## 2012-01-10 MED ORDER — PENTAFLUOROPROP-TETRAFLUOROETH EX AERO: 1.0000 "application " | INHALATION_SPRAY | CUTANEOUS | Status: DC | PRN

## 2012-01-10 MED ORDER — CALCIUM CARBONATE 1250 MG/5ML PO SUSP: 500.0000 mg | Freq: Four times a day (QID) | ORAL | Status: DC | PRN

## 2012-01-10 MED ORDER — ASPIRIN EC 81 MG PO TBEC
81.0000 mg | DELAYED_RELEASE_TABLET | Freq: Every day | ORAL | Status: DC
  Administered 2012-01-10 – 2012-01-12 (×2): 81 mg via ORAL
  Filled 2012-01-10 (×3): qty 1

## 2012-01-10 MED ORDER — GABAPENTIN 100 MG PO CAPS
100.0000 mg | ORAL_CAPSULE | Freq: Three times a day (TID) | ORAL | Status: DC
  Administered 2012-01-10 – 2012-01-12 (×6): 100 mg via ORAL
  Filled 2012-01-10 (×9): qty 1

## 2012-01-10 MED ORDER — PANTOPRAZOLE SODIUM 40 MG PO TBEC
40.0000 mg | DELAYED_RELEASE_TABLET | Freq: Every day | ORAL | Status: DC
  Administered 2012-01-10 – 2012-01-12 (×2): 40 mg via ORAL
  Filled 2012-01-10 (×2): qty 1

## 2012-01-10 MED ORDER — LEVOFLOXACIN IN D5W 750 MG/150ML IV SOLN
750.0000 mg | Freq: Once | INTRAVENOUS | Status: AC
  Administered 2012-01-10: 750 mg via INTRAVENOUS
  Filled 2012-01-10: qty 150

## 2012-01-10 MED ORDER — ACETAMINOPHEN 325 MG PO TABS
650.0000 mg | ORAL_TABLET | Freq: Once | ORAL | Status: AC
  Administered 2012-01-10: 650 mg via ORAL
  Filled 2012-01-10: qty 2

## 2012-01-10 MED ORDER — ACETAMINOPHEN 325 MG PO TABS: 650.0000 mg | ORAL_TABLET | Freq: Four times a day (QID) | ORAL | Status: DC | PRN

## 2012-01-10 MED ORDER — LIDOCAINE-PRILOCAINE 2.5-2.5 % EX CREA: 1.0000 "application " | TOPICAL_CREAM | CUTANEOUS | Status: DC | PRN

## 2012-01-10 MED ORDER — SORBITOL 70 % SOLN: 30.0000 mL | Status: DC | PRN

## 2012-01-10 MED ORDER — RANOLAZINE ER 500 MG PO TB12
500.0000 mg | ORAL_TABLET | Freq: Every day | ORAL | Status: DC
  Administered 2012-01-10: 500 mg via ORAL
  Filled 2012-01-10: qty 1

## 2012-01-10 MED ORDER — ATENOLOL 50 MG PO TABS
50.0000 mg | ORAL_TABLET | Freq: Every day | ORAL | Status: DC
Start: 2012-01-10 — End: 2012-01-10
  Administered 2012-01-10: 50 mg via ORAL
  Filled 2012-01-10: qty 1

## 2012-01-10 MED ORDER — HEPARIN SODIUM (PORCINE) 5000 UNIT/ML IJ SOLN
5000.0000 [IU] | Freq: Three times a day (TID) | INTRAMUSCULAR | Status: DC
  Administered 2012-01-10 – 2012-01-12 (×4): 5000 [IU] via SUBCUTANEOUS
  Filled 2012-01-10 (×9): qty 1

## 2012-01-10 NOTE — H&P (Signed)
History and Physical  Kim Hodge ZOX:[id number] DOB: [birth year] DOA: 01/10/2012  Referring physician: Dr. Anitra Lauth  PCP/Nephrology: Dierdre Highman, MD  Chief Complaint: Shortness of breath.  HPI:  56 year old woman presented to the emergency department with flulike symptoms for one week. Nausea, vomiting, diarrhea, fever, chills, congestion, productive cough. Noted to be febrile in the emergency department with a temperature of 101.1. Tachycardic and tachypneic. She was initially breathing 30 times a minute and quite tachypneic. This is subsequently improved with treatment. She was felt to be stable for addition to the medical floor.  She reports poor oral intake secondary to vomiting. She has tried over-the-counter medications without relief. No antibiotics.  Seen January 11 for declot procedure. However was febrile and noted to have flu symptoms for 3 days. Therefore temporary dialysis catheter was placed and declot procedure deferred. Dialysis catheter was subsequently removed.  Review of Systems:  Positive for: fever, pleuritic chest pain, hemorrhoidal bleeding, SOB Negative for: Changes to her vision, sore throat, rash, muscle aches, dysuria (she still makes urine), abdominal pain.   Past Medical History  Diagnosis Date  . Hypertension   . Hyperlipidemia   . Obesity   . Chronic kidney disease   . Fibrosis of uterus   . Membranoproliferative glomerulonephritis     type 1  . Diabetes mellitus     reports resolved  . Anemia   . Chronic bronchitis   . Degenerative joint disease   . Hyperparathyroidism, secondary renal   . ESRD (end stage renal disease)    Past Surgical History  Procedure Date  . Av fistula placement 01/02/10  . Abdominal hysterectomy   . Knee arthroscopy right knee 2006   Social History:  reports that she has been smoking Cigarettes.  She has a 10 pack-year smoking history. She does not have any smokeless tobacco history on file. She reports that she  uses illicit drugs (Marijuana). She reports that she does not drink alcohol.  Allergies  Allergen Reactions  . Ace Inhibitors     REACTION: angioedema    Family History  Problem Relation Age of Onset  . Cancer Mother     breast  . Stroke Father     Prior to Admission medications   Medication Sig Start Date End Date Taking? Authorizing Provider  aspirin 81 MG EC tablet Take 81 mg by mouth daily.     Yes Historical Provider, MD  atenolol (TENORMIN) 50 MG tablet Take 50 mg by mouth daily.     Yes Historical Provider, MD  lisinopril (PRINIVIL,ZESTRIL) 40 MG tablet Take 20 mg by mouth daily.   Yes Historical Provider, MD  Calcium Acetate 667 MG TABS Take 3 capsules by mouth 3 (three) times daily.      Historical Provider, MD  cinacalcet (SENSIPAR) 30 MG tablet Take 30 mg by mouth daily.      Historical Provider, MD  dexlansoprazole (DEXILANT) 60 MG capsule Take 60 mg by mouth daily as needed.      Historical Provider, MD  docusate sodium (COLACE) 100 MG capsule Take 100 mg by mouth as needed.      Historical Provider, MD  ferrous sulfate 325 (65 FE) MG tablet Take 325 mg by mouth 2 (two) times daily with a meal.      Historical Provider, MD  folic acid-vitamin b complex-vitamin c-selenium-zinc (DIALYVITE) 3 MG TABS Take 1 tablet by mouth daily.      Historical Provider, MD  gabapentin (NEURONTIN) 100 MG capsule Take 100 mg by  mouth 3 (three) times daily. TAKE 2 TABLETS THREE TIMES DAILY.     Historical Provider, MD  HYDROcodone-acetaminophen (NORCO) 5-325 MG per tablet Take 2 tablets by mouth every 4 (four) hours as needed.      Historical Provider, MD  lanthanum (FOSRENOL) 1000 MG chewable tablet Chew 1,000 mg by mouth 2 (two) times daily with a meal. TAKES 2 TABLETS WITH EACH MEAL.     Historical Provider, MD  metolazone (ZAROXOLYN) 5 MG tablet Take 5 mg by mouth daily.      Historical Provider, MD  pravastatin (PRAVACHOL) 20 MG tablet Take 20 mg by mouth daily.      Historical Provider,  MD  promethazine (PHENERGAN) 25 MG tablet Take 25 mg by mouth every 4 (four) hours as needed.      Historical Provider, MD  ranolazine (RANEXA) 500 MG 12 hr tablet Take 500 mg by mouth daily.      Historical Provider, MD   Physical Exam:  Filed Vitals:   01/10/12 0732  BP: 146/87  Pulse: 117  Temp: 101.1 F (38.4 C)  TempSrc: Rectal  Resp: 30  SpO2: 99%     General:  Appears calm. Smiles. Laughs. Appears moderately ill.  Eyes: Pupils equal, round and react to light. Normal lids, irises and conjunctivae.  ENT: Grossly normal hearing. Normal lips and tongue.  Neck: No lymphadenopathy or masses. No thyromegaly.  Cardiovascular: Tachycardic. Regular rhythm. No murmur, rub or gallop. No lower extremity edema.  Respiratory: Diffuse wheezes bilaterally. Mild to moderate increased work of breathing. Able to speak in full sentences. No frank rhonchi or rales.  Abdomen: Soft, nontender, nondistended. Large2 ventral hernia noted.  Skin: Appears grossly unremarkable.  Musculoskeletal: Grossly normal tone.  Psychiatric: Grossly normal mood and affect. Speech fluent and appropriate.  Neurologic: Grossly normal.  Labs on Admission:  Basic Metabolic Panel:  Lab 01/10/12 1914 01/07/12 1442  NA 136 137  K 3.5 4.2  CL 96 --  CO2 23 --  GLUCOSE 94 84  BUN 30* --  CREATININE 8.86* --  CALCIUM 8.8 --  MG -- --  PHOS -- --   Liver Function Tests:  Lab 01/10/12 0719  AST 36  ALT 20  ALKPHOS 153*  BILITOT 0.2*  PROT 6.8  ALBUMIN 2.7*   CBC:  Lab 01/10/12 0719 01/07/12 1442  WBC 11.9* --  NEUTROABS 9.3* --  HGB 13.1 13.6  HCT 38.3 40.0  MCV 90.3 --  PLT 174 --   Radiological Exams on Admission: Dg Chest 2 View  01/10/2012  *RADIOLOGY REPORT*  Clinical Data: Cough, shortness of breath.  CHEST - 2 VIEW  Comparison: 08/23/2011  Findings: Right dialysis catheter has been removed. Heart and mediastinal contours are within normal limits.  No focal opacities or effusions.   No acute bony abnormality.  Mild peribronchial thickening.  IMPRESSION: Bronchitic changes.  Original Report Authenticated By: Cyndie Chime, M.D.    EKG: Independently reviewed. Sinus tachycardia. Inferior MI, old. No acute changes.  Assessment/Plan 1. Febrile illness/acute bronchitis: Start IV antibiotics. Suspect early pneumonia although there is no radiographic evidence of this. Her symptoms of nausea, vomiting, diarrhea shortness of breath and cough argue for viral illness. Given her history however will check blood cultures. 2. Nausea/vomiting/diarrhea: Supportive care. IV fluids. Contact precautions. No leukocytosis. C. difficile doubted. No abdominal pain, therefore no imaging at this time. 3. Hypertension: Stable. 4. End-stage renal disease: Monday Wednesday Friday. Discussed the case with Dr. Darrick Penna who will see the patient  in consultation for hemodialysis. 5. Smoker: Wants to quit.  Code Status: Full code  Disposition Plan: Pending further evaluation.  Brendia Sacks, MD  [facility] Pager [phone] 01/10/2012, 8:43 AM

## 2012-01-10 NOTE — Consult Note (Signed)
[facility] Renal Consultation Note  Indication for Consultation:  Management of ESRD/hemodialysis; anemia, hypertension/volume and secondary hyperparathyroidism  HPI: Kim Hodge is a 56 y.o.AA female with ESRD on chronic HD support every MWF at [facility] who reports having upper respiratory and "flu-like symptoms" including N/V/D and a productive cough since Wed 1/9/3 of last week. She does report being exposed to family and other ESRD patients who "have had the flu". She was also clotted on arrival to HD on Friday and due to her febrile illness was not declotted in IR but had a temporary HD catheter placed. She returned to her outpt center and successfully underwent hemodialysis off schedule on Saturday and then returned to the hospital for catheter removal. Early this am her respiratory status and symptoms worsened and she presented to the ED for further treatment and evaluation as well as the need for antithrombolysis of her clotted dialysis access. Renal Consult ongoing RRT. Plans to schedule declot in am (IR) and HD tomorrow (off schedule) afterwards. No dizziness with standing, no V, and no D for 24 hours.  Smoker 41 pk, yr, has not smoked for 6 days. Dialysis Orders: Center: [facility] on MWF. EDW 84Kg  HD Bath 3K/2.25Ca  Time 3:45 Heparin 7,500u. Access LUA AVG BFR 400 DFR A1.5    Zemplar 5 mcg IV/HD Epogen none  Units IV/HD  Venofer  none  Other UF profile 4; Optiflux 160 NRE; #15g; DO NOT ADD Rinseback to goal  Past Medical History  Diagnosis Date  . Hypertension   . Hyperlipidemia   . Obesity   . Chronic kidney disease   . Fibrosis of uterus   . Membranoproliferative glomerulonephritis     type 1  . Diabetes mellitus     reports resolved  . Anemia   . Chronic bronchitis   . Degenerative joint disease   . Hyperparathyroidism, secondary renal   . ESRD (end stage renal disease)    Past Surgical History  Procedure Date  . Av fistula placement 01/02/10  . Abdominal  hysterectomy   . Knee arthroscopy right knee 2006   Family History  Problem Relation Age of Onset  . Cancer Mother     breast  . Stroke Father     reports that she has been smoking Cigarettes.  She has a 10 pack-year smoking history. She does not have any smokeless tobacco history on file. She reports that she uses illicit drugs (Marijuana). She reports that she does not drink alcohol. Allergies  Allergen Reactions  . Ace Inhibitors     REACTION: angioedema   Prior to Admission medications   Medication Sig Start Date End Date Taking? Authorizing Provider  aspirin 81 MG EC tablet Take 81 mg by mouth daily.     Yes Historical Provider, MD  atenolol (TENORMIN) 50 MG tablet Take 50 mg by mouth daily.     Yes Historical Provider, MD  lisinopril (PRINIVIL,ZESTRIL) 40 MG tablet Take 20 mg by mouth daily.   Yes Historical Provider, MD  Calcium Acetate 667 MG TABS Take 3 capsules by mouth 3 (three) times daily.      Historical Provider, MD  cinacalcet (SENSIPAR) 30 MG tablet Take 30 mg by mouth daily.      Historical Provider, MD  dexlansoprazole (DEXILANT) 60 MG capsule Take 60 mg by mouth daily as needed.      Historical Provider, MD  docusate sodium (COLACE) 100 MG capsule Take 100 mg by mouth as needed.  Historical Provider, MD  ferrous sulfate 325 (65 FE) MG tablet Take 325 mg by mouth 2 (two) times daily with a meal.      Historical Provider, MD  folic acid-vitamin b complex-vitamin c-selenium-zinc (DIALYVITE) 3 MG TABS Take 1 tablet by mouth daily.      Historical Provider, MD  gabapentin (NEURONTIN) 100 MG capsule Take 100 mg by mouth 3 (three) times daily. TAKE 2 TABLETS THREE TIMES DAILY.     Historical Provider, MD  HYDROcodone-acetaminophen (NORCO) 5-325 MG per tablet Take 2 tablets by mouth every 4 (four) hours as needed.      Historical Provider, MD  lanthanum (FOSRENOL) 1000 MG chewable tablet Chew 1,000 mg by mouth 2 (two) times daily with a meal. TAKES 2 TABLETS WITH EACH  MEAL.     Historical Provider, MD  metolazone (ZAROXOLYN) 5 MG tablet Take 5 mg by mouth daily.      Historical Provider, MD  pravastatin (PRAVACHOL) 20 MG tablet Take 20 mg by mouth daily.      Historical Provider, MD  promethazine (PHENERGAN) 25 MG tablet Take 25 mg by mouth every 4 (four) hours as needed.      Historical Provider, MD  ranolazine (RANEXA) 500 MG 12 hr tablet Take 500 mg by mouth daily.      Historical Provider, MD   I have reviewed the patient's current medications. Results for orders placed during the hospital encounter of 01/10/12 (from the past 48 hour(s))  CBC     Status: Abnormal   Collection Time   01/10/12  7:19 AM      Component Value Range Comment   WBC 11.9 (*) 4.0 - 10.5 (K/uL)    RBC 4.24  3.87 - 5.11 (MIL/uL)    Hemoglobin 13.1  12.0 - 15.0 (g/dL)    HCT 40.9  81.1 - 91.4 (%)    MCV 90.3  78.0 - 100.0 (fL)    MCH 30.9  26.0 - 34.0 (pg)    MCHC 34.2  30.0 - 36.0 (g/dL)    RDW 78.2  95.6 - 21.3 (%)    Platelets 174  150 - 400 (K/uL)   DIFFERENTIAL     Status: Abnormal   Collection Time   01/10/12  7:19 AM      Component Value Range Comment   Neutrophils Relative 78 (*) 43 - 77 (%)    Neutro Abs 9.3 (*) 1.7 - 7.7 (K/uL)    Lymphocytes Relative 13  12 - 46 (%)    Lymphs Abs 1.6  0.7 - 4.0 (K/uL)    Monocytes Relative 8  3 - 12 (%)    Monocytes Absolute 0.9  0.1 - 1.0 (K/uL)    Eosinophils Relative 0  0 - 5 (%)    Eosinophils Absolute 0.0  0.0 - 0.7 (K/uL)    Basophils Relative 0  0 - 1 (%)    Basophils Absolute 0.0  0.0 - 0.1 (K/uL)   COMPREHENSIVE METABOLIC PANEL     Status: Abnormal   Collection Time   01/10/12  7:19 AM      Component Value Range Comment   Sodium 136  135 - 145 (mEq/L)    Potassium 3.5  3.5 - 5.1 (mEq/L)    Chloride 96  96 - 112 (mEq/L)    CO2 23  19 - 32 (mEq/L)    Glucose, Bld 94  70 - 99 (mg/dL)    BUN 30 (*) 6 - 23 (mg/dL)    Creatinine,  Ser 8.86 (*) 0.50 - 1.10 (mg/dL)    Calcium 8.8  8.4 - 10.5 (mg/dL)    Total Protein  6.8  6.0 - 8.3 (g/dL)    Albumin 2.7 (*) 3.5 - 5.2 (g/dL)    AST 36  0 - 37 (U/L)    ALT 20  0 - 35 (U/L)    Alkaline Phosphatase 153 (*) 39 - 117 (U/L)    Total Bilirubin 0.2 (*) 0.3 - 1.2 (mg/dL)    GFR calc non Af Amer 4 (*) >90 (mL/min)    GFR calc Af Amer 5 (*) >90 (mL/min)   INFLUENZA PANEL BY PCR     Status: Normal   Collection Time   01/10/12  8:45 AM      Component Value Range Comment   Influenza A By PCR NEGATIVE  NEGATIVE     Influenza B By PCR NEGATIVE  NEGATIVE     H1N1 flu by pcr NOT DETECTED  NOT DETECTED     ROS: as noted in HPI; otherwise negative   Physical Exam: Filed Vitals:   01/10/12 0931  BP: 116/73  Pulse:   Temp:   Resp: 16     General: comfortable Heart: RRR, Soft 2/6 SEM Lungs: Rhonchi, wheezes scattered bilat  Abdomen: soft, slight epigastric tenderness/ND, +BS Extremities: no ankle edema Skin: intact Neuro: alert, Ox3, pleasant affect Dialysis Access: clotted LUA AVG; neg T/B  Assessment/Plan: 1. FUO- mildly elevated WBC 11.9; rapid FLU negative; Bld Cx pend; CXR evidence bronchitis (mild peribronchial thickenuing); no evidence Pneumonuia- on IV solumedrol, atrovent, provental nebs; and IV Levaquin; empiric Tamiflu . S 2. ESRD -  MWF [facility]; HD off schedule (last tx Sat); next treatment tomorrow . Suspect viral syndrome.  Cover with AB transiently.   3. Vascular Access- has clotted AVG; no signs graft infection; scheduled for declot in am IR (0900) 4. Hypokalemia- K 3.5; chronic low on 3K bath outpt setting; N/V/D improving; cont same and follow 4. Hypertension/volume  - controlled with meds (Micardis) and UF on HD; under DW and volume depleted; keep even with HD tomorrow  5. Anemia  - Hgb 11.9; prev 13.0 on 01/05/12; no ESA or iron outpt setting; follow closely 6. Metabolic bone disease -  Sensipar, Fosrenol, Tums Ultra outpt setting; recent ^ iPTH 719.4 on 11/24/11; follow closely 7. DM/HLD- (history steroid induced DM) CBG and SSI; most recent  Hgb A1c 5.3% on 10/20/11 Pravastatin qhs 8. Insomnia- Trazadone qhs outpt setting 9. Nutrition - albumin 2.7, appetite poor, recent N/V/D; tol clear liquids 10. Disposition- per primary services * will discontinue meds noted med-rec not currently on outpt setting (Ranexa)  Samuel Germany, FNP-C [facility] Pager [phone] 01/10/2012, 11:30 AM I have seen and examined this patient and agree with the plan of care   Payslie Mccaig L 01/10/2012, 2:37 PM   Attending Nephrologist: Dr. Fayrene Fearing Isyss Espinal

## 2012-01-10 NOTE — ED Notes (Signed)
Patient resting, reporting body aches, chills and shortness of breath.  Noted to be in sinus tachycardia with a productive cough.  Patient denies chest pain, n/v.  Rhonchi noted bilaterally with diminished sounds to bases.

## 2012-01-10 NOTE — ED Notes (Signed)
Pt reports flu like symptoms x 1 week.  N/V/Diarrhea, fever, chills, congestion, productive cough.  Pt speaking in complete sentences, no distress noted.

## 2012-01-10 NOTE — ED Notes (Signed)
MD at bedside. 

## 2012-01-10 NOTE — Progress Notes (Signed)
Pharmacy--Levaquin Renal Dose Adjustment  OBJECTIVE:  ESRD with HD MWF  ASSESSMENT:  56 y.o. F on Levaquin empirically for PNA coverage requiring dose adjustment for ESRD.   PLAN:  1. Will change Levaquin to 750 mg x 1 to load followed by  500 mg IV q48 hours for proper adjustment for ESRD (will keep stop date after 7 days as previously ordered) 2. No further Levaquin dose adjustments expected so will sign off of Levaquin and monitor for any adjustments needed for additional antibiotics (if ordered) 3. Monitor clinical status and consider change from IV to po as the patient improves.   Georgina Pillion, PharmD, BCPS 01/10/2012 11:25 AM

## 2012-01-10 NOTE — ED Notes (Signed)
See triage note by same RN.  States that she had a PICC line placed Friday removed Saturday and is supposed to be evaluated for another PICC line today.  Pt noted to be coughing.  Rhonchi noted.  Dialysis pt M-W-F.  Skin warm, dry and intact.  Neuro intact.

## 2012-01-10 NOTE — Progress Notes (Signed)
Observation review placed in CM handoff.

## 2012-01-10 NOTE — ED Provider Notes (Addendum)
History     CSN: [id number]  Arrival date & time 01/10/12  7829   First MD Initiated Contact with Patient 01/10/12 [phone]      Chief Complaint  Patient presents with  . Influenza    (Consider location/radiation/quality/duration/timing/severity/associated sxs/prior treatment) Patient is a 56 y.o. female presenting with flu symptoms. The history is provided by the patient.  Influenza This is a new problem. Episode onset: 6 days ago. The problem occurs constantly. The problem has been gradually worsening. Associated symptoms include shortness of breath. Pertinent negatives include no chest pain. Associated symptoms comments: Fever, vomiting, diarrhea, cough productive of yellow sputum. The symptoms are aggravated by coughing and smoking. The symptoms are relieved by nothing. Treatments tried: Cold medication. The treatment provided no relief.    Past Medical History  Diagnosis Date  . Hypertension   . Hyperlipidemia   . Obesity   . Chronic kidney disease   . Fibrosis of uterus   . Membranoproliferative glomerulonephritis     type 1  . Diabetes mellitus   . Anemia   . Chronic bronchitis   . Degenerative joint disease   . Hyperparathyroidism, secondary renal   . ESRD (end stage renal disease)     Past Surgical History  Procedure Date  . Av fistula placement 01/02/10  . Abdominal hysterectomy   . Knee arthroscopy right knee 2006    Family History  Problem Relation Age of Onset  . Cancer Mother     breast  . Stroke Father     History  Substance Use Topics  . Smoking status: Current Some Day Smoker -- 0.2 packs/day for 40 years    Types: Cigarettes  . Smokeless tobacco: Not on file  . Alcohol Use: No    OB History    Grav Para Term Preterm Abortions TAB SAB Ect Mult Living                  Review of Systems  Constitutional: Positive for fever and chills. Negative for appetite change.  HENT: Positive for congestion and rhinorrhea. Negative for sinus pressure.     Respiratory: Positive for cough and shortness of breath. Negative for chest tightness.   Cardiovascular: Negative for chest pain and leg swelling.  Gastrointestinal: Positive for nausea, vomiting and diarrhea.  Neurological: Negative for weakness.  All other systems reviewed and are negative.    Allergies  Ace inhibitors  Home Medications   Current Outpatient Rx  Name Route Sig Dispense Refill  . ASPIRIN 81 MG PO TBEC Oral Take 81 mg by mouth daily.      . ATENOLOL 50 MG PO TABS Oral Take 50 mg by mouth daily.      Marland Kitchen LISINOPRIL 40 MG PO TABS Oral Take 20 mg by mouth daily.    Marland Kitchen CALCIUM ACETATE 667 MG PO TABS Oral Take 3 capsules by mouth 3 (three) times daily.      Marland Kitchen CINACALCET HCL 30 MG PO TABS Oral Take 30 mg by mouth daily.      . DEXLANSOPRAZOLE 60 MG PO CPDR Oral Take 60 mg by mouth daily as needed.      Marland Kitchen DOCUSATE SODIUM 100 MG PO CAPS Oral Take 100 mg by mouth as needed.      Marland Kitchen FERROUS SULFATE 325 (65 FE) MG PO TABS Oral Take 325 mg by mouth 2 (two) times daily with a meal.      . DIALYVITE 3000 3 MG PO TABS Oral Take 1 tablet by  mouth daily.      Marland Kitchen GABAPENTIN 100 MG PO CAPS Oral Take 100 mg by mouth 3 (three) times daily. TAKE 2 TABLETS THREE TIMES DAILY.     Marland Kitchen HYDROCODONE-ACETAMINOPHEN 5-325 MG PO TABS Oral Take 2 tablets by mouth every 4 (four) hours as needed.      Marland Kitchen LANTHANUM CARBONATE 1000 MG PO CHEW Oral Chew 1,000 mg by mouth 2 (two) times daily with a meal. TAKES 2 TABLETS WITH EACH MEAL.     Marland Kitchen METOLAZONE 5 MG PO TABS Oral Take 5 mg by mouth daily.      Marland Kitchen PRAVASTATIN SODIUM 20 MG PO TABS Oral Take 20 mg by mouth daily.      Marland Kitchen PROMETHAZINE HCL 25 MG PO TABS Oral Take 25 mg by mouth every 4 (four) hours as needed.      Marland Kitchen RANOLAZINE ER 500 MG PO TB12 Oral Take 500 mg by mouth daily.        There were no vitals taken for this visit.  Physical Exam  Nursing note and vitals reviewed. Constitutional: She is oriented to person, place, and time. She appears  well-developed and well-nourished. She appears distressed.  HENT:  Head: Normocephalic and atraumatic.  Nose: Mucosal edema and rhinorrhea present.  Mouth/Throat: Oropharynx is clear and moist and mucous membranes are normal.  Eyes: EOM are normal. Pupils are equal, round, and reactive to light.  Neck: Normal range of motion. Neck supple.  Cardiovascular: Regular rhythm, normal heart sounds and intact distal pulses.  Tachycardia present.  Exam reveals no friction rub.   No murmur heard. Pulmonary/Chest: Tachypnea noted. She has no decreased breath sounds. She has wheezes. She has rhonchi. She has no rales.  Abdominal: Soft. Bowel sounds are normal. She exhibits no distension. There is no tenderness. There is no rebound and no guarding.  Musculoskeletal: Normal range of motion. She exhibits no edema and no tenderness.       No edema  Neurological: She is alert and oriented to person, place, and time. No cranial nerve deficit.  Skin: Skin is warm and dry. No rash noted.  Psychiatric: She has a normal mood and affect. Her behavior is normal.    ED Course  Procedures (including critical care time)  Labs Reviewed  CBC - Abnormal; Notable for the following:    WBC 11.9 (*)    All other components within normal limits  DIFFERENTIAL - Abnormal; Notable for the following:    Neutrophils Relative 78 (*)    Neutro Abs 9.3 (*)    All other components within normal limits  COMPREHENSIVE METABOLIC PANEL - Abnormal; Notable for the following:    BUN 30 (*)    Creatinine, Ser 8.86 (*)    Albumin 2.7 (*)    Alkaline Phosphatase 153 (*)    Total Bilirubin 0.2 (*)    GFR calc non Af Amer 4 (*)    GFR calc Af Amer 5 (*)    All other components within normal limits  INFLUENZA PANEL BY PCR   Dg Chest 2 View  01/10/2012  *RADIOLOGY REPORT*  Clinical Data: Cough, shortness of breath.  CHEST - 2 VIEW  Comparison: 08/23/2011  Findings: Right dialysis catheter has been removed. Heart and mediastinal  contours are within normal limits.  No focal opacities or effusions.  No acute bony abnormality.  Mild peribronchial thickening.  IMPRESSION: Bronchitic changes.  Original Report Authenticated By: Cyndie Chime, M.D.     Date: 01/10/2012  Rate: 117  Rhythm: sinus tachycardia  QRS Axis: normal  Intervals: normal  ST/T Wave abnormalities: nonspecific ST changes  Conduction Disutrbances:none  Narrative Interpretation:   Old EKG Reviewed: unchanged    1. Influenza       MDM   Patient with known end-stage renal disease who last had dialysis on Saturday, who presents today with complaints of productive cough, congestion, fever, nausea vomiting and diarrhea. Her biggest complaint today is feeling short of breath. She denies any chest pain or swelling. There is no signs of fluid overload today, but on exam has diffuse rhonchi with mild wheezes. Patient is a smoker but has had a flu shot and pneumonia shot this year. Vital signs significant for fever and tachycardia but satting 97% on room air however tachypnea. Concern for possible infectious etiology as a cause for her symptoms versus fluid overload as she is a dialysis patient. The symptoms suggestive of cardiac cause for symptoms. Also could be bacteremia given she recently had a vas cath removed but she states she only had it for one week while they were unable to use her fistula due to thrombophlebitis however the catheter has been out since Friday.  Will place patient on nasal cannula oxygen. Will give albuterol and Atrovent for symptomatic relief. CBC, CMP, EKG, chest x-ray pending.  8:34 AM Patient with mild elevation of white blood cell count of 11.9 with normal potassium and elevated creatinine as would be expected with end-stage renal disease. Febrile here to 101 and was given Tylenol. After albuterol and Atrovent and Tylenol heart rate improved to the 90s and O2 sats remained in the mid 90s on nasal cannula. However on reevaluation  the patient still tachetic. Chest x-ray without any focal signs of pneumonia. Will give Tamiflu and send a flu PCR and have the patient admitted for observation.      Gwyneth Sprout, MD 01/10/12 1610  Gwyneth Sprout, MD 01/10/12 [phone]

## 2012-01-10 NOTE — Progress Notes (Signed)
01/10/2012 Dreamer Carillo [facility] Case Management Note [phone]  Utilization review completed.  

## 2012-01-10 NOTE — ED Notes (Signed)
Pt placed in a gown, on monitor, with continuous blood pressure and pulse oximetry 

## 2012-01-10 NOTE — Progress Notes (Signed)
Pt admitted to floor 6700. Pt on tele running sinus rhythm. IV site in right AC clean dry intact. Pt SOB. Pt on dialysis MWF access in left arm. ++ bruit and thrill. HX diabetes, hypertension. PT alert and oriented x 4. Pt now has NS running at 60ml/hr.

## 2012-01-10 NOTE — ED Notes (Signed)
Patient to be admitted and is aware

## 2012-01-11 ENCOUNTER — Inpatient Hospital Stay (HOSPITAL_COMMUNITY): Payer: 59

## 2012-01-11 LAB — URINALYSIS, ROUTINE W REFLEX MICROSCOPIC
Glucose, UA: 100 mg/dL — AB
Ketones, ur: NEGATIVE mg/dL
Leukocytes, UA: NEGATIVE
Protein, ur: >300 mg/dL — AB
pH: 6.5 (ref 5.0–8.0)

## 2012-01-11 LAB — RENAL FUNCTION PANEL
BUN: 59 mg/dL — ABNORMAL HIGH (ref 6–23)
CO2: 23 mEq/L (ref 19–32)
Glucose, Bld: 107 mg/dL — ABNORMAL HIGH (ref 70–99)
Phosphorus: 7.1 mg/dL — ABNORMAL HIGH (ref 2.3–4.6)
Potassium: 4.4 mEq/L (ref 3.5–5.1)

## 2012-01-11 LAB — CBC
Hemoglobin: 12.3 g/dL (ref 12.0–15.0)
MCHC: 34.2 g/dL (ref 30.0–36.0)
Platelets: 201 10*3/uL (ref 150–400)
RDW: 14.2 % (ref 11.5–15.5)

## 2012-01-11 LAB — DIFFERENTIAL
Basophils Relative: 1 % (ref 0–1)
Eosinophils Absolute: 0 10*3/uL (ref 0.0–0.7)
Eosinophils Relative: 0 % (ref 0–5)
Monocytes Absolute: 0.4 10*3/uL (ref 0.1–1.0)
Neutro Abs: 9 10*3/uL — ABNORMAL HIGH (ref 1.7–7.7)
Neutrophils Relative %: 83 % — ABNORMAL HIGH (ref 43–77)

## 2012-01-11 LAB — URINE MICROSCOPIC-ADD ON

## 2012-01-11 MED ORDER — PREDNISONE 20 MG PO TABS
20.0000 mg | ORAL_TABLET | Freq: Every day | ORAL | Status: DC
  Administered 2012-01-12: 20 mg via ORAL
  Filled 2012-01-11 (×3): qty 1

## 2012-01-11 MED ORDER — LEVOFLOXACIN 500 MG PO TABS
500.0000 mg | ORAL_TABLET | ORAL | Status: DC
Start: 2012-01-12 — End: 2012-01-12
  Administered 2012-01-12: 500 mg via ORAL
  Filled 2012-01-11 (×3): qty 1

## 2012-01-11 MED ORDER — IOHEXOL 300 MG/ML  SOLN
60.0000 mL | Freq: Once | INTRAMUSCULAR | Status: AC | PRN
  Administered 2012-01-11: 60 mL via INTRAVENOUS

## 2012-01-11 MED ORDER — ALTEPLASE 2 MG IJ SOLR
2.0000 mg | Freq: Once | INTRAMUSCULAR | Status: DC
  Filled 2012-01-11: qty 2

## 2012-01-11 MED ORDER — HEPARIN SODIUM (PORCINE) 1000 UNIT/ML IJ SOLN
2000.0000 [IU] | Freq: Once | INTRAMUSCULAR | Status: DC
  Filled 2012-01-11: qty 2

## 2012-01-11 MED ORDER — PARICALCITOL 5 MCG/ML IV SOLN
INTRAVENOUS | Status: AC
  Administered 2012-01-11: 5 ug via INTRAVENOUS
  Filled 2012-01-11: qty 1

## 2012-01-11 MED ORDER — LEVOFLOXACIN 500 MG PO TABS
500.0000 mg | ORAL_TABLET | ORAL | Status: DC
  Filled 2012-01-11: qty 1

## 2012-01-11 MED ORDER — HEPARIN SODIUM (PORCINE) 1000 UNIT/ML IJ SOLN
INTRAMUSCULAR | Status: AC
  Administered 2012-01-11: 3000 [IU]/mL via INTRAVENOUS
  Filled 2012-01-11: qty 1

## 2012-01-11 MED ORDER — PARICALCITOL 5 MCG/ML IV SOLN
5.0000 ug | Freq: Once | INTRAVENOUS | Status: AC
  Administered 2012-01-11: 5 ug via INTRAVENOUS
  Filled 2012-01-11: qty 1

## 2012-01-11 MED ORDER — MIDAZOLAM HCL 5 MG/5ML IJ SOLN
INTRAMUSCULAR | Status: AC | PRN
  Administered 2012-01-11: 2 mg via INTRAVENOUS

## 2012-01-11 MED ORDER — FENTANYL CITRATE 0.05 MG/ML IJ SOLN
INTRAMUSCULAR | Status: AC | PRN
  Administered 2012-01-11 (×2): 50 ug via INTRAVENOUS

## 2012-01-11 NOTE — Progress Notes (Signed)
Pharmacy--Levaquin Renal Dose Adjustment  OBJECTIVE:  ESRD with HD MWF  ASSESSMENT:  56 y.o. F on Levaquin empirically for PNA coverage requiring dose adjustment for ESRD.   PLAN:  1. Changed Levaquin to 500 mg po q48h (will keep stop date after 7 days as previously ordered)  Loura Back, PharmD, BCPS 01/11/2012 5:02 PM

## 2012-01-11 NOTE — Progress Notes (Signed)
PROGRESS NOTE  Kim Hodge ZOX:[id number] DOB: 27-Dec-1956 DOA: 01/10/2012 PCP: Oliver Pila, MD  Brief narrative: 56 year old woman who present with a weeklong history of upper respiratory complaints suggestive of a viral illness. She had been scheduled to have an outpatient declotting procedure of her AV graft was admitted for further evaluation and treatment.  She was placed empirically on IV antibiotics given the chronicity of her symptoms although no definitive pneumonia was demonstrated on chest x-ray. She was also placed on steroids for wheezing and bronchitis. She is improved remarkably in 24 hours and the plan is for declot procedure today and subsequent dialysis. Discharge January 16 anticipated.  Past medical history: End-stage renal disease, diabetes mellitus, hypertension  Consultants:  Nephrology  Interventional radiology  Procedures:  January 15: Declot of AV graft  Routine hemodialysis  Antibiotics:  January 14: Levaquin  Interim History: Chart reviewed. Subjective: Feels better.  Objective: Filed Vitals:   01/10/12 1800 01/10/12 2100 01/11/12 0500 01/11/12 0945  BP: 130/89 144/93 152/95 128/76  Pulse: 65 66 65 77  Temp: 98.6 F (37 C) 97.9 F (36.6 C) 97.8 F (36.6 C) 98.4 F (36.9 C)  TempSrc: Oral Oral Oral Oral  Resp: 20 20 20 20   Height:      Weight:  82.4 kg (181 lb 10.5 oz)    SpO2: 98% 93% 93% 94%    Intake/Output Summary (Last 24 hours) at 01/11/12 1309 Last data filed at 01/11/12 0800  Gross per 24 hour  Intake    240 ml  Output      1 ml  Net    239 ml    Exam:  General: Appears quite well today. Cardiovascular: Regular rate and rhythm. No murmur, rub or gallop. No lower extremity edema. Respiratory: Clear to auscultation bilaterally. No wheezes, rales or rhonchi. Normal respiratory effort.  Data Reviewed: Basic Metabolic Panel:  Lab 01/10/12 8119 01/10/12 0719 01/07/12 1442  NA -- 136 137  K -- 3.5 4.2  CL -- 96  --  CO2 -- 23 --  GLUCOSE -- 94 84  BUN -- 30* --  CREATININE 9.20* 8.86* --  CALCIUM -- 8.8 --  MG -- -- --  PHOS -- -- --   Liver Function Tests:  Lab 01/10/12 0719  AST 36  ALT 20  ALKPHOS 153*  BILITOT 0.2*  PROT 6.8  ALBUMIN 2.7*   CBC:  Lab 01/10/12 1120 01/10/12 0719 01/07/12 1442  WBC 10.2 11.9* --  NEUTROABS -- 9.3* --  HGB 11.9* 13.1 13.6  HCT 35.5* 38.3 40.0  MCV 90.8 90.3 --  PLT 163 174 --   Recent Results (from the past 240 hour(s))  CULTURE, BLOOD (ROUTINE X 2)     Status: Normal (Preliminary result)   Collection Time   01/10/12 11:00 AM      Component Value Range Status Comment   Specimen Description BLOOD RIGHT HAND   Final    Special Requests BOTTLES DRAWN AEROBIC AND ANAEROBIC 10CC   Final    Setup Time [id number]   Final    Culture     Final    Value:        BLOOD CULTURE RECEIVED NO GROWTH TO DATE CULTURE WILL BE HELD FOR 5 DAYS BEFORE ISSUING A FINAL NEGATIVE REPORT   Report Status PENDING   Incomplete   CULTURE, BLOOD (ROUTINE X 2)     Status: Normal (Preliminary result)   Collection Time   01/10/12 11:20 AM  Component Value Range Status Comment   Specimen Description BLOOD RIGHT ANTECUBITAL   Final    Special Requests BOTTLES DRAWN AEROBIC AND ANAEROBIC 10C   Final    Setup Time [id number]   Final    Culture     Final    Value:        BLOOD CULTURE RECEIVED NO GROWTH TO DATE CULTURE WILL BE HELD FOR 5 DAYS BEFORE ISSUING A FINAL NEGATIVE REPORT   Report Status PENDING   Incomplete   CULTURE, SPUTUM-ASSESSMENT     Status: Normal   Collection Time   01/10/12  4:14 PM      Component Value Range Status Comment   Specimen Description SPUTUM   Final    Special Requests NONE   Final    Sputum evaluation     Final    Value: THIS SPECIMEN IS ACCEPTABLE. RESPIRATORY CULTURE REPORT TO FOLLOW.   Report Status 01/10/2012 FINAL   Final   CULTURE, RESPIRATORY     Status: Normal (Preliminary result)   Collection Time   01/10/12  4:14 PM       Component Value Range Status Comment   Specimen Description SPUTUM   Final    Special Requests NONE   Final    Gram Stain     Final    Value: FEW WBC PRESENT, PREDOMINANTLY PMN     FEW SQUAMOUS EPITHELIAL CELLS PRESENT     FEW GRAM POSITIVE COCCI     IN PAIRS   Culture Culture reincubated for better growth   Final    Report Status PENDING   Incomplete      Studies: Dg Chest 2 View  01/10/2012  *RADIOLOGY REPORT*  Clinical Data: Cough, shortness of breath.  CHEST - 2 VIEW  Comparison: 08/23/2011  Findings: Right dialysis catheter has been removed. Heart and mediastinal contours are within normal limits.  No focal opacities or effusions.  No acute bony abnormality.  Mild peribronchial thickening.  IMPRESSION: Bronchitic changes.  Original Report Authenticated By: Cyndie Chime, M.D.   Scheduled Meds:   . aspirin EC  81 mg Oral Daily  . atenolol  50 mg Oral QHS  . gabapentin  100 mg Oral TID  . heparin  5,000 Units Subcutaneous Q8H  . levofloxacin (LEVAQUIN) IV  500 mg Intravenous Q48H  . levofloxacin (LEVAQUIN) IV  750 mg Intravenous Once  . methylPREDNISolone (SOLU-MEDROL) injection  40 mg Intravenous Q6H  . multivitamin  1 tablet Oral QHS  . pantoprazole  40 mg Oral Q1200  . DISCONTD: atenolol  50 mg Oral Daily  . DISCONTD: ranolazine  500 mg Oral Daily   Continuous Infusions:   . DISCONTD: sodium chloride 50 mL/hr at 01/10/12 1240     Assessment/Plan: 1. Febrile illness/acute bronchitis: Remarkable improvement. Change to oral antibiotics. Would complete total 5 days. Change to oral steroids. Complete a short course in the outpatient setting. 2. Nausea, vomiting, diarrhea: Resolved. 3. Hypertension: Stable. 4. End-stage renal disease: Per nephrology 5. Smoker: I encouraged her to quit.  Code Status: Full code   Disposition Plan: Probable discharge January 16 after hemodialysis.   Brendia Sacks, MD  [facility] Pager [phone] 01/11/2012, 1:09  PM    LOS: 1 day

## 2012-01-11 NOTE — Progress Notes (Signed)
  Subjective: Feeling much better today.  Patient has occluded graft and needs dialysis.  Objective: Vital signs in last 24 hours: Temp:  [97.8 F (36.6 C)-98.6 F (37 C)] 98.1 F (36.7 C) (01/15 1400) Pulse Rate:  [58-77] 58  (01/15 1400) Resp:  [20] 20  (01/15 1400) BP: (128-152)/(8-95) 151/8 mmHg (01/15 1400) SpO2:  [93 %-98 %] 93 % (01/15 1400) Weight:  [181 lb 10.5 oz (82.4 kg)] 181 lb 10.5 oz (82.4 kg) (01/14 2100) Last BM Date: 01/10/12  Intake/Output from previous day: 01/14 0701 - 01/15 0700 In: 720 [P.O.:720] Out: 1 [Stool:1] Intake/Output this shift:    Physical Exam:  Lungs:  Wheezes bilateral.  Heart: RRR;  Extremity:  Left arm graft occluded.  Palpable left radial pulse.    Lab Results:   Basename 01/11/12 1511 01/10/12 1120  WBC 10.8* 10.2  HGB 12.3 11.9*  HCT 36.0 35.5*  PLT 201 163   BMET  Basename 01/10/12 1120 01/10/12 0719  NA -- 136  K -- 3.5  CL -- 96  CO2 -- 23  GLUCOSE -- 94  BUN -- 30*  CREATININE 9.20* 8.86*  CALCIUM -- 8.8   PT/INR No results found for this basename: LABPROT:2,INR:2 in the last 72 hours ABG No results found for this basename: PHART:2,PCO2:2,PO2:2,HCO3:2 in the last 72 hours  Studies/Results: Dg Chest 2 View  01/10/2012  *RADIOLOGY REPORT*  Clinical Data: Cough, shortness of breath.  CHEST - 2 VIEW  Comparison: 08/23/2011  Findings: Right dialysis catheter has been removed. Heart and mediastinal contours are within normal limits.  No focal opacities or effusions.  No acute bony abnormality.  Mild peribronchial thickening.  IMPRESSION: Bronchitic changes.  Original Report Authenticated By: Cyndie Chime, M.D.    Anti-infectives: Anti-infectives     Start     Dose/Rate Route Frequency Ordered Stop   01/12/12 0000   levofloxacin (LEVAQUIN) IVPB 500 mg        500 mg 100 mL/hr over 60 Minutes Intravenous Every 48 hours 01/10/12 1129 01/17/12 2359   01/10/12 1200   Levofloxacin (LEVAQUIN) IVPB 750 mg        750  mg 100 mL/hr over 90 Minutes Intravenous  Once 01/10/12 1129 01/10/12 1409   01/10/12 1100   levofloxacin (LEVAQUIN) IVPB 500 mg  Status:  Discontinued        500 mg 100 mL/hr over 60 Minutes Intravenous Daily 01/10/12 1038 01/10/12 1127   01/10/12 0845   oseltamivir (TAMIFLU) capsule 75 mg        75 mg Oral [facility] 01/10/12 0833 01/10/12 0852          Assessment/Plan: Patient is feeling much better.  Renal service feels that patient is OK for declot.  Plan for declot and dialysis today.   Sondi Desch RYAN 01/11/2012

## 2012-01-11 NOTE — Progress Notes (Signed)
Pt. Call the nurse because she was bleeding from the LUA were her fistula was declotted in IR.The side was clean, and 4x4 were place on, ABD pad and somall Kerlix bandage.Keep monitoring pt. Closely.

## 2012-01-11 NOTE — Progress Notes (Signed)
Subjective:  Feeling better; no further N/V/D willing to try to give up cig  Vital signs in last 24 hours: Filed Vitals:   01/10/12 1400 01/10/12 1800 01/10/12 2100 01/11/12 0500  BP: 112/55 130/89 144/93 152/95  Pulse: 112 65 66 65  Temp: 98 F (36.7 C) 98.6 F (37 C) 97.9 F (36.6 C) 97.8 F (36.6 C)  TempSrc: Oral Oral Oral Oral  Resp: 20 20 20 20   Height:      Weight:   82.4 kg (181 lb 10.5 oz)   SpO2: 97% 98% 93% 93%   Weight change:   Intake/Output Summary (Last 24 hours) at 01/11/12 0915 Last data filed at 01/10/12 1900  Gross per 24 hour  Intake    720 ml  Output      1 ml  Net    719 ml   Labs: Basic Metabolic Panel:  Lab 01/10/12 0865 01/10/12 0719 01/07/12 1442  NA -- 136 137  K -- 3.5 4.2  CL -- 96 --  CO2 -- 23 --  GLUCOSE -- 94 84  BUN -- 30* --  CREATININE 9.20* 8.86* --  CALCIUM -- 8.8 --  ALB -- -- --  PHOS -- -- --   Liver Function Tests:  Lab 01/10/12 0719  AST 36  ALT 20  ALKPHOS 153*  BILITOT 0.2*  PROT 6.8  ALBUMIN 2.7*   No results found for this basename: LIPASE:3,AMYLASE:3 in the last 168 hours No results found for this basename: AMMONIA:3 in the last 168 hours CBC:  Lab 01/10/12 1120 01/10/12 0719 01/07/12 1442  WBC 10.2 11.9* --  NEUTROABS -- 9.3* --  HGB 11.9* 13.1 13.6  HCT 35.5* 38.3 40.0  MCV 90.8 90.3 --  PLT 163 174 --   Cardiac Enzymes: No results found for this basename: CKTOTAL:5,CKMB:5,CKMBINDEX:5,TROPONINI:5 in the last 168 hours CBG: No results found for this basename: GLUCAP:5 in the last 168 hours  Iron Studies: No results found for this basename: IRON,TIBC,TRANSFERRIN,FERRITIN in the last 72 hours Studies/Results: Dg Chest 2 View  01/10/2012  *RADIOLOGY REPORT*  Clinical Data: Cough, shortness of breath.  CHEST - 2 VIEW  Comparison: 08/23/2011  Findings: Right dialysis catheter has been removed. Heart and mediastinal contours are within normal limits.  No focal opacities or effusions.  No acute bony  abnormality.  Mild peribronchial thickening.  IMPRESSION: Bronchitic changes.  Original Report Authenticated By: Cyndie Chime, M.D.   Medications:    . DISCONTD: sodium chloride 50 mL/hr at 01/10/12 1240      . aspirin EC  81 mg Oral Daily  . atenolol  50 mg Oral QHS  . gabapentin  100 mg Oral TID  . heparin  5,000 Units Subcutaneous Q8H  . levofloxacin (LEVAQUIN) IV  500 mg Intravenous Q48H  . levofloxacin (LEVAQUIN) IV  750 mg Intravenous Once  . methylPREDNISolone (SOLU-MEDROL) injection  40 mg Intravenous Q6H  . multivitamin  1 tablet Oral QHS  . pantoprazole  40 mg Oral Q1200  . DISCONTD: aspirin  81 mg Oral Daily  . DISCONTD: atenolol  50 mg Oral Daily  . DISCONTD: levofloxacin (LEVAQUIN) IV  500 mg Intravenous Daily  . DISCONTD: ranolazine  500 mg Oral Daily    I  have reviewed scheduled and prn medications.  General: comfortable  Heart: RRR, Soft 2/6 SEM  Lungs: Rhonchi, wheezes scattered bilat R>L Abdomen: soft, slight epigastric tenderness/ND, +BS  Extremities: no ankle edema  Skin: intact  Neuro: alert, Ox3, pleasant affect  Dialysis Access: clotted LUA AVG; neg T/B   Assessment/Plan:  1. FUO- mildly elevated WBC 11.9; rapid FLU negative; Bld Cx pend; CXR evidence bronchitis (mild peribronchial thickenuing); no evidence Pneumonuia- on IV solumedrol, atrovent, provental nebs; and IV Levaquin; empiric Tamiflu . Most likely viral 2. ESRD - MWF [place]; HD today after declot 3. Vascular Access- has clotted AVG; no signs graft infection; scheduled for declot today  4. Hypokalemia- K 3.5; chronic low on 3K bath outpt setting; N/V/D resolved; cont same and follow  4. Hypertension/volume - controlled with meds (Micardis) and UF on HD; discontinue IVF; 1Liter goal with HD today 5. Anemia - Hgb 11.9; prev 13.0 on 01/05/12; no ESA or iron outpt setting; follow closely  6. Metabolic bone disease - Sensipar, Fosrenol, Tums Ultra outpt setting; recent ^ iPTH 719.4 on 11/24/11;  follow closely  7. DM/HLD- (history steroid induced DM) CBG and SSI; most recent Hgb A1c 5.3% on 10/20/11 Pravastatin qhs  8. Insomnia- Trazadone qhs outpt setting  9. Nutrition - albumin 2.7, appetite poor,NPO for declot 10. Disposition- per primary services  Samuel Germany, FNP-C [facility] Pager [phone]  01/11/2012,9:15 AM  LOS: 1 day I have seen and examined this patient and agree with the plan of care   Enid Maultsby L 01/11/2012, 9:43 AM

## 2012-01-11 NOTE — ED Notes (Signed)
Heparin 2000units given IV per Dr. Lowella Dandy

## 2012-01-11 NOTE — Procedures (Signed)
Successful declot of left upper arm graft.  No immediate complication.

## 2012-01-12 DIAGNOSIS — J209 Acute bronchitis, unspecified: Principal | ICD-10-CM | POA: Diagnosis present

## 2012-01-12 DIAGNOSIS — J988 Other specified respiratory disorders: Secondary | ICD-10-CM | POA: Diagnosis present

## 2012-01-12 DIAGNOSIS — B9789 Other viral agents as the cause of diseases classified elsewhere: Secondary | ICD-10-CM | POA: Diagnosis present

## 2012-01-12 DIAGNOSIS — Z992 Dependence on renal dialysis: Secondary | ICD-10-CM | POA: Diagnosis present

## 2012-01-12 MED ORDER — LEVOFLOXACIN 500 MG PO TABS: 500.0000 mg | ORAL_TABLET | ORAL | Status: AC

## 2012-01-12 MED ORDER — ALBUTEROL 90 MCG/ACT IN AERS: 2.0000 | INHALATION_SPRAY | Freq: Four times a day (QID) | RESPIRATORY_TRACT | Status: DC | PRN

## 2012-01-12 MED ORDER — LANTHANUM CARBONATE 500 MG PO CHEW
1000.0000 mg | CHEWABLE_TABLET | Freq: Three times a day (TID) | ORAL | Status: DC
  Administered 2012-01-12 (×2): 1000 mg via ORAL
  Filled 2012-01-12 (×3): qty 2

## 2012-01-12 MED ORDER — RENA-VITE PO TABS: 1.0000 | ORAL_TABLET | Freq: Every day | ORAL | Status: DC

## 2012-01-12 MED ORDER — PREDNISONE 20 MG PO TABS: 40.0000 mg | ORAL_TABLET | Freq: Every day | ORAL | Status: AC

## 2012-01-12 MED ORDER — CINACALCET HCL 30 MG PO TABS
30.0000 mg | ORAL_TABLET | Freq: Every day | ORAL | Status: DC
  Administered 2012-01-12: 30 mg via ORAL
  Filled 2012-01-12: qty 1

## 2012-01-12 MED FILL — Heparin Sodium (Porcine) Inj 1000 Unit/ML: INTRAMUSCULAR | Qty: 1 | Status: AC

## 2012-01-12 NOTE — Discharge Summary (Signed)
Patient ID: Kim Hodge MRN: [id number] DOB/AGE: 1956/03/30 56 y.o.  Admit date: 01/10/2012 Discharge date: 01/12/2012  Primary Care Physician:  Oliver Pila, MD, MD  Discharge Diagnoses:   Principal Problem:  *Viral respiratory illness   Acute Bronchitis  Active Problems:  End stage renal disease on dialysis  Hypertension  viral gastroenteritis   Current Discharge Medication List    START taking these medications   Details  levofloxacin (LEVAQUIN) 500 MG tablet Take 1 tablet (500 mg total) by mouth every other day. Qty: 3 tablet, Refills: 0    multivitamin (RENA-VIT) TABS tablet Take 1 tablet by mouth at bedtime. Qty: 1 tablet, Refills: 0    predniSONE (DELTASONE) 20 MG tablet Take 2 tablets (40 mg total) by mouth daily. Qty: 8 tablet, Refills: 0      CONTINUE these medications which have NOT CHANGED   Details  aspirin 81 MG EC tablet Take 81 mg by mouth daily.      cinacalcet (SENSIPAR) 30 MG tablet Take 30 mg by mouth daily.      docusate sodium (COLACE) 100 MG capsule Take 100 mg by mouth daily as needed. For stool softening    folic acid-vitamin b complex-vitamin c-selenium-zinc (DIALYVITE) 3 MG TABS Take 1 tablet by mouth daily.      gabapentin (NEURONTIN) 100 MG capsule Take 100 mg by mouth 3 (three) times daily as needed. For pain    HYDROcodone-acetaminophen (NORCO) 5-325 MG per tablet Take 1 tablet by mouth every 4 (four) hours as needed. For pain    lanthanum (FOSRENOL) 1000 MG chewable tablet Chew 1,000 mg by mouth 3 (three) times daily with meals.    lisinopril (PRINIVIL,ZESTRIL) 40 MG tablet Take 20 mg by mouth daily.    metolazone (ZAROXOLYN) 5 MG tablet Take 5 mg by mouth daily.      phosphorus (K PHOS NEUTRAL) 155-852-130 MG tablet Take 2 tablets by mouth 3 (three) times daily with meals.    pravastatin (PRAVACHOL) 20 MG tablet Take 20 mg by mouth daily.      promethazine (PHENERGAN) 25 MG tablet Take 25 mg by mouth every 4 (four)  hours as needed. For nausea      STOP taking these medications     atenolol (TENORMIN) 50 MG tablet      Calcium Acetate 667 MG TABS      dexlansoprazole (DEXILANT) 60 MG capsule      ferrous sulfate 325 (65 FE) MG tablet      ranolazine (RANEXA) 500 MG 12 hr tablet         Disposition and Follow-up:  Follow up with PCP in 1 week  follow up with scheduled HD. Left AV graft sutures to be removed during HD on Friday.  Consults:  Renal  Significant Diagnostic Studies:  Dg Chest 2 View  01/10/2012  *RADIOLOGY REPORT*  Clinical Data: Cough, shortness of breath.  CHEST - 2 VIEW  Comparison: 08/23/2011  Findings: Right dialysis catheter has been removed. Heart and mediastinal contours are within normal limits.  No focal opacities or effusions.  No acute bony abnormality.  Mild peribronchial thickening.  IMPRESSION: Bronchitic changes.  Original Report Authenticated By: Cyndie Chime, M.D.    Brief H and P: For complete details please refer to admission H and P, but in brief 56 year old woman presented to the emergency department with flulike symptoms for one week. Nausea, vomiting, diarrhea, fever, chills, congestion, productive cough. Noted to be febrile in the emergency department with a temperature  of 101.1. Tachycardic and tachypneic. She was initially breathing 30 times a minute and quite tachypneic. This is subsequently improved with treatment. She was felt to be stable for addition to the medical floor.  She reports poor oral intake secondary to vomiting. She has tried over-the-counter medications without relief. No antibiotics.  Seen January 11 for declot procedure. However was febrile and noted to have flu symptoms for 3 days. Therefore temporary dialysis catheter was placed and declot procedure deferred. Dialysis catheter was subsequently removed.      Physical Exam on Discharge:  Filed Vitals:   01/12/12 0108 01/12/12 0530 01/12/12 0955 01/12/12 1000  BP: 137/80 130/78   129/80  Pulse: 74 68  63  Temp: 97.9 F (36.6 C) 98 F (36.7 C)  98 F (36.7 C)  TempSrc: Oral Oral  Oral  Resp: 22 22  20   Height: 5\' 3"  (1.6 m)     Weight:      SpO2: 95% 95% 95% 96%     Intake/Output Summary (Last 24 hours) at 01/12/12 1504 Last data filed at 01/12/12 0900  Gross per 24 hour  Intake    840 ml  Output   1006 ml  Net   -166 ml    General: middle aged female  in no acute distress. HEENT: No bruits, no goiter. Heart: Regular rate and rhythm, without murmurs, rubs, gallops. Lungs: Clear to auscultation bilaterally. Abdomen: Soft, nontender, nondistended, positive bowel sounds. Extremities: No clubbing cyanosis or edema with positive pedal pulses. Left AV graft site appears clean with sutures intact Neuro: Grossly intact, nonfocal.  CBC:    Component Value Date/Time   WBC 10.8* 01/11/2012 1511   HGB 12.3 01/11/2012 1511   HCT 36.0 01/11/2012 1511   PLT 201 01/11/2012 1511   MCV 90.2 01/11/2012 1511   NEUTROABS 9.0* 01/11/2012 1511   LYMPHSABS 1.3 01/11/2012 1511   MONOABS 0.4 01/11/2012 1511   EOSABS 0.0 01/11/2012 1511   BASOSABS 0.1 01/11/2012 1511    Basic Metabolic Panel:    Component Value Date/Time   NA 140 01/11/2012 1511   K 4.4 01/11/2012 1511   CL 96 01/11/2012 1511   CO2 23 01/11/2012 1511   BUN 59* 01/11/2012 1511   CREATININE 10.79* 01/11/2012 1511   GLUCOSE 107* 01/11/2012 1511   CALCIUM 8.6 01/11/2012 1511   CALCIUM 6.7* 12/22/2009 0400    Hospital Course:   Acute bronchitis with possible viral URI Patient admitted to medical floor with acute febrile illness. CXR on admission negative for Pneumonia. PCR for influenza negative.she was empirically started on levofloxacin, IV solumedrol and nebs. She also had nausea , vomiting and abdominal pain symptoms have now resolved and likely due to viral symptoms as well. She now remains afebrile and her URI symptoms have markedly improved. She will be discharged on a short course of po Levaquin and po  prednisone for 4 more days.  ESRD on HD  she had declotting of her AV graft done (which was planned for outpt)  followed by HD while in the hospital. Stitches will be removed during next HD.   HTN Stable  Time spent on Discharge: 45 minutes   Signed: Eddie North 01/12/2012, 3:04 PM

## 2012-01-12 NOTE — Progress Notes (Signed)
Pt discharged to home provided discharge instructions and prescriptions along with handouts. Pt verbalized understanding of discharge information. Pt stable. Pt transported by tech IV removed and documented. Virjean Boman Howell, RN    

## 2012-01-12 NOTE — Progress Notes (Signed)
Subjective:  Wheezing with occasional coughing spells; some hot flashes vs night sweats; completed HD late last night; no desire for cigarettes; think home by Friday    Vital signs in last 24 hours: Filed Vitals:   01/12/12 0000 01/12/12 0013 01/12/12 0108 01/12/12 0530  BP: 131/79 126/77 137/80 130/78  Pulse: 77 74 74 68  Temp:  97.5 F (36.4 C) 97.9 F (36.6 C) 98 F (36.7 C)  TempSrc:  Oral Oral Oral  Resp: 26 26 22 22   Height:   5\' 3"  (1.6 m)   Weight:  83.6 kg (184 lb 4.9 oz)    SpO2:  91% 95% 95%   Weight change: 1.9 kg (4 lb 3 oz)  Intake/Output Summary (Last 24 hours) at 01/12/12 0916 Last data filed at 01/12/12 0107  Gross per 24 hour  Intake    600 ml  Output   1006 ml  Net   -406 ml   Labs: Basic Metabolic Panel:  Lab 01/11/12 1610 01/10/12 1120 01/10/12 0719 01/07/12 1442  NA 140 -- 136 137  K 4.4 -- 3.5 4.2  CL 96 -- 96 --  CO2 23 -- 23 --  GLUCOSE 107* -- 94 84  BUN 59* -- 30* --  CREATININE 10.79* 9.20* 8.86* --  CALCIUM 8.6 -- 8.8 --  ALB -- -- -- --  PHOS 7.1* -- -- --   Liver Function Tests:  Lab 01/11/12 1511 01/10/12 0719  AST -- 36  ALT -- 20  ALKPHOS -- 153*  BILITOT -- 0.2*  PROT -- 6.8  ALBUMIN 2.6* 2.7*   No results found for this basename: LIPASE:3,AMYLASE:3 in the last 168 hours No results found for this basename: AMMONIA:3 in the last 168 hours CBC:  Lab 01/11/12 1511 01/10/12 1120 01/10/12 0719  WBC 10.8* 10.2 11.9*  NEUTROABS 9.0* -- 9.3*  HGB 12.3 11.9* 13.1  HCT 36.0 35.5* 38.3  MCV 90.2 90.8 90.3  PLT 201 163 174   Cardiac Enzymes: No results found for this basename: CKTOTAL:5,CKMB:5,CKMBINDEX:5,TROPONINI:5 in the last 168 hours CBG: No results found for this basename: GLUCAP:5 in the last 168 hours  Iron Studies: No results found for this basename: IRON,TIBC,TRANSFERRIN,FERRITIN in the last 72 hours Studies/Results: No results found. Medications:      . alteplase  2 mg Intracatheter Once  . aspirin EC  81 mg  Oral Daily  . atenolol  50 mg Oral QHS  . gabapentin  100 mg Oral TID  . heparin      . heparin  2,000 Units Intravenous Once  . heparin  5,000 Units Subcutaneous Q8H  . levofloxacin  500 mg Oral Q48H  . multivitamin  1 tablet Oral QHS  . pantoprazole  40 mg Oral Q1200  . paricalcitol  5 mcg Intravenous Once in dialysis  . predniSONE  20 mg Oral Q breakfast  . DISCONTD: levofloxacin (LEVAQUIN) IV  500 mg Intravenous Q48H  . DISCONTD: levofloxacin  500 mg Oral Q48H  . DISCONTD: methylPREDNISolone (SOLU-MEDROL) injection  40 mg Intravenous Q6H    I  have reviewed scheduled and prn medications.  General: comfortable  Heart: RRR, Soft 2/6 SEM  Lungs: Rhonchi, wheezes scattered bilat R>L  Abdomen: soft, slight epigastric tenderness/ND, +BS  Extremities: no ankle edema  Skin: intact  Neuro: alert, Ox3, very pleasant affect  Dialysis Access: clotted LUA AVG; +T/B   Assessment/Plan:  1. FUO- mildly elevated WBC 11.9; rapid FLU negative; Bld Cx pend; 2. Bronchitis- mild peribronchial thickening on CXR;  now on po Levaquin; IV solumedrol now discontinued and on Prednisone 20mg  daily; needs tapering prior to discharge; Willing to quit smoking and denies need Nicotine patches (smoking 5 cig/day); suspect bronchospastic cough; only 1 neb treatment since adm; treatment now and recommend prn inhaler at time of discharge  2. ESRD - MWF [place]; s/p declot IR yesterday and had HD late last pm; plans for HD Friday to resume outpt regimen  3. Vascular Access- s/p declot; some bleeding post procedure; needs suture removal; RN informed; follow closely  4. Hypokalemia- resolved; K level now 4.3; however chronic and on 3K bath in outpt setting which ongoing needs to be re-evaluated 4. Hypertension/volume - controlled with current meds and UF on HD  5. Anemia - Hgb 12.3; prev 13.0 on 01/05/12; no ESA or iron outpt setting; follow closely  6. Metabolic bone disease - Phos 7.1; resume outpt dose Sensipar and  Fosrenol for now; follow trend  7. DM/HLD- (history steroid induced DM) recommend CBG and SSI; most recent Hgb A1c 5.3% on 10/20/11 Pravastatin qhs  8. Insomnia- Trazadone qhs outpt setting ; has prn ambien 9. Nutrition - albumin 2.6, appetite poor, was NPO yesterday; now eating  10. Disposition- suspect soon; per primary services  Kim Germany, FNP-C [facility] Pager [phone]  01/12/2012,9:16 AM  LOS: 2 days   I have seen and examined this patient and agree with plan .  Sitting up eating lunch.  O2 off and does not look SOB. O2 sat 98% on RA.    Not sure why she couldn't go home in am.  Will need the sutures removed in AM Lera Gaines T,MD 01/12/2012 12:51 PM

## 2012-01-19 ENCOUNTER — Ambulatory Visit (HOSPITAL_COMMUNITY)
Admission: RE | Admit: 2012-01-19 | Discharge: 2012-01-19 | Disposition: A | Discharge status: Home / Self Care | Payer: 59 | Source: Ambulatory Visit | Attending: Nephrology | Admitting: Nephrology

## 2012-01-19 ENCOUNTER — Other Ambulatory Visit (HOSPITAL_COMMUNITY): Payer: Self-pay | Admitting: Nephrology

## 2012-01-19 DIAGNOSIS — N186 End stage renal disease: Secondary | ICD-10-CM

## 2012-01-19 DIAGNOSIS — E785 Hyperlipidemia, unspecified: Secondary | ICD-10-CM | POA: Insufficient documentation

## 2012-01-19 DIAGNOSIS — E669 Obesity, unspecified: Secondary | ICD-10-CM | POA: Insufficient documentation

## 2012-01-19 DIAGNOSIS — I120 Hypertensive chronic kidney disease with stage 5 chronic kidney disease or end stage renal disease: Secondary | ICD-10-CM | POA: Insufficient documentation

## 2012-01-19 DIAGNOSIS — E119 Type 2 diabetes mellitus without complications: Secondary | ICD-10-CM | POA: Insufficient documentation

## 2012-01-19 DIAGNOSIS — T82898A Other specified complication of vascular prosthetic devices, implants and grafts, initial encounter: Secondary | ICD-10-CM | POA: Insufficient documentation

## 2012-01-19 DIAGNOSIS — Z992 Dependence on renal dialysis: Secondary | ICD-10-CM | POA: Insufficient documentation

## 2012-01-19 DIAGNOSIS — Y849 Medical procedure, unspecified as the cause of abnormal reaction of the patient, or of later complication, without mention of misadventure at the time of the procedure: Secondary | ICD-10-CM | POA: Insufficient documentation

## 2012-01-19 MED ORDER — HEPARIN SODIUM (PORCINE) 1000 UNIT/ML IJ SOLN
INTRAMUSCULAR | Status: AC | PRN
  Administered 2012-01-19: 3000 [IU] via INTRAVENOUS

## 2012-01-19 MED ORDER — SODIUM CHLORIDE 0.9 % IV SOLN
INTRAVENOUS | Status: AC | PRN
  Administered 2012-01-19: 50 mL/h via INTRAVENOUS

## 2012-01-19 MED ORDER — IOHEXOL 300 MG/ML  SOLN
100.0000 mL | Freq: Once | INTRAMUSCULAR | Status: AC | PRN
  Administered 2012-01-19: 40 mL via INTRAVENOUS

## 2012-01-19 MED ORDER — MIDAZOLAM HCL 5 MG/5ML IJ SOLN
INTRAMUSCULAR | Status: AC | PRN
  Administered 2012-01-19: 0.5 mg via INTRAVENOUS
  Administered 2012-01-19: 1 mg via INTRAVENOUS

## 2012-01-19 MED ORDER — HEPARIN SODIUM (PORCINE) 1000 UNIT/ML IJ SOLN
INTRAMUSCULAR | Status: AC
  Filled 2012-01-19: qty 1

## 2012-01-19 MED ORDER — ALTEPLASE 100 MG IV SOLR
2.0000 mg | INTRAVENOUS | Status: DC
  Filled 2012-01-19: qty 2

## 2012-01-19 MED ORDER — MIDAZOLAM HCL 2 MG/2ML IJ SOLN
INTRAMUSCULAR | Status: AC
  Filled 2012-01-19: qty 4

## 2012-01-19 MED ORDER — FENTANYL CITRATE 0.05 MG/ML IJ SOLN
INTRAMUSCULAR | Status: AC
  Filled 2012-01-19: qty 4

## 2012-01-19 MED ORDER — FENTANYL CITRATE 0.05 MG/ML IJ SOLN
INTRAMUSCULAR | Status: AC | PRN
  Administered 2012-01-19: 25 ug via INTRAVENOUS
  Administered 2012-01-19: 50 ug via INTRAVENOUS

## 2012-01-19 MED ORDER — ALTEPLASE 100 MG IV SOLR
INTRAVENOUS | Status: AC | PRN
  Administered 2012-01-19: 2 mg

## 2012-01-19 NOTE — Procedures (Addendum)
Declot LEFT arm graft 7mm PTA venous anast No complication No blood loss. See complete dictation in [facility]

## 2012-01-19 NOTE — ED Notes (Signed)
O2 discontinued.

## 2012-01-19 NOTE — ED Notes (Signed)
In nurses station. Awake and oriented. No pain. VS stable. Dressing to Left arm has scant sanguinous under tegaderm from previous puncture site (?from dialysis attempt this am).

## 2012-01-19 NOTE — ED Notes (Signed)
Unable to reach her ride, sister Patsy. Spoke with her son who does not drive and he will call another family member to come pick her up.

## 2012-01-19 NOTE — ED Notes (Signed)
Report given to Carissa RN

## 2012-01-19 NOTE — ED Notes (Signed)
1630, pt taken to front entrance and placed in her cousins vehicle.  Pt denies having any pain at this time.

## 2012-01-19 NOTE — ED Notes (Addendum)
Discharge instructions re declot and sedation reviewed and teaching sheets given. Waiting for her ride (she was able to contact a ride). Strong bruit heard over left arm graft. Tolerated fluids and [place] sandwich.

## 2012-01-19 NOTE — H&P (Signed)
Kim Hodge is an 56 y.o. female.   Chief Complaint: clotted dialysis graft HPI: Patient with ESRD and clotted left arm AVGG presents today for thrombolysis, possible angioplasty/stenting of graft or placement of new dialysis catheter if necessary.  Past Medical History  Diagnosis Date  . Hypertension   . Hyperlipidemia   . Obesity   . Chronic kidney disease   . Fibrosis of uterus   . Membranoproliferative glomerulonephritis     type 1  . Diabetes mellitus     reports resolved  . Anemia   . Chronic bronchitis   . Degenerative joint disease   . Hyperparathyroidism, secondary renal   . ESRD (end stage renal disease)     Past Surgical History  Procedure Date  . Av fistula placement 01/02/10  . Abdominal hysterectomy   . Knee arthroscopy right knee 2006    Family History  Problem Relation Age of Onset  . Cancer Mother     breast  . Stroke Father    Social History:  reports that she has been smoking Cigarettes.  She has a 10 pack-year smoking history. She does not have any smokeless tobacco history on file. She reports that she uses illicit drugs (Marijuana). She reports that she does not drink alcohol.  Allergies:  Allergies  Allergen Reactions  . Ace Inhibitors     REACTION: angioedema    Medications Prior to Admission  Medication Sig Dispense Refill  . aspirin 81 MG EC tablet Take 81 mg by mouth daily.        . cinacalcet (SENSIPAR) 30 MG tablet Take 30 mg by mouth daily.        Marland Kitchen docusate sodium (COLACE) 100 MG capsule Take 100 mg by mouth daily as needed. For stool softening      . folic acid-vitamin b complex-vitamin c-selenium-zinc (DIALYVITE) 3 MG TABS Take 1 tablet by mouth daily.        Marland Kitchen gabapentin (NEURONTIN) 100 MG capsule Take 100 mg by mouth 3 (three) times daily as needed. For pain      . lanthanum (FOSRENOL) 1000 MG chewable tablet Chew 1,000 mg by mouth 3 (three) times daily with meals.      Marland Kitchen lisinopril (PRINIVIL,ZESTRIL) 40 MG tablet Take 20 mg by  mouth daily.      . multivitamin (RENA-VIT) TABS tablet Take 1 tablet by mouth at bedtime.  1 tablet  0  . pravastatin (PRAVACHOL) 20 MG tablet Take 20 mg by mouth daily.        . promethazine (PHENERGAN) 25 MG tablet Take 25 mg by mouth every 4 (four) hours as needed. For nausea      . albuterol (PROVENTIL,VENTOLIN) 90 MCG/ACT inhaler Inhale 2 puffs into the lungs every 6 (six) hours as needed for wheezing or shortness of breath.  17 g  2  . HYDROcodone-acetaminophen (NORCO) 5-325 MG per tablet Take 1 tablet by mouth every 4 (four) hours as needed. For pain      . levofloxacin (LEVAQUIN) 500 MG tablet Take 1 tablet (500 mg total) by mouth every other day.  3 tablet  0  . metolazone (ZAROXOLYN) 5 MG tablet Take 5 mg by mouth daily.        . phosphorus (K PHOS NEUTRAL) 155-852-130 MG tablet Take 2 tablets by mouth 3 (three) times daily with meals.      . predniSONE (DELTASONE) 20 MG tablet Take 2 tablets (40 mg total) by mouth daily.  8 tablet  0  Medications Prior to Admission  Medication Dose Route Frequency Provider Last Rate Last Dose  . alteplase (ACTIVASE) injection 2 mg  2 mg Intracatheter to XRAY Dayne Oley Balm III, MD      . fentaNYL (SUBLIMAZE) 0.05 MG/ML injection           . heparin 1000 UNIT/ML injection           . midazolam (VERSED) 2 MG/2ML injection            Results for orders placed during the hospital encounter of 01/10/12  CBC      Component Value Range   WBC 11.9 (*) 4.0 - 10.5 (K/uL)   RBC 4.24  3.87 - 5.11 (MIL/uL)   Hemoglobin 13.1  12.0 - 15.0 (g/dL)   HCT 16.1  09.6 - 04.5 (%)   MCV 90.3  78.0 - 100.0 (fL)   MCH 30.9  26.0 - 34.0 (pg)   MCHC 34.2  30.0 - 36.0 (g/dL)   RDW 40.9  81.1 - 91.4 (%)   Platelets 174  150 - 400 (K/uL)  DIFFERENTIAL      Component Value Range   Neutrophils Relative 78 (*) 43 - 77 (%)   Neutro Abs 9.3 (*) 1.7 - 7.7 (K/uL)   Lymphocytes Relative 13  12 - 46 (%)   Lymphs Abs 1.6  0.7 - 4.0 (K/uL)   Monocytes Relative 8  3 -  12 (%)   Monocytes Absolute 0.9  0.1 - 1.0 (K/uL)   Eosinophils Relative 0  0 - 5 (%)   Eosinophils Absolute 0.0  0.0 - 0.7 (K/uL)   Basophils Relative 0  0 - 1 (%)   Basophils Absolute 0.0  0.0 - 0.1 (K/uL)  COMPREHENSIVE METABOLIC PANEL      Component Value Range   Sodium 136  135 - 145 (mEq/L)   Potassium 3.5  3.5 - 5.1 (mEq/L)   Chloride 96  96 - 112 (mEq/L)   CO2 23  19 - 32 (mEq/L)   Glucose, Bld 94  70 - 99 (mg/dL)   BUN 30 (*) 6 - 23 (mg/dL)   Creatinine, Ser 7.82 (*) 0.50 - 1.10 (mg/dL)   Calcium 8.8  8.4 - 95.6 (mg/dL)   Total Protein 6.8  6.0 - 8.3 (g/dL)   Albumin 2.7 (*) 3.5 - 5.2 (g/dL)   AST 36  0 - 37 (U/L)   ALT 20  0 - 35 (U/L)   Alkaline Phosphatase 153 (*) 39 - 117 (U/L)   Total Bilirubin 0.2 (*) 0.3 - 1.2 (mg/dL)   GFR calc non Af Amer 4 (*) >90 (mL/min)   GFR calc Af Amer 5 (*) >90 (mL/min)  INFLUENZA PANEL BY PCR      Component Value Range   Influenza A By PCR NEGATIVE  NEGATIVE    Influenza B By PCR NEGATIVE  NEGATIVE    H1N1 flu by pcr NOT DETECTED  NOT DETECTED   CULTURE, BLOOD (ROUTINE X 2)      Component Value Range   Specimen Description BLOOD RIGHT HAND     Special Requests BOTTLES DRAWN AEROBIC AND ANAEROBIC 10CC     Setup Time [id number]     Culture NO GROWTH 5 DAYS     Report Status 01/16/2012 FINAL    CULTURE, BLOOD (ROUTINE X 2)      Component Value Range   Specimen Description BLOOD RIGHT ANTECUBITAL     Special Requests BOTTLES DRAWN AEROBIC AND ANAEROBIC 10C  Setup Time [id number]     Culture NO GROWTH 5 DAYS     Report Status 01/16/2012 FINAL    CULTURE, SPUTUM-ASSESSMENT      Component Value Range   Specimen Description SPUTUM     Special Requests NONE     Sputum evaluation       Value: THIS SPECIMEN IS ACCEPTABLE. RESPIRATORY CULTURE REPORT TO FOLLOW.   Report Status 01/10/2012 FINAL    CBC      Component Value Range   WBC 10.2  4.0 - 10.5 (K/uL)   RBC 3.91  3.87 - 5.11 (MIL/uL)   Hemoglobin 11.9 (*) 12.0 - 15.0  (g/dL)   HCT 81.1 (*) 91.4 - 46.0 (%)   MCV 90.8  78.0 - 100.0 (fL)   MCH 30.4  26.0 - 34.0 (pg)   MCHC 33.5  30.0 - 36.0 (g/dL)   RDW 78.2  95.6 - 21.3 (%)   Platelets 163  150 - 400 (K/uL)  CREATININE, SERUM      Component Value Range   Creatinine, Ser 9.20 (*) 0.50 - 1.10 (mg/dL)   GFR calc non Af Amer 4 (*) >90 (mL/min)   GFR calc Af Amer 5 (*) >90 (mL/min)  RENAL FUNCTION PANEL      Component Value Range   Sodium 140  135 - 145 (mEq/L)   Potassium 4.4  3.5 - 5.1 (mEq/L)   Chloride 96  96 - 112 (mEq/L)   CO2 23  19 - 32 (mEq/L)   Glucose, Bld 107 (*) 70 - 99 (mg/dL)   BUN 59 (*) 6 - 23 (mg/dL)   Creatinine, Ser 08.65 (*) 0.50 - 1.10 (mg/dL)   Calcium 8.6  8.4 - 78.4 (mg/dL)   Phosphorus 7.1 (*) 2.3 - 4.6 (mg/dL)   Albumin 2.6 (*) 3.5 - 5.2 (g/dL)   GFR calc non Af Amer 3 (*) >90 (mL/min)   GFR calc Af Amer 4 (*) >90 (mL/min)  CULTURE, RESPIRATORY      Component Value Range   Specimen Description SPUTUM     Special Requests NONE     Gram Stain       Value: FEW WBC PRESENT, PREDOMINANTLY PMN     FEW SQUAMOUS EPITHELIAL CELLS PRESENT     FEW GRAM POSITIVE COCCI     IN PAIRS   Culture NORMAL OROPHARYNGEAL FLORA     Report Status 01/12/2012 FINAL    CBC      Component Value Range   WBC 10.8 (*) 4.0 - 10.5 (K/uL)   RBC 3.99  3.87 - 5.11 (MIL/uL)   Hemoglobin 12.3  12.0 - 15.0 (g/dL)   HCT 69.6  29.5 - 28.4 (%)   MCV 90.2  78.0 - 100.0 (fL)   MCH 30.8  26.0 - 34.0 (pg)   MCHC 34.2  30.0 - 36.0 (g/dL)   RDW 13.2  44.0 - 10.2 (%)   Platelets 201  150 - 400 (K/uL)  DIFFERENTIAL      Component Value Range   Neutrophils Relative 83 (*) 43 - 77 (%)   Lymphocytes Relative 12  12 - 46 (%)   Monocytes Relative 4  3 - 12 (%)   Eosinophils Relative 0  0 - 5 (%)   Basophils Relative 1  0 - 1 (%)   Neutro Abs 9.0 (*) 1.7 - 7.7 (K/uL)   Lymphs Abs 1.3  0.7 - 4.0 (K/uL)   Monocytes Absolute 0.4  0.1 - 1.0 (K/uL)   Eosinophils Absolute 0.0  0.0 - 0.7 (K/uL)   Basophils  Absolute 0.1  0.0 - 0.1 (K/uL)   Smear Review MORPHOLOGY UNREMARKABLE    URINALYSIS, ROUTINE W REFLEX MICROSCOPIC      Component Value Range   Color, Urine YELLOW  YELLOW    APPearance CLOUDY (*) CLEAR    Specific Gravity, Urine 1.040 (*) 1.005 - 1.030    pH 6.5  5.0 - 8.0    Glucose, UA 100 (*) NEGATIVE (mg/dL)   Hgb urine dipstick LARGE (*) NEGATIVE    Bilirubin Urine NEGATIVE  NEGATIVE    Ketones, ur NEGATIVE  NEGATIVE (mg/dL)   Protein, ur >086 (*) NEGATIVE (mg/dL)   Urobilinogen, UA 0.2  0.0 - 1.0 (mg/dL)   Nitrite NEGATIVE  NEGATIVE    Leukocytes, UA NEGATIVE  NEGATIVE   URINE MICROSCOPIC-ADD ON      Component Value Range   Squamous Epithelial / LPF MANY (*) RARE    WBC, UA 0-2  <3 (WBC/hpf)   RBC / HPF 21-50  <3 (RBC/hpf)   Bacteria, UA MANY (*) RARE    Casts HYALINE CASTS (*) NEGATIVE    Urine-Other AMORPHOUS URATES/PHOSPHATES       Review of Systems  Constitutional: Negative for fever and chills.  Respiratory: Negative for cough and shortness of breath.   Cardiovascular: Negative for chest pain.  Gastrointestinal: Negative for nausea, vomiting and abdominal pain.  Neurological: Negative for headaches.  Endo/Heme/Allergies: Does not bruise/bleed easily.    Blood pressure 158/88, pulse 65, resp. rate 16, SpO2 100.00%. Physical Exam  Constitutional: She is oriented to person, place, and time. She appears well-developed and well-nourished.  Cardiovascular: Normal rate and regular rhythm.   Respiratory: Effort normal.       Few scattered rhonchi  GI: Soft. Bowel sounds are normal.       Hx ventral hernia  Musculoskeletal: Normal range of motion. She exhibits no edema.  Neurological: She is alert and oriented to person, place, and time.     Assessment/Plan Patient with ESRD and clotted left arm AVGG; plan is for thrombolysis, possible angioplasty/stenting of graft or placement of new dialysis catheter if necessary.  Kim Hodge,D KEVIN 01/19/2012, 1:08 PM

## 2012-01-20 ENCOUNTER — Telehealth (HOSPITAL_COMMUNITY): Payer: Self-pay

## 2012-05-03 ENCOUNTER — Other Ambulatory Visit (HOSPITAL_COMMUNITY): Payer: Self-pay | Admitting: Nephrology

## 2012-05-03 DIAGNOSIS — N186 End stage renal disease: Secondary | ICD-10-CM

## 2012-05-04 ENCOUNTER — Telehealth (HOSPITAL_COMMUNITY): Payer: Self-pay

## 2012-05-04 ENCOUNTER — Ambulatory Visit (HOSPITAL_COMMUNITY): Payer: 59

## 2012-05-04 NOTE — Telephone Encounter (Signed)
I called and left  vm for Kim Hodge to call back in order for me to reschedule her appt.  I also spoke with her sister [name]  She is going to try to get in touch with [name] as well.

## 2012-05-09 ENCOUNTER — Ambulatory Visit (HOSPITAL_COMMUNITY)
Admission: RE | Admit: 2012-05-09 | Discharge: 2012-05-09 | Disposition: A | Discharge status: Home / Self Care | Payer: Medicare Other | Source: Ambulatory Visit | Attending: Nephrology | Admitting: Nephrology

## 2012-05-09 ENCOUNTER — Other Ambulatory Visit (HOSPITAL_COMMUNITY): Payer: Self-pay | Admitting: Nephrology

## 2012-05-09 DIAGNOSIS — Z0389 Encounter for observation for other suspected diseases and conditions ruled out: Secondary | ICD-10-CM | POA: Insufficient documentation

## 2012-05-09 DIAGNOSIS — N186 End stage renal disease: Secondary | ICD-10-CM

## 2012-05-09 MED ORDER — FENTANYL CITRATE 0.05 MG/ML IJ SOLN
INTRAMUSCULAR | Status: AC
  Filled 2012-05-09: qty 4

## 2012-05-09 MED ORDER — IOHEXOL 300 MG/ML  SOLN
100.0000 mL | Freq: Once | INTRAMUSCULAR | Status: AC | PRN
  Administered 2012-05-09: 32 mL via INTRAVENOUS

## 2012-05-09 MED ORDER — MIDAZOLAM HCL 2 MG/2ML IJ SOLN
INTRAMUSCULAR | Status: AC
  Filled 2012-05-09: qty 4

## 2012-05-30 ENCOUNTER — Other Ambulatory Visit: Payer: Self-pay | Admitting: Nephrology

## 2012-05-30 ENCOUNTER — Ambulatory Visit
Admission: RE | Admit: 2012-05-30 | Discharge: 2012-05-30 | Disposition: A | Discharge status: Home / Self Care | Payer: Medicare Other | Source: Ambulatory Visit | Attending: Nephrology | Admitting: Nephrology

## 2012-05-30 DIAGNOSIS — R059 Cough, unspecified: Secondary | ICD-10-CM

## 2012-05-30 DIAGNOSIS — R05 Cough: Secondary | ICD-10-CM

## 2012-10-02 ENCOUNTER — Other Ambulatory Visit (HOSPITAL_COMMUNITY): Payer: Self-pay | Admitting: Nephrology

## 2012-10-02 DIAGNOSIS — N186 End stage renal disease: Secondary | ICD-10-CM

## 2012-10-03 ENCOUNTER — Ambulatory Visit (HOSPITAL_COMMUNITY)
Admission: RE | Admit: 2012-10-03 | Discharge: 2012-10-03 | Disposition: A | Discharge status: Home / Self Care | Payer: Medicare Other | Source: Ambulatory Visit | Attending: Nephrology | Admitting: Nephrology

## 2012-10-03 ENCOUNTER — Ambulatory Visit (HOSPITAL_COMMUNITY): Payer: Medicare Other

## 2012-10-03 ENCOUNTER — Other Ambulatory Visit (HOSPITAL_COMMUNITY): Payer: Self-pay | Admitting: Nephrology

## 2012-10-03 DIAGNOSIS — Y832 Surgical operation with anastomosis, bypass or graft as the cause of abnormal reaction of the patient, or of later complication, without mention of misadventure at the time of the procedure: Secondary | ICD-10-CM | POA: Insufficient documentation

## 2012-10-03 DIAGNOSIS — I871 Compression of vein: Secondary | ICD-10-CM | POA: Insufficient documentation

## 2012-10-03 DIAGNOSIS — E669 Obesity, unspecified: Secondary | ICD-10-CM | POA: Insufficient documentation

## 2012-10-03 DIAGNOSIS — N2581 Secondary hyperparathyroidism of renal origin: Secondary | ICD-10-CM | POA: Insufficient documentation

## 2012-10-03 DIAGNOSIS — I120 Hypertensive chronic kidney disease with stage 5 chronic kidney disease or end stage renal disease: Secondary | ICD-10-CM | POA: Insufficient documentation

## 2012-10-03 DIAGNOSIS — N186 End stage renal disease: Secondary | ICD-10-CM

## 2012-10-03 DIAGNOSIS — T82898A Other specified complication of vascular prosthetic devices, implants and grafts, initial encounter: Secondary | ICD-10-CM | POA: Insufficient documentation

## 2012-10-03 DIAGNOSIS — F17200 Nicotine dependence, unspecified, uncomplicated: Secondary | ICD-10-CM | POA: Insufficient documentation

## 2012-10-03 DIAGNOSIS — E785 Hyperlipidemia, unspecified: Secondary | ICD-10-CM | POA: Insufficient documentation

## 2012-10-03 MED ORDER — LIDOCAINE HCL 1 % IJ SOLN
INTRAMUSCULAR | Status: AC
  Filled 2012-10-03: qty 20

## 2012-10-03 MED ORDER — IOHEXOL 300 MG/ML  SOLN
100.0000 mL | Freq: Once | INTRAMUSCULAR | Status: AC | PRN
  Administered 2012-10-03: 36 mL via INTRAVENOUS

## 2012-10-03 NOTE — H&P (Signed)
Kim Hodge is an 56 y.o. female.   Chief Complaint: Increased bleeding at AVG puncture site HPI: as above  Past Medical History  Diagnosis Date  . Hypertension   . Hyperlipidemia   . Obesity   . Chronic kidney disease   . Fibrosis of uterus   . Membranoproliferative glomerulonephritis     type 1  . Diabetes mellitus     reports resolved  . Anemia   . Chronic bronchitis   . Degenerative joint disease   . Hyperparathyroidism, secondary renal   . ESRD (end stage renal disease)     Past Surgical History  Procedure Date  . Av fistula placement 01/02/10  . Abdominal hysterectomy   . Knee arthroscopy right knee 2006    Family History  Problem Relation Age of Onset  . Cancer Mother     breast  . Stroke Father    Social History:  reports that she has been smoking Cigarettes.  She has a 10 pack-year smoking history. She does not have any smokeless tobacco history on file. She reports that she uses illicit drugs (Marijuana). She reports that she does not drink alcohol.  Allergies:  Allergies  Allergen Reactions  . Ace Inhibitors     REACTION: angioedema     (Not in a hospital admission)  No results found for this or any previous visit (from the past 48 hour(s)). No results found.  ROS  There were no vitals taken for this visit. Physical Exam  Constitutional: She is oriented to person, place, and time.  HENT:  Head: Normocephalic and atraumatic.  Neck: Normal range of motion.  Cardiovascular: Normal rate, regular rhythm and normal heart sounds.   Respiratory: Effort normal and breath sounds normal.  GI: Soft. Bowel sounds are normal.  Neurological: She is alert and oriented to person, place, and time.  Skin: Skin is warm and dry.     Assessment/Plan Venous PTA  Kim Hodge,ART A 10/03/2012, 8:45 AM

## 2012-10-03 NOTE — Procedures (Signed)
PTA venous 7 mm No comp 

## 2013-06-05 ENCOUNTER — Encounter: Payer: Self-pay | Admitting: Internal Medicine

## 2013-06-25 ENCOUNTER — Ambulatory Visit: Payer: PRIVATE HEALTH INSURANCE | Admitting: Internal Medicine

## 2014-07-25 ENCOUNTER — Emergency Department (HOSPITAL_COMMUNITY)
Admission: EM | Admit: 2014-07-25 | Discharge: 2014-07-25 | Disposition: A | Discharge status: Home / Self Care | Payer: Medicare Other | Attending: Emergency Medicine | Admitting: Emergency Medicine

## 2014-07-25 ENCOUNTER — Encounter (HOSPITAL_COMMUNITY): Payer: Self-pay | Admitting: Emergency Medicine

## 2014-07-25 ENCOUNTER — Emergency Department (HOSPITAL_COMMUNITY): Payer: Medicare Other

## 2014-07-25 DIAGNOSIS — E669 Obesity, unspecified: Secondary | ICD-10-CM | POA: Insufficient documentation

## 2014-07-25 DIAGNOSIS — R197 Diarrhea, unspecified: Secondary | ICD-10-CM | POA: Diagnosis not present

## 2014-07-25 DIAGNOSIS — J069 Acute upper respiratory infection, unspecified: Secondary | ICD-10-CM | POA: Diagnosis not present

## 2014-07-25 DIAGNOSIS — H109 Unspecified conjunctivitis: Secondary | ICD-10-CM | POA: Insufficient documentation

## 2014-07-25 DIAGNOSIS — Z79899 Other long term (current) drug therapy: Secondary | ICD-10-CM | POA: Diagnosis not present

## 2014-07-25 DIAGNOSIS — Z9889 Other specified postprocedural states: Secondary | ICD-10-CM | POA: Diagnosis not present

## 2014-07-25 DIAGNOSIS — E119 Type 2 diabetes mellitus without complications: Secondary | ICD-10-CM | POA: Diagnosis not present

## 2014-07-25 DIAGNOSIS — R1110 Vomiting, unspecified: Secondary | ICD-10-CM | POA: Diagnosis not present

## 2014-07-25 DIAGNOSIS — I120 Hypertensive chronic kidney disease with stage 5 chronic kidney disease or end stage renal disease: Secondary | ICD-10-CM | POA: Diagnosis not present

## 2014-07-25 DIAGNOSIS — E785 Hyperlipidemia, unspecified: Secondary | ICD-10-CM | POA: Insufficient documentation

## 2014-07-25 DIAGNOSIS — J988 Other specified respiratory disorders: Secondary | ICD-10-CM

## 2014-07-25 DIAGNOSIS — J3489 Other specified disorders of nose and nasal sinuses: Secondary | ICD-10-CM | POA: Diagnosis present

## 2014-07-25 DIAGNOSIS — D649 Anemia, unspecified: Secondary | ICD-10-CM | POA: Diagnosis not present

## 2014-07-25 DIAGNOSIS — Z992 Dependence on renal dialysis: Secondary | ICD-10-CM | POA: Diagnosis not present

## 2014-07-25 DIAGNOSIS — N186 End stage renal disease: Secondary | ICD-10-CM | POA: Diagnosis not present

## 2014-07-25 DIAGNOSIS — Z8742 Personal history of other diseases of the female genital tract: Secondary | ICD-10-CM | POA: Diagnosis not present

## 2014-07-25 DIAGNOSIS — B9789 Other viral agents as the cause of diseases classified elsewhere: Secondary | ICD-10-CM

## 2014-07-25 DIAGNOSIS — F17200 Nicotine dependence, unspecified, uncomplicated: Secondary | ICD-10-CM | POA: Diagnosis not present

## 2014-07-25 LAB — COMPREHENSIVE METABOLIC PANEL
ALT: 21 U/L (ref 0–35)
AST: 26 U/L (ref 0–37)
Albumin: 3.3 g/dL — ABNORMAL LOW (ref 3.5–5.2)
Alkaline Phosphatase: 165 U/L — ABNORMAL HIGH (ref 39–117)
Anion gap: 14 (ref 5–15)
BILIRUBIN TOTAL: 0.5 mg/dL (ref 0.3–1.2)
BUN: 18 mg/dL (ref 6–23)
CHLORIDE: 98 meq/L (ref 96–112)
CO2: 28 meq/L (ref 19–32)
CREATININE: 7.08 mg/dL — AB (ref 0.50–1.10)
Calcium: 9.2 mg/dL (ref 8.4–10.5)
GFR calc Af Amer: 7 mL/min — ABNORMAL LOW (ref >90)
GFR, EST NON AFRICAN AMERICAN: 6 mL/min — AB (ref >90)
Glucose, Bld: 97 mg/dL (ref 70–99)
Potassium: 4.2 mEq/L (ref 3.7–5.3)
SODIUM: 140 meq/L (ref 137–147)
Total Protein: 7.1 g/dL (ref 6.0–8.3)

## 2014-07-25 LAB — I-STAT TROPONIN, ED: TROPONIN I, POC: 0.01 ng/mL (ref 0.00–0.08)

## 2014-07-25 LAB — I-STAT CHEM 8, ED
BUN: 18 mg/dL (ref 6–23)
CALCIUM ION: 1.08 mmol/L — AB (ref 1.12–1.23)
Chloride: 99 mEq/L (ref 96–112)
Creatinine, Ser: 8.1 mg/dL — ABNORMAL HIGH (ref 0.50–1.10)
Glucose, Bld: 98 mg/dL (ref 70–99)
HEMATOCRIT: 33 % — AB (ref 36.0–46.0)
HEMOGLOBIN: 11.2 g/dL — AB (ref 12.0–15.0)
Potassium: 3.8 mEq/L (ref 3.7–5.3)
Sodium: 139 mEq/L (ref 137–147)
TCO2: 27 mmol/L (ref 0–100)

## 2014-07-25 LAB — CBC WITH DIFFERENTIAL/PLATELET
BASOS ABS: 0 10*3/uL (ref 0.0–0.1)
Basophils Relative: 0 % (ref 0–1)
EOS ABS: 0.2 10*3/uL (ref 0.0–0.7)
EOS PCT: 1 % (ref 0–5)
HCT: 33 % — ABNORMAL LOW (ref 36.0–46.0)
Hemoglobin: 10.6 g/dL — ABNORMAL LOW (ref 12.0–15.0)
Lymphocytes Relative: 10 % — ABNORMAL LOW (ref 12–46)
Lymphs Abs: 1.5 10*3/uL (ref 0.7–4.0)
MCH: 31.7 pg (ref 26.0–34.0)
MCHC: 32.1 g/dL (ref 30.0–36.0)
MCV: 98.8 fL (ref 78.0–100.0)
Monocytes Absolute: 1.3 10*3/uL — ABNORMAL HIGH (ref 0.1–1.0)
Monocytes Relative: 9 % (ref 3–12)
Neutro Abs: 11.7 10*3/uL — ABNORMAL HIGH (ref 1.7–7.7)
Neutrophils Relative %: 80 % — ABNORMAL HIGH (ref 43–77)
PLATELETS: 288 10*3/uL (ref 150–400)
RBC: 3.34 MIL/uL — ABNORMAL LOW (ref 3.87–5.11)
RDW: 14.8 % (ref 11.5–15.5)
WBC: 14.7 10*3/uL — ABNORMAL HIGH (ref 4.0–10.5)

## 2014-07-25 MED ORDER — ALBUTEROL SULFATE HFA 108 (90 BASE) MCG/ACT IN AERS
2.0000 | INHALATION_SPRAY | Freq: Once | RESPIRATORY_TRACT | Status: AC
  Administered 2014-07-25: 2 via RESPIRATORY_TRACT
  Filled 2014-07-25: qty 6.7

## 2014-07-25 MED ORDER — SODIUM CHLORIDE 0.9 % IV SOLN: Freq: Once | INTRAVENOUS | Status: DC

## 2014-07-25 MED ORDER — ONDANSETRON 4 MG PO TBDP
4.0000 mg | ORAL_TABLET | Freq: Once | ORAL | Status: AC
  Administered 2014-07-25: 4 mg via ORAL
  Filled 2014-07-25: qty 1

## 2014-07-25 MED ORDER — TOBRAMYCIN 0.3 % OP SOLN
1.0000 [drp] | Freq: Once | OPHTHALMIC | Status: AC
  Administered 2014-07-25: 1 [drp] via OPHTHALMIC
  Filled 2014-07-25: qty 5

## 2014-07-25 MED ORDER — IPRATROPIUM-ALBUTEROL 0.5-2.5 (3) MG/3ML IN SOLN
3.0000 mL | Freq: Once | RESPIRATORY_TRACT | Status: AC
  Administered 2014-07-25: 3 mL via RESPIRATORY_TRACT
  Filled 2014-07-25: qty 3

## 2014-07-25 MED ORDER — BENZONATATE 100 MG PO CAPS: 100.0000 mg | ORAL_CAPSULE | Freq: Three times a day (TID) | ORAL | Status: DC

## 2014-07-25 MED ORDER — ONDANSETRON 4 MG PO TBDP: 4.0000 mg | ORAL_TABLET | Freq: Three times a day (TID) | ORAL | Status: DC | PRN

## 2014-07-25 NOTE — ED Provider Notes (Addendum)
CSN: [id number]     Arrival date & time 07/25/14  0249 History   First MD Initiated Contact with Patient 07/25/14 [phone]     Chief Complaint  Patient presents with  . Nasal Congestion  . Emesis  . Diarrhea     (Consider location/radiation/quality/duration/timing/severity/associated sxs/prior Treatment) Patient is a 58 y.o. female presenting with vomiting and diarrhea. The history is provided by the patient.  Emesis Severity:  Moderate Duration:  3 days Timing:  Intermittent Progression:  Unchanged Chronicity:  New Recent urination:  Absent Relieved by:  Nothing Worsened by:  Nothing tried Ineffective treatments:  None tried Associated symptoms: cough, diarrhea, myalgias and URI   Associated symptoms: no fever   Cough:    Cough characteristics:  Non-productive   Severity:  Moderate   Onset quality:  Gradual   Timing:  Intermittent   Progression:  Worsening   Chronicity:  New Diarrhea Associated symptoms: cough, myalgias, URI and vomiting     Past Medical History  Diagnosis Date  . Hypertension   . Hyperlipidemia   . Obesity   . Chronic kidney disease   . Fibrosis of uterus   . Membranoproliferative glomerulonephritis     type 1  . Diabetes mellitus     reports resolved  . Anemia   . Chronic bronchitis   . Degenerative joint disease   . Hyperparathyroidism, secondary renal   . ESRD (end stage renal disease)    Past Surgical History  Procedure Laterality Date  . Av fistula placement  01/02/10  . Abdominal hysterectomy    . Knee arthroscopy  right knee 2006   Family History  Problem Relation Age of Onset  . Cancer Mother     breast  . Stroke Father    History  Substance Use Topics  . Smoking status: Current Some Day Smoker -- 0.25 packs/day for 40 years    Types: Cigarettes  . Smokeless tobacco: Not on file  . Alcohol Use: No   OB History   Grav Para Term Preterm Abortions TAB SAB Ect Mult Living                 Review of Systems   Gastrointestinal: Positive for vomiting and diarrhea.  Musculoskeletal: Positive for myalgias.      Allergies  Ace inhibitors  Home Medications   Prior to Admission medications   Medication Sig Start Date End Date Taking? Authorizing Provider  acetaminophen (TYLENOL) 500 MG tablet Take 1,500 mg by mouth every 6 (six) hours as needed for moderate pain or headache.   Yes Historical Provider, MD  albuterol (PROVENTIL HFA;VENTOLIN HFA) 108 (90 BASE) MCG/ACT inhaler Inhale 2 puffs into the lungs every 6 (six) hours as needed for wheezing or shortness of breath.   Yes Historical Provider, MD  cinacalcet (SENSIPAR) 30 MG tablet Take 30 mg by mouth daily.     Yes Historical Provider, MD  folic acid-vitamin b complex-vitamin c-selenium-zinc (DIALYVITE) 3 MG TABS Take 1 tablet by mouth daily.     Yes Historical Provider, MD  gabapentin (NEURONTIN) 300 MG capsule Take 300 mg by mouth daily.   Yes Historical Provider, MD  lanthanum (FOSRENOL) 1000 MG chewable tablet Chew 1,000 mg by mouth 3 (three) times daily with meals.   Yes Historical Provider, MD  pravastatin (PRAVACHOL) 20 MG tablet Take 20 mg by mouth daily.     Yes Historical Provider, MD  Pseudoephedrine-Acetaminophen (SINUS PO) Take 1 tablet by mouth daily as needed (for congestion). OTC  Yes Historical Provider, MD  benzonatate (TESSALON) 100 MG capsule Take 1 capsule (100 mg total) by mouth every 8 (eight) hours. 07/25/14   Carlisle Cater, PA-C  ondansetron (ZOFRAN ODT) 4 MG disintegrating tablet Take 1 tablet (4 mg total) by mouth every 8 (eight) hours as needed for nausea or vomiting. 07/25/14   Carlisle Cater, PA-C   BP 135/76  Pulse 77  Temp(Src) 98.3 F (36.8 C) (Oral)  Resp 15  SpO2 95% Physical Exam  Constitutional: She is oriented to person, place, and time. She appears well-developed and well-nourished.  HENT:  Head: Normocephalic and atraumatic.  Right Ear: External ear normal.  Left Ear: External ear normal.  Nose:  Rhinorrhea present. Right sinus exhibits no maxillary sinus tenderness and no frontal sinus tenderness. Left sinus exhibits no maxillary sinus tenderness and no frontal sinus tenderness.  Eyes: Pupils are equal, round, and reactive to light.  Neck: Normal range of motion.  Cardiovascular: Normal rate.   Pulmonary/Chest: Effort normal and breath sounds normal. No respiratory distress. She has no wheezes.  Neurological: She is alert and oriented to person, place, and time.  Skin: Skin is warm and dry.    ED Course  Procedures (including critical care time) Labs Review Labs Reviewed  COMPREHENSIVE METABOLIC PANEL - Abnormal; Notable for the following:    Creatinine, Ser 7.08 (*)    Albumin 3.3 (*)    Alkaline Phosphatase 165 (*)    GFR calc non Af Amer 6 (*)    GFR calc Af Amer 7 (*)    All other components within normal limits  CBC WITH DIFFERENTIAL - Abnormal; Notable for the following:    WBC 14.7 (*)    RBC 3.34 (*)    Hemoglobin 10.6 (*)    HCT 33.0 (*)    Neutrophils Relative % 80 (*)    Neutro Abs 11.7 (*)    Lymphocytes Relative 10 (*)    Monocytes Absolute 1.3 (*)    All other components within normal limits  I-STAT CHEM 8, ED - Abnormal; Notable for the following:    Creatinine, Ser 8.10 (*)    Calcium, Ion 1.08 (*)    Hemoglobin 11.2 (*)    HCT 33.0 (*)    All other components within normal limits  I-STAT TROPOININ, ED    Imaging Review Dg Chest 2 View  07/25/2014   CLINICAL DATA:  Cough, congestion and shortness of breath.  EXAM: CHEST  2 VIEW  COMPARISON:  05/30/2012  FINDINGS: The heart size and mediastinal contours are within normal limits. Both lungs are clear. The visualized skeletal structures are unremarkable.  IMPRESSION: No active cardiopulmonary disease.   Electronically Signed   By: Lucienne Capers M.D.   On: 07/25/2014 05:20     EKG Interpretation None      MDM   Final diagnoses:  Viral respiratory illness  Bilateral conjunctivitis          Garald Balding, NP 07/26/14 0016  Garald Balding, NP 08/06/14 2212

## 2014-07-25 NOTE — ED Notes (Signed)
Pt tolerated PO intake

## 2014-07-25 NOTE — ED Notes (Signed)
Pt. reports nasal congestion , teary eyes , chest congestion  and  productive cough onset yesterday , denies fever or chills.

## 2014-07-25 NOTE — Discharge Instructions (Signed)
Please read and follow all provided instructions.  Your diagnoses today include:  1. Viral respiratory illness   2. Bilateral conjunctivitis    You appear to have an upper respiratory infection (URI). An upper respiratory tract infection, or cold, is a viral infection of the air passages leading to the lungs. It should improve gradually after 5-7 days. You may have a lingering cough that lasts for 2- 4 weeks after the infection.   Tests performed today include:  Vital signs. See below for your results today.   Medications prescribed:   Tessalon Perles - cough suppressant medication   Zofran (ondansetron) - for nausea and vomiting   Albuterol inhaler - medication that opens up your airway  Use inhaler as follows: 1-2 puffs with spacer every 4 hours as needed for wheezing, cough, or shortness of breath.    Tobrex (tobramycin) - antibiotic eye drops or eye ointment  Use this medication as follows:  Use 1-2 drops in affected eye every 4 hours while awake for 5 days.  Take any prescribed medications only as directed. Treatment for your infection is aimed at treating the symptoms. There are no medications, such as antibiotics, that will cure your infection.   Home care instructions:  Follow any educational materials contained in this packet.   Your illness is contagious and can be spread to others, especially during the first 3 or 4 days. It cannot be cured by antibiotics or other medicines. Take basic precautions such as washing your hands often, covering your mouth when you cough or sneeze, and avoiding public places where you could spread your illness to others.   Please continue drinking plenty of fluids.  Use over-the-counter medicines as needed as directed on packaging for symptom relief.  You may also use ibuprofen or tylenol as directed on packaging for pain or fever.  Do not take multiple medicines containing Tylenol or acetaminophen to avoid taking too much of this  medication.  Follow-up instructions: Please follow-up with your primary care provider in the next 3 days for further evaluation of your symptoms if you are not feeling better.   Return instructions:   Please return to the Emergency Department if you experience worsening symptoms.   RETURN IMMEDIATELY IF you develop shortness of breath, confusion or altered mental status, a new rash, become dizzy, faint, or poorly responsive, or are unable to be cared for at home.  Please return if you have persistent vomiting and cannot keep down fluids or develop a fever that is not controlled by tylenol or motrin.    Please return if you have any other emergent concerns.  Additional Information:  Your vital signs today were: BP 137/84   Pulse 100   Temp(Src) 98.3 F (36.8 C) (Oral)   Resp 19   SpO2 93% If your blood pressure (BP) was elevated above 135/85 this visit, please have this repeated by your doctor within one month. --------------

## 2014-07-25 NOTE — ED Provider Notes (Signed)
6:06 AM Handoff from [facility] NP -- pt with cough, h/o ESRD. CXR neg. Plan: albuterol, antiemetics. D/c when feeling better.   8:00 AM Pt states she feels better and cough improved with albuterol. She has tolerated juice without problems.   Will d/c to home with inhaler/tessalon for cough and eye drops for conjunctivitis. Also zofran for vomiting.   Encouraged PCP f/u for recheck in 3-5 days.   Patient urged to return with worsening symptoms or other concerns. Patient verbalized understanding and agrees with plan.    Carlisle Cater, PA-C 07/25/14 [phone]

## 2014-07-27 NOTE — ED Provider Notes (Signed)
Medical screening examination/treatment/procedure(s) were performed by non-physician practitioner and as supervising physician I was immediately available for consultation/collaboration.   EKG Interpretation   Date/Time:  Thursday July 25 2014 04:42:44 EDT Ventricular Rate:  83 PR Interval:  241 QRS Duration: 78 QT Interval:  408 QTC Calculation: 479 R Axis:   21 Text Interpretation:  ed Sinus rhythm Prolonged PR interval RSR' in V1 or  V2, probably normal variant ED PHYSICIAN INTERPRETATION AVAILABLE IN [place]  [place] Confirmed by TEST, Record ([id number]) on 07/27/2014 1:34:26 PM       Merrilee Ancona Alfonso Patten, MD 07/27/14 2313

## 2014-07-27 NOTE — ED Provider Notes (Signed)
Medical screening examination/treatment/procedure(s) were performed by non-physician practitioner and as supervising physician I was immediately available for consultation/collaboration.   EKG Interpretation   Date/Time:  Thursday July 25 2014 04:42:44 EDT Ventricular Rate:  83 PR Interval:  241 QRS Duration: 78 QT Interval:  408 QTC Calculation: 479 R Axis:   21 Text Interpretation:  ed Sinus rhythm Prolonged PR interval RSR' in V1 or  V2, probably normal variant ED PHYSICIAN INTERPRETATION AVAILABLE IN [place]  [place] Confirmed by TEST, Record ([id number]) on 07/27/2014 1:34:26 PM       Kim Hodge Alfonso Patten, MD 07/27/14 2313

## 2014-08-07 NOTE — ED Provider Notes (Signed)
Medical screening examination/treatment/procedure(s) were performed by non-physician practitioner and as supervising physician I was immediately available for consultation/collaboration.   EKG Interpretation   Date/Time:  Thursday July 25 2014 04:42:44 EDT Ventricular Rate:  83 PR Interval:  241 QRS Duration: 78 QT Interval:  408 QTC Calculation: 479 R Axis:   21 Text Interpretation:  ed Sinus rhythm Prolonged PR interval RSR' in V1 or  V2, probably normal variant ED PHYSICIAN INTERPRETATION AVAILABLE IN [place]  [place] Confirmed by TEST, Record ([id number]) on 07/27/2014 1:34:26 PM       Honest Safranek Alfonso Patten, MD 08/07/14 0410

## 2014-08-11 ENCOUNTER — Emergency Department (INDEPENDENT_AMBULATORY_CARE_PROVIDER_SITE_OTHER): Payer: Medicare Other

## 2014-08-11 ENCOUNTER — Emergency Department (INDEPENDENT_AMBULATORY_CARE_PROVIDER_SITE_OTHER)
Admission: EM | Admit: 2014-08-11 | Discharge: 2014-08-11 | Disposition: A | Discharge status: Home / Self Care | Payer: Medicare Other | Source: Home / Self Care | Attending: Emergency Medicine | Admitting: Emergency Medicine

## 2014-08-11 ENCOUNTER — Encounter (HOSPITAL_COMMUNITY): Payer: Self-pay | Admitting: Emergency Medicine

## 2014-08-11 DIAGNOSIS — J189 Pneumonia, unspecified organism: Secondary | ICD-10-CM

## 2014-08-11 MED ORDER — LIDOCAINE HCL (PF) 1 % IJ SOLN
INTRAMUSCULAR | Status: AC
  Filled 2014-08-11: qty 5

## 2014-08-11 MED ORDER — LEVOFLOXACIN 250 MG PO TABS: ORAL_TABLET | ORAL | Status: DC

## 2014-08-11 MED ORDER — CEFTRIAXONE SODIUM 1 G IJ SOLR
1.0000 g | Freq: Once | INTRAMUSCULAR | Status: AC
  Administered 2014-08-11: 1 g via INTRAMUSCULAR

## 2014-08-11 MED ORDER — IPRATROPIUM-ALBUTEROL 0.5-2.5 (3) MG/3ML IN SOLN
3.0000 mL | Freq: Once | RESPIRATORY_TRACT | Status: AC
  Administered 2014-08-11: 3 mL via RESPIRATORY_TRACT

## 2014-08-11 MED ORDER — ALBUTEROL SULFATE (2.5 MG/3ML) 0.083% IN NEBU
5.0000 mg | INHALATION_SOLUTION | Freq: Once | RESPIRATORY_TRACT | Status: AC
  Administered 2014-08-11: 5 mg via RESPIRATORY_TRACT

## 2014-08-11 MED ORDER — METHYLPREDNISOLONE ACETATE 80 MG/ML IJ SUSP
80.0000 mg | Freq: Once | INTRAMUSCULAR | Status: AC
  Administered 2014-08-11: 80 mg via INTRAMUSCULAR

## 2014-08-11 MED ORDER — IPRATROPIUM BROMIDE 0.02 % IN SOLN
RESPIRATORY_TRACT | Status: AC
  Filled 2014-08-11: qty 2.5

## 2014-08-11 MED ORDER — ALBUTEROL SULFATE HFA 108 (90 BASE) MCG/ACT IN AERS
1.0000 | INHALATION_SPRAY | Freq: Four times a day (QID) | RESPIRATORY_TRACT | Status: DC | PRN
Start: 2014-08-11 — End: 2016-07-08

## 2014-08-11 MED ORDER — METHYLPREDNISOLONE SODIUM SUCC 125 MG IJ SOLR
INTRAMUSCULAR | Status: AC
  Filled 2014-08-11: qty 2

## 2014-08-11 MED ORDER — HYDROCOD POLST-CHLORPHEN POLST 10-8 MG/5ML PO LQCR: ORAL | Status: DC

## 2014-08-11 MED ORDER — CEFTRIAXONE SODIUM 1 G IJ SOLR
INTRAMUSCULAR | Status: AC
  Filled 2014-08-11: qty 10

## 2014-08-11 MED ORDER — ALBUTEROL SULFATE (2.5 MG/3ML) 0.083% IN NEBU
INHALATION_SOLUTION | RESPIRATORY_TRACT | Status: AC
  Filled 2014-08-11: qty 6

## 2014-08-11 NOTE — ED Provider Notes (Signed)
Chief Complaint   Chief Complaint  Patient presents with  . Cough    History of Present Illness   Kim Hodge is a 58 year old female on hemodialysis who has had a three-week history of cough productive of white sputum, chest tightness, wheezing, shortness of breath, aching in her chest, a sensation of a lump in her throat, heartburn, indigestion, nasal congestion, rhinorrhea, nausea, and eye discharge and redness. She was seen at the emergency room 2 weeks ago and a chest x-ray was done which was normal. She was prescribed some eyedrops. She states the cough is no better, and she is still having discharge from this. She has an appointment to see an eye doctor in a couple days. She denies any fever or chills.  Review of Systems   Other than as noted above, the patient denies any of the following symptoms: Systemic:  No fevers, chills, sweats, or weight loss. ENT:  No nasal congestion, sneezing, itching, postnasal drip, sinus pressure, headache, sore throat, or hoarseness. Lungs:  No wheezing, shortness of breath, chest tightness or congestion. Heart:  No chest pain, tightness, pressure, PND, orthopnea, or ankle edema. GI:  No indigestion, heartburn, waterbrash, burping, abdominal pain, nausea, or vomiting.  [place]   Past medical history, family history, social history, meds, and allergies were reviewed. She is allergic to ACE inhibitors. She has high blood pressure, high cholesterol, and is on hemodialysis. She takes losartan, pravastatin, albuterol, gabapentin, and Norvasc.  Physical Examination     Vital signs:  BP 174/87  Pulse 96  Temp(Src) 98.2 F (36.8 C) (Oral)  SpO2 100% General:  Alert and oriented.  In no distress.  Skin warm and dry. ENT: TMs and ear canals normal.  Nasal mucosa normal, without drainage.  Pharynx clear without exudate or drainage.  No intraoral lesions. Neck:  No adenopathy, tenderness or mass.  No JVD. Lungs:  No respiratory distress.  Breath sounds  clear and equal bilaterally.  No wheezes, rales or rhonchi. Heart:  Regular rhythm, no gallops or murmers.  No pedal edema. Abdomon:  Soft and nontender.  No organomegaly or mass.  Radiology   Dg Chest 2 View  08/11/2014   CLINICAL DATA:  Cough, shortness of breath and congestion for 3 weeks, history hypertension, end-stage renal disease, diabetes  EXAM: CHEST  2 VIEW  COMPARISON:  07/25/2014  FINDINGS: Enlargement of cardiac silhouette.  Elongation of thoracic aorta with atherosclerotic calcification arch.  Pulmonary vascularity normal.  New bibasilar infiltrates question pneumonia.  Remaining lungs clear.  No pleural effusion or pneumothorax.  IMPRESSION: New bibasilar infiltrates question pneumonia.   Electronically Signed   By: Lavonia Dana M.D.   On: 08/11/2014 11:43   Dg Chest 2 View  07/25/2014   CLINICAL DATA:  Cough, congestion and shortness of breath.  EXAM: CHEST  2 VIEW  COMPARISON:  05/30/2012  FINDINGS: The heart size and mediastinal contours are within normal limits. Both lungs are clear. The visualized skeletal structures are unremarkable.  IMPRESSION: No active cardiopulmonary disease.   Electronically Signed   By: Lucienne Capers M.D.   On: 07/25/2014 05:20   Course in [facility] [place]   She was given Rocephin 1000 mg IM.  Assessment   The encounter diagnosis was Community acquired pneumonia.  Plan     1.  Meds:  The following meds were prescribed:   Discharge Medication List as of 08/11/2014 11:50 AM    START taking these medications   Details  !! albuterol (PROVENTIL  HFA;VENTOLIN HFA) 108 (90 BASE) MCG/ACT inhaler Inhale 1-2 puffs into the lungs every 6 (six) hours as needed for wheezing or shortness of breath., Starting 08/11/2014, Until Discontinued, Normal    chlorpheniramine-HYDROcodone (TUSSIONEX) 10-8 MG/5ML LQCR Take 5 mL after each dialysis treatment, 3 times weekly., Normal    levofloxacin (LEVAQUIN) 250 MG tablet 2 pills on day 1, then 1 every other day,  Normal     !! - Potential duplicate medications found. Please discuss with provider.      2.  Patient Education/Counseling:  The patient was given appropriate handouts, self care instructions, and instructed in symptomatic relief.  The patient was told to come back here in 48 hours for recheck and also was told that she would need a followup chest x-ray in one month to document clearing.  3.  Follow up:  The patient was told to follow up here in 2 days for recheck, then in a month for repeat chest x-ray, or sooner if becoming worse in any way, and given some red flag symptoms such as difficulty breathing or chest pain which would prompt immediate return.         Harden Mo, MD 08/11/14 [phone]
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
## 2014-08-11 NOTE — Discharge Instructions (Signed)

## 2014-08-13 ENCOUNTER — Encounter (HOSPITAL_COMMUNITY): Payer: Self-pay | Admitting: Emergency Medicine

## 2014-08-13 ENCOUNTER — Emergency Department (INDEPENDENT_AMBULATORY_CARE_PROVIDER_SITE_OTHER)
Admission: EM | Admit: 2014-08-13 | Discharge: 2014-08-13 | Disposition: A | Discharge status: Home / Self Care | Payer: Medicare Other | Source: Home / Self Care | Attending: Family Medicine | Admitting: Family Medicine

## 2014-08-13 DIAGNOSIS — Z992 Dependence on renal dialysis: Secondary | ICD-10-CM

## 2014-08-13 DIAGNOSIS — J189 Pneumonia, unspecified organism: Secondary | ICD-10-CM

## 2014-08-13 NOTE — ED Provider Notes (Signed)
CSN: [id number]     Arrival date & time 08/13/14  1052 History   First MD Initiated Contact with Patient 08/13/14 1123     Chief Complaint  Patient presents with  . Follow-up   (Consider location/radiation/quality/duration/timing/severity/associated sxs/prior Treatment) HPI  Pt seeni at UC 2 days ago for CAP and started on albuterol adn Levaquin. Also given Rocephin at time of visit. Last note reviewed in full. Since discharge breathing has improved. Still feeling cold all over. Difficulty laying down to sleep. Still no appetite. Still feeling tired and sluggish. Diarrhea.  Denies CP, palpitations, sycnope, abd pain.  SOB improving.    Past Medical History  Diagnosis Date  . Hypertension   . Hyperlipidemia   . Obesity   . Chronic kidney disease   . Fibrosis of uterus   . Membranoproliferative glomerulonephritis     type 1  . Diabetes mellitus     reports resolved  . Anemia   . Chronic bronchitis   . Degenerative joint disease   . Hyperparathyroidism, secondary renal   . ESRD (end stage renal disease)    Past Surgical History  Procedure Laterality Date  . Av fistula placement  01/02/10  . Abdominal hysterectomy    . Knee arthroscopy  right knee 2006   Family History  Problem Relation Age of Onset  . Cancer Mother     breast  . Stroke Father    History  Substance Use Topics  . Smoking status: Current Some Day Smoker -- 0.25 packs/day for 40 years    Types: Cigarettes  . Smokeless tobacco: Not on file  . Alcohol Use: No   OB History   Grav Para Term Preterm Abortions TAB SAB Ect Mult Living                 Review of Systems Per HPI with all other pertinent systems negative.   Allergies  Ace inhibitors  Home Medications   Prior to Admission medications   Medication Sig Start Date End Date Taking? Authorizing Provider  acetaminophen (TYLENOL) 500 MG tablet Take 1,500 mg by mouth every 6 (six) hours as needed for moderate pain or headache.    Historical  Provider, MD  albuterol (PROVENTIL HFA;VENTOLIN HFA) 108 (90 BASE) MCG/ACT inhaler Inhale 2 puffs into the lungs every 6 (six) hours as needed for wheezing or shortness of breath.    Historical Provider, MD  albuterol (PROVENTIL HFA;VENTOLIN HFA) 108 (90 BASE) MCG/ACT inhaler Inhale 1-2 puffs into the lungs every 6 (six) hours as needed for wheezing or shortness of breath. 08/11/14   Harden Mo, MD  benzonatate (TESSALON) 100 MG capsule Take 1 capsule (100 mg total) by mouth every 8 (eight) hours. 07/25/14   Carlisle Cater, PA-C  chlorpheniramine-HYDROcodone (TUSSIONEX) 10-8 MG/5ML LQCR Take 5 mL after each dialysis treatment, 3 times weekly. 08/11/14   Harden Mo, MD  cinacalcet (SENSIPAR) 30 MG tablet Take 30 mg by mouth daily.      Historical Provider, MD  folic acid-vitamin b complex-vitamin c-selenium-zinc (DIALYVITE) 3 MG TABS Take 1 tablet by mouth daily.      Historical Provider, MD  gabapentin (NEURONTIN) 300 MG capsule Take 300 mg by mouth daily.    Historical Provider, MD  lanthanum (FOSRENOL) 1000 MG chewable tablet Chew 1,000 mg by mouth 3 (three) times daily with meals.    Historical Provider, MD  levofloxacin (LEVAQUIN) 250 MG tablet 2 pills on day 1, then 1 every other day 08/11/14   Shanon Brow  Collier Bullock, MD  ondansetron (ZOFRAN ODT) 4 MG disintegrating tablet Take 1 tablet (4 mg total) by mouth every 8 (eight) hours as needed for nausea or vomiting. 07/25/14   Carlisle Cater, PA-C  pravastatin (PRAVACHOL) 20 MG tablet Take 20 mg by mouth daily.      Historical Provider, MD  Pseudoephedrine-Acetaminophen (SINUS PO) Take 1 tablet by mouth daily as needed (for congestion). OTC    Historical Provider, MD   BP 177/85  Pulse 80  Temp(Src) 98.2 F (36.8 C) (Oral)  Resp 16  SpO2 100% Physical Exam  Constitutional: She is oriented to person, place, and time. She appears well-developed and well-nourished. No distress.  HENT:  Head: Normocephalic and atraumatic.  Eyes: EOM are normal. Pupils  are equal, round, and reactive to light.  Neck: Normal range of motion.  Cardiovascular: Normal rate, normal heart sounds and intact distal pulses.   No murmur heard. Pulmonary/Chest: Effort normal. She has no rales. She exhibits no tenderness.  Decreased breath sound in the bases especially ion the R. No Egophony. No wheezes, w/ good air movement  Abdominal: Soft. She exhibits no distension.  Musculoskeletal: Normal range of motion. She exhibits no tenderness.  Neurological: She is alert and oriented to person, place, and time.  Skin: Skin is warm. She is not diaphoretic.  Psychiatric: She has a normal mood and affect. Her behavior is normal. Judgment and thought content normal.    ED Course  Procedures (including critical care time) Labs Review Labs Reviewed - No data to display  Imaging Review No results found.   MDM   1. CAP (community acquired pneumonia)   2. Dialysis patient    Dialysis pt on levaquin for CAP. Improving. Continue current regimen of 250mg  QOD This will take several weeks to fully recover from. Rest and stay active. Regular deep breathing.  Add probiotic and live active culture foods for diarrhea.  Continue levaquin Dialysis tomorrow. Go to ED if unable to keep appointment or if respiratory symptoms worsen -Precautions given and all questions answered   Linna Darner, MD Family Medicine 08/13/2014, 11:40 AM      Waldemar Dickens, MD 08/13/14 1140

## 2014-08-13 NOTE — ED Notes (Signed)
Seen 8/16 for pneumonia.  Returned today for follow up.  Breathing is better, but overall not feeling well.

## 2014-08-13 NOTE — Discharge Instructions (Signed)
You are doing great You had a severe pneumonia that will take time to heal Please get lots of rest but stay active when you are awake.  Make sure to take deep breaths on a regular basis and exercise when able.  Please try adding foods with live active cultures to your diet to combat the diarrhea such as yogurt and cheese Please go to the emergency room if you get worse.

## 2014-08-15 ENCOUNTER — Other Ambulatory Visit: Payer: Self-pay

## 2014-08-15 DIAGNOSIS — Z1231 Encounter for screening mammogram for malignant neoplasm of breast: Secondary | ICD-10-CM

## 2014-08-22 ENCOUNTER — Ambulatory Visit
Admission: RE | Admit: 2014-08-22 | Discharge: 2014-08-22 | Disposition: A | Discharge status: Home / Self Care | Payer: PRIVATE HEALTH INSURANCE | Source: Ambulatory Visit

## 2014-08-22 DIAGNOSIS — Z1231 Encounter for screening mammogram for malignant neoplasm of breast: Secondary | ICD-10-CM

## 2014-08-23 ENCOUNTER — Other Ambulatory Visit: Payer: Self-pay | Admitting: Nephrology

## 2014-08-23 DIAGNOSIS — R928 Other abnormal and inconclusive findings on diagnostic imaging of breast: Secondary | ICD-10-CM

## 2014-08-26 ENCOUNTER — Other Ambulatory Visit: Payer: Self-pay | Admitting: Nephrology

## 2014-08-26 DIAGNOSIS — M7989 Other specified soft tissue disorders: Secondary | ICD-10-CM

## 2014-08-28 ENCOUNTER — Ambulatory Visit
Admission: RE | Admit: 2014-08-28 | Discharge: 2014-08-28 | Disposition: A | Discharge status: Home / Self Care | Payer: Medicare Other | Source: Ambulatory Visit | Attending: Nephrology | Admitting: Nephrology

## 2014-08-28 DIAGNOSIS — M7989 Other specified soft tissue disorders: Secondary | ICD-10-CM

## 2014-09-03 ENCOUNTER — Ambulatory Visit
Admission: RE | Admit: 2014-09-03 | Discharge: 2014-09-03 | Disposition: A | Discharge status: Home / Self Care | Payer: Medicare Other | Source: Ambulatory Visit | Attending: Nephrology | Admitting: Nephrology

## 2014-09-03 DIAGNOSIS — R928 Other abnormal and inconclusive findings on diagnostic imaging of breast: Secondary | ICD-10-CM

## 2014-10-01 ENCOUNTER — Emergency Department (HOSPITAL_COMMUNITY): Payer: No Typology Code available for payment source

## 2014-10-01 ENCOUNTER — Emergency Department (HOSPITAL_COMMUNITY)
Admission: EM | Admit: 2014-10-01 | Discharge: 2014-10-01 | Disposition: A | Discharge status: Home / Self Care | Payer: No Typology Code available for payment source | Attending: Emergency Medicine | Admitting: Emergency Medicine

## 2014-10-01 ENCOUNTER — Encounter (HOSPITAL_COMMUNITY): Payer: Self-pay | Admitting: Emergency Medicine

## 2014-10-01 DIAGNOSIS — D649 Anemia, unspecified: Secondary | ICD-10-CM | POA: Insufficient documentation

## 2014-10-01 DIAGNOSIS — E785 Hyperlipidemia, unspecified: Secondary | ICD-10-CM | POA: Insufficient documentation

## 2014-10-01 DIAGNOSIS — Y9389 Activity, other specified: Secondary | ICD-10-CM | POA: Insufficient documentation

## 2014-10-01 DIAGNOSIS — E669 Obesity, unspecified: Secondary | ICD-10-CM | POA: Diagnosis not present

## 2014-10-01 DIAGNOSIS — Y92410 Unspecified street and highway as the place of occurrence of the external cause: Secondary | ICD-10-CM | POA: Insufficient documentation

## 2014-10-01 DIAGNOSIS — Z720 Tobacco use: Secondary | ICD-10-CM | POA: Diagnosis not present

## 2014-10-01 DIAGNOSIS — Z8742 Personal history of other diseases of the female genital tract: Secondary | ICD-10-CM | POA: Diagnosis not present

## 2014-10-01 DIAGNOSIS — S134XXA Sprain of ligaments of cervical spine, initial encounter: Secondary | ICD-10-CM | POA: Diagnosis not present

## 2014-10-01 DIAGNOSIS — S161XXA Strain of muscle, fascia and tendon at neck level, initial encounter: Secondary | ICD-10-CM

## 2014-10-01 DIAGNOSIS — E119 Type 2 diabetes mellitus without complications: Secondary | ICD-10-CM | POA: Insufficient documentation

## 2014-10-01 DIAGNOSIS — I120 Hypertensive chronic kidney disease with stage 5 chronic kidney disease or end stage renal disease: Secondary | ICD-10-CM | POA: Diagnosis not present

## 2014-10-01 DIAGNOSIS — N186 End stage renal disease: Secondary | ICD-10-CM | POA: Insufficient documentation

## 2014-10-01 DIAGNOSIS — M1990 Unspecified osteoarthritis, unspecified site: Secondary | ICD-10-CM | POA: Diagnosis not present

## 2014-10-01 DIAGNOSIS — Z79899 Other long term (current) drug therapy: Secondary | ICD-10-CM | POA: Diagnosis not present

## 2014-10-01 DIAGNOSIS — S0990XA Unspecified injury of head, initial encounter: Secondary | ICD-10-CM | POA: Insufficient documentation

## 2014-10-01 DIAGNOSIS — S3982XA Other specified injuries of lower back, initial encounter: Secondary | ICD-10-CM | POA: Insufficient documentation

## 2014-10-01 MED ORDER — HYDROCODONE-ACETAMINOPHEN 5-325 MG PO TABS: 1.0000 | ORAL_TABLET | ORAL | Status: DC | PRN
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
## 2014-12-25 ENCOUNTER — Ambulatory Visit (HOSPITAL_COMMUNITY): Payer: Medicare Other | Attending: Emergency Medicine

## 2014-12-25 ENCOUNTER — Encounter (HOSPITAL_COMMUNITY): Payer: Self-pay | Admitting: *Deleted

## 2014-12-25 ENCOUNTER — Emergency Department (INDEPENDENT_AMBULATORY_CARE_PROVIDER_SITE_OTHER)
Admission: EM | Admit: 2014-12-25 | Discharge: 2014-12-25 | Disposition: A | Discharge status: Home / Self Care | Payer: Medicare Other | Source: Home / Self Care | Attending: Emergency Medicine | Admitting: Emergency Medicine

## 2014-12-25 DIAGNOSIS — Z8709 Personal history of other diseases of the respiratory system: Secondary | ICD-10-CM | POA: Diagnosis not present

## 2014-12-25 DIAGNOSIS — H838X9 Other specified diseases of inner ear, unspecified ear: Secondary | ICD-10-CM | POA: Diagnosis present

## 2014-12-25 DIAGNOSIS — R05 Cough: Secondary | ICD-10-CM | POA: Diagnosis present

## 2014-12-25 DIAGNOSIS — J018 Other acute sinusitis: Secondary | ICD-10-CM

## 2014-12-25 DIAGNOSIS — R0981 Nasal congestion: Secondary | ICD-10-CM | POA: Diagnosis present

## 2014-12-25 MED ORDER — AMOXICILLIN-POT CLAVULANATE 500-125 MG PO TABS: 1.0000 | ORAL_TABLET | Freq: Every day | ORAL | Status: DC

## 2014-12-25 NOTE — ED Notes (Signed)
Pt  Reports  Symptoms  Of  Cough   Congestion         Stuffy  Nose      And       Sinus  Drainage    X  10  Days  shereports ears  Seen  Stopped  Up  As  Well    Pt  Is  Dialysis  Pt  dialysed  Today

## 2014-12-25 NOTE — ED Provider Notes (Signed)
CSN: [id number]     Arrival date & time 12/25/14  1441 History   First MD Initiated Contact with Patient 12/25/14 1510     Chief Complaint  Patient presents with  . URI   (Consider location/radiation/quality/duration/timing/severity/associated sxs/prior Treatment) HPI          58 year old female presents complaining of cough, nasal congestion, sinus pressure, ear pressure. Her symptoms first started approximally 2 weeks ago and has been getting progressively worse. She is taking over-the-counter medication without relief. No fever, chills, NVD. She is a dialysis patient. She has a history of pneumonia about 4 months ago.   Past Medical History  Diagnosis Date  . Hypertension   . Hyperlipidemia   . Obesity   . Chronic kidney disease   . Fibrosis of uterus   . Membranoproliferative glomerulonephritis     type 1  . Diabetes mellitus     reports resolved  . Anemia   . Chronic bronchitis   . Degenerative joint disease   . Hyperparathyroidism, secondary renal   . ESRD (end stage renal disease)    Past Surgical History  Procedure Laterality Date  . Av fistula placement  01/02/10  . Abdominal hysterectomy    . Knee arthroscopy  right knee 2006   Family History  Problem Relation Age of Onset  . Cancer Mother     breast  . Stroke Father    History  Substance Use Topics  . Smoking status: Current Some Day Smoker -- 0.25 packs/day for 40 years    Types: Cigarettes  . Smokeless tobacco: Not on file  . Alcohol Use: No   OB History    No data available     Review of Systems  Constitutional: Positive for fatigue. Negative for fever and chills.  HENT: Positive for congestion, ear pain, hearing loss, postnasal drip, rhinorrhea, sinus pressure and sore throat.   Respiratory: Positive for cough and shortness of breath. Negative for chest tightness.   Cardiovascular: Negative for chest pain and leg swelling.  All other systems reviewed and are negative.   Allergies  Ace  inhibitors  Home Medications   Prior to Admission medications   Medication Sig Start Date End Date Taking? Authorizing Provider  acetaminophen (TYLENOL) 500 MG tablet Take 1,500 mg by mouth every 6 (six) hours as needed for moderate pain or headache.    Historical Provider, MD  albuterol (PROVENTIL HFA;VENTOLIN HFA) 108 (90 BASE) MCG/ACT inhaler Inhale 1-2 puffs into the lungs every 6 (six) hours as needed for wheezing or shortness of breath. 08/11/14   Harden Mo, MD  amLODipine (NORVASC) 10 MG tablet Take 10 mg by mouth daily.    Historical Provider, MD  amoxicillin-clavulanate (AUGMENTIN) 500-125 MG per tablet Take 1 tablet (500 mg total) by mouth daily. On dialysis days, take after dialysis 12/25/14   Liam Graham, PA-C  cinacalcet (SENSIPAR) 60 MG tablet Take 60 mg by mouth daily.    Historical Provider, MD  folic acid-vitamin b complex-vitamin c-selenium-zinc (DIALYVITE) 3 MG TABS Take 1 tablet by mouth daily.      Historical Provider, MD  gabapentin (NEURONTIN) 300 MG capsule Take 300 mg by mouth daily.    Historical Provider, MD  HYDROcodone-acetaminophen (NORCO/VICODIN) 5-325 MG per tablet Take 1 tablet by mouth every 4 (four) hours as needed for moderate pain. 10/01/14   Orpah Greek, MD  lanthanum (FOSRENOL) 1000 MG chewable tablet Chew 1,000 mg by mouth 3 (three) times daily with meals.  Historical Provider, MD  losartan (COZAAR) 100 MG tablet Take 100 mg by mouth daily.    Historical Provider, MD  pravastatin (PRAVACHOL) 20 MG tablet Take 20 mg by mouth daily.      Historical Provider, MD  Pseudoephedrine-Acetaminophen (SINUS PO) Take 1 tablet by mouth daily as needed (for congestion). OTC    Historical Provider, MD   BP 164/78 mmHg  Pulse 96  Temp(Src) 98.3 F (36.8 C) (Oral)  Resp 16  SpO2 97% Physical Exam  Constitutional: She is oriented to person, place, and time. Vital signs are normal. She appears well-developed and well-nourished. No distress.  HENT:   Head: Normocephalic and atraumatic.  Right Ear: Tympanic membrane, external ear and ear canal normal.  Left Ear: Tympanic membrane, external ear and ear canal normal.  Nose: Nose normal. Right sinus exhibits no maxillary sinus tenderness and no frontal sinus tenderness. Left sinus exhibits no maxillary sinus tenderness and no frontal sinus tenderness.  Mouth/Throat: Uvula is midline, oropharynx is clear and moist and mucous membranes are normal. No oropharyngeal exudate or posterior oropharyngeal erythema.  Eyes: Conjunctivae are normal. Right eye exhibits no discharge. Left eye exhibits no discharge.  Cardiovascular: Normal rate, regular rhythm and normal heart sounds.   Pulmonary/Chest: Effort normal and breath sounds normal. No respiratory distress.  Abdominal: Bowel sounds are normal. She exhibits no distension and no mass. There is no tenderness. There is no rebound and no guarding.  Neurological: She is alert and oriented to person, place, and time. She has normal strength. Coordination normal.  Skin: Skin is warm and dry. No rash noted. She is not diaphoretic.  Psychiatric: She has a normal mood and affect. Judgment normal.  Nursing note and vitals reviewed.   ED Course  Procedures (including critical care time) Labs Review Labs Reviewed - No data to display  Imaging Review Dg Chest 2 View  12/25/2014   CLINICAL DATA:  Cough, nasal and ear congestion for the past several days; history of chronic bronchitis  EXAM: CHEST  2 VIEW  COMPARISON:  PA and lateral chest x-ray of August 11, 2014  FINDINGS: The lungs are adequately inflated and clear. The heart and mediastinal structures are unremarkable. There is no pleural effusion. The bony thorax is unremarkable.  IMPRESSION: There is no pneumonia nor other acute cardiopulmonary abnormality.   Electronically Signed   By: David  Martinique   On: 12/25/2014 15:58     MDM   1. Other acute sinusitis    CXR normal.  Treat with augmentin for  sinusitis, renal dose in 500 QD.  F/u PRN  Meds ordered this encounter  Medications  . amoxicillin-clavulanate (AUGMENTIN) 500-125 MG per tablet    Sig: Take 1 tablet (500 mg total) by mouth daily. On dialysis days, take after dialysis    Dispense:  10 tablet    Refill:  0    Order Specific Question:  Supervising Provider    Answer:  Jake Michaelis, DAVID C [[id number]]       Liam Graham, PA-C 12/25/14 [phone]

## 2014-12-25 NOTE — Discharge Instructions (Signed)

## 2015-06-05 ENCOUNTER — Ambulatory Visit (INDEPENDENT_AMBULATORY_CARE_PROVIDER_SITE_OTHER): Payer: Medicare Other | Admitting: Internal Medicine

## 2015-06-05 ENCOUNTER — Encounter: Payer: Self-pay | Admitting: Internal Medicine

## 2015-06-05 VITALS — BP 142/84 | HR 92 | Temp 98.1°F | Resp 16 | Ht 63.0 in | Wt 177.0 lb

## 2015-06-05 DIAGNOSIS — N186 End stage renal disease: Secondary | ICD-10-CM | POA: Diagnosis not present

## 2015-06-05 DIAGNOSIS — Z1211 Encounter for screening for malignant neoplasm of colon: Secondary | ICD-10-CM

## 2015-06-05 DIAGNOSIS — J302 Other seasonal allergic rhinitis: Secondary | ICD-10-CM | POA: Diagnosis not present

## 2015-06-05 DIAGNOSIS — Z720 Tobacco use: Secondary | ICD-10-CM | POA: Diagnosis not present

## 2015-06-05 DIAGNOSIS — I10 Essential (primary) hypertension: Secondary | ICD-10-CM | POA: Diagnosis not present

## 2015-06-05 DIAGNOSIS — K429 Umbilical hernia without obstruction or gangrene: Secondary | ICD-10-CM

## 2015-06-05 DIAGNOSIS — J309 Allergic rhinitis, unspecified: Secondary | ICD-10-CM | POA: Insufficient documentation

## 2015-06-05 DIAGNOSIS — Z992 Dependence on renal dialysis: Secondary | ICD-10-CM

## 2015-06-05 DIAGNOSIS — E785 Hyperlipidemia, unspecified: Secondary | ICD-10-CM

## 2015-06-05 MED ORDER — FLUTICASONE PROPIONATE 50 MCG/ACT NA SUSP: 2.0000 | Freq: Every day | NASAL | Status: DC

## 2015-06-05 NOTE — Assessment & Plan Note (Signed)
Currently on pravastatin without side effects. Continue and obtain records including last lipid panel. Adjust as needed. No history of CAD although she is ESRD.

## 2015-06-05 NOTE — Assessment & Plan Note (Signed)
ESRD kidney disease. Currently on amlodipine 10 mg daily and losartan 100 mg daily. Will obtain records from her nephrologist.

## 2015-06-05 NOTE — Progress Notes (Signed)
Pre visit review using our clinic review tool, if applicable. No additional management support is needed unless otherwise documented below in the visit note. 

## 2015-06-05 NOTE — Assessment & Plan Note (Signed)
She is not ready to stop smoking at this time. Talked to her about the fact that her renal disease puts her at increased risk of heart attack and stroke and smoking raises this risk. She is pre-contemplative and I reminded her that she should quit. Information given at quitting today and talked with her about the options available to help her quit.

## 2015-06-05 NOTE — Progress Notes (Signed)
   Subjective:    Patient ID: Kim Hodge, female    DOB: 1956/10/21, 59 y.o.   MRN: [id number]  HPI  The patient is a 59 YO female coming in new for allergies and sinus troubles. She does suffer year round with severe allergies. She has some ear pain and nasal congestion. No fevers or chills. Has been taking some over the counter medicine which is not helping. She is a smoker and is not able to quit due to stressors in her life. She does have albuterol inhaler which she uses rarely for SOB.   She also has a hernia on her stomach where she had a hysterectomy (done for ovarian cyst, non-malignant). It has been growing some with time. Does not hurt and does not stay protruding.   Started dialysis (for uncontrolled hypertension) 6 years ago December. She does line dancing several times per week for exercise and enjoyment.   PMH, [facility], social history reviewed and updated with the patient.   Review of Systems  Constitutional: Positive for fatigue. Negative for fever, chills, activity change and appetite change.  HENT: Positive for congestion, ear pain, postnasal drip and rhinorrhea. Negative for facial swelling, sinus pressure, sore throat and trouble swallowing.   Eyes: Negative.   Respiratory: Negative for cough, chest tightness, shortness of breath and wheezing.   Cardiovascular: Negative for chest pain, palpitations and leg swelling.  Gastrointestinal: Positive for diarrhea, constipation and abdominal distention. Negative for abdominal pain.       Some bowel changes since starting dialysis  Musculoskeletal: Negative.   Skin: Negative.   Neurological: Negative for dizziness, seizures, speech difficulty and weakness.  Psychiatric/Behavioral: Negative.       Objective:   Physical Exam  Constitutional: She is oriented to person, place, and time. She appears well-developed and well-nourished.  HENT:  Head: Normocephalic and atraumatic.  Ears with clear TMs bilaterally, oropharynx with mild  erythema and turbinates with swelling and redness.   Eyes: EOM are normal.  Neck: Normal range of motion.  Cardiovascular: Normal rate and regular rhythm.   Pulmonary/Chest: Effort normal and breath sounds normal. No respiratory distress. She has no wheezes.  Abdominal: Soft. She exhibits no distension. There is no tenderness. There is no rebound.  3-4 cm umbilical hernia, easily reducible.   Musculoskeletal: She exhibits no edema.  Neurological: She is alert and oriented to person, place, and time. Coordination normal.  Skin: Skin is warm and dry.   Filed Vitals:   06/05/15 0855  BP: 142/84  Pulse: 92  Temp: 98.1 F (36.7 C)  TempSrc: Oral  Resp: 16  Height: 5\' 3"  (1.6 m)  Weight: 177 lb (80.287 kg)  SpO2: 90%      Assessment & Plan:

## 2015-06-05 NOTE — Patient Instructions (Signed)
We have sent in the flonase which is the nose spray for the allergies. Use 2 puffs in each nostril once a day. You can use it year round or just during the worst allergy times.   The hernia on the stomach is not harmful and fixing it could cause more hernias in the future. If it stays out or keeps growing we can think about getting it fixed.   Come back in about 1 year. We will have the GI office call you about getting a colonoscopy. We will also get your records from Dr. Charlyne Petrin so we can make sure we are not missing anything.   If you have problems or questions please feel free to come back sooner.   If you are ready to stop smoking call us and we can help you with an option to give you a better chance to quit.   Smoking Cessation Quitting smoking is important to your health and has many advantages. However, it is not always easy to quit since nicotine is a very addictive drug. Oftentimes, people try 3 times or more before being able to quit. This document explains the best ways for you to prepare to quit smoking. Quitting takes hard work and a lot of effort, but you can do it. ADVANTAGES OF QUITTING SMOKING  You will live longer, feel better, and live better.  Your body will feel the impact of quitting smoking almost immediately.  Within 20 minutes, blood pressure decreases. Your pulse returns to its normal level.  After 8 hours, carbon monoxide levels in the blood return to normal. Your oxygen level increases.  After 24 hours, the chance of having a heart attack starts to decrease. Your breath, hair, and body stop smelling like smoke.  After 48 hours, damaged nerve endings begin to recover. Your sense of taste and smell improve.  After 72 hours, the body is virtually free of nicotine. Your bronchial tubes relax and breathing becomes easier.  After 2 to 12 weeks, lungs can hold more air. Exercise becomes easier and circulation improves.  The risk of having a heart attack, stroke,  cancer, or lung disease is greatly reduced.  After 1 year, the risk of coronary heart disease is cut in half.  After 5 years, the risk of stroke falls to the same as a nonsmoker.  After 10 years, the risk of lung cancer is cut in half and the risk of other cancers decreases significantly.  After 15 years, the risk of coronary heart disease drops, usually to the level of a nonsmoker.  If you are pregnant, quitting smoking will improve your chances of having a healthy baby.  The people you live with, especially any children, will be healthier.  You will have extra money to spend on things other than cigarettes. QUESTIONS TO THINK ABOUT BEFORE ATTEMPTING TO QUIT You may want to talk about your answers with your health care provider.  Why do you want to quit?  If you tried to quit in the past, what helped and what did not?  What will be the most difficult situations for you after you quit? How will you plan to handle them?  Who can help you through the tough times? Your family? Friends? A health care provider?  What pleasures do you get from smoking? What ways can you still get pleasure if you quit? Here are some questions to ask your health care provider:  How can you help me to be successful at quitting?  What medicine  do you think would be best for me and how should I take it?  What should I do if I need more help?  What is smoking withdrawal like? How can I get information on withdrawal? GET READY  Set a quit date.  Change your environment by getting rid of all cigarettes, ashtrays, matches, and lighters in your home, car, or work. Do not let people smoke in your home.  Review your past attempts to quit. Think about what worked and what did not. GET SUPPORT AND ENCOURAGEMENT You have a better chance of being successful if you have help. You can get support in many ways.  Tell your family, friends, and coworkers that you are going to quit and need their support. Ask them  not to smoke around you.  Get individual, group, or telephone counseling and support. Programs are available at [employer] and health centers. Call your local health department for information about programs in your area.  Spiritual beliefs and practices may help some smokers quit.  Download a "quit meter" on your computer to keep track of quit statistics, such as how long you have gone without smoking, cigarettes not smoked, and money saved.  Get a self-help book about quitting smoking and staying off tobacco. [place] yourself from urges to smoke. Talk to someone, go for a walk, or occupy your time with a task.  Change your normal routine. Take a different route to work. Drink tea instead of coffee. Eat breakfast in a different place.  Reduce your stress. Take a hot bath, exercise, or read a book.  Plan something enjoyable to do every day. Reward yourself for not smoking.  Explore interactive web-based programs that specialize in helping you quit. GET MEDICINE AND USE IT CORRECTLY Medicines can help you stop smoking and decrease the urge to smoke. Combining medicine with the above behavioral methods and support can greatly increase your chances of successfully quitting smoking.  Nicotine replacement therapy helps deliver nicotine to your body without the negative effects and risks of smoking. Nicotine replacement therapy includes nicotine gum, lozenges, inhalers, nasal sprays, and skin patches. Some may be available over-the-counter and others require a prescription.  Antidepressant medicine helps people abstain from smoking, but how this works is unknown. This medicine is available by prescription.  Nicotinic receptor partial agonist medicine simulates the effect of nicotine in your brain. This medicine is available by prescription. Ask your health care provider for advice about which medicines to use and how to use them based on your health  history. Your health care provider will tell you what side effects to look out for if you choose to be on a medicine or therapy. Carefully read the information on the package. Do not use any other product containing nicotine while using a nicotine replacement product.  RELAPSE OR DIFFICULT SITUATIONS Most relapses occur within the first 3 months after quitting. Do not be discouraged if you start smoking again. Remember, most people try several times before finally quitting. You may have symptoms of withdrawal because your body is used to nicotine. You may crave cigarettes, be irritable, feel very hungry, cough often, get headaches, or have difficulty concentrating. The withdrawal symptoms are only temporary. They are strongest when you first quit, but they will go away within 10-14 days. To reduce the chances of relapse, try to:  Avoid drinking alcohol. Drinking lowers your chances of successfully quitting.  Reduce the amount of caffeine you consume. Once you quit  smoking, the amount of caffeine in your body increases and can give you symptoms, such as a rapid heartbeat, sweating, and anxiety.  Avoid smokers because they can make you want to smoke.  Do not let weight gain distract you. Many smokers will gain weight when they quit, usually less than 10 pounds. Eat a healthy diet and stay active. You can always lose the weight gained after you quit.  Find ways to improve your mood other than smoking. FOR MORE INFORMATION  www.smokefree.gov  Document Released: 12/07/2001 Document Revised: 04/29/2014 Document Reviewed: 03/23/2012 [facility] Patient Information 2015 [place], [place]. This information is not intended to replace advice given to you by your health care provider. Make sure you discuss any questions you have with your health care provider.

## 2015-06-05 NOTE — Assessment & Plan Note (Signed)
Start flonase for symptoms. No indication for antibiotic at this time. Talked to her about smoking cessation to help her symptoms and she is not interested at this time.

## 2015-06-05 NOTE — Assessment & Plan Note (Signed)
See Dr. Arty Baumgartner and MWF dialysis. Will obtain records to ensure adequate immunizations. On sensipar, phoslo, renal vitamin.

## 2015-06-05 NOTE — Assessment & Plan Note (Signed)
Small to medium. Reassured that not harmful and no indication to fix. Talked to her about increased pressure causing growth.

## 2015-06-07 ENCOUNTER — Telehealth: Payer: Self-pay | Admitting: Internal Medicine

## 2015-06-07 NOTE — Telephone Encounter (Signed)
Records received from [facility], forwarded to Primary Care for 6/13 appointment, rmf.

## 2015-06-09 ENCOUNTER — Inpatient Hospital Stay (HOSPITAL_COMMUNITY)
Admission: EM | Admit: 2015-06-09 | Discharge: 2015-06-11 | DRG: 189 | Disposition: A | Discharge status: Home / Self Care | Payer: Medicare Other | Attending: Internal Medicine | Admitting: Internal Medicine

## 2015-06-09 ENCOUNTER — Encounter (HOSPITAL_COMMUNITY): Payer: Self-pay | Admitting: *Deleted

## 2015-06-09 ENCOUNTER — Emergency Department (HOSPITAL_COMMUNITY): Payer: Medicare Other

## 2015-06-09 DIAGNOSIS — E785 Hyperlipidemia, unspecified: Secondary | ICD-10-CM | POA: Diagnosis present

## 2015-06-09 DIAGNOSIS — M1990 Unspecified osteoarthritis, unspecified site: Secondary | ICD-10-CM | POA: Diagnosis present

## 2015-06-09 DIAGNOSIS — D631 Anemia in chronic kidney disease: Secondary | ICD-10-CM | POA: Diagnosis present

## 2015-06-09 DIAGNOSIS — R0600 Dyspnea, unspecified: Secondary | ICD-10-CM | POA: Diagnosis present

## 2015-06-09 DIAGNOSIS — K219 Gastro-esophageal reflux disease without esophagitis: Secondary | ICD-10-CM | POA: Diagnosis present

## 2015-06-09 DIAGNOSIS — J810 Acute pulmonary edema: Secondary | ICD-10-CM | POA: Diagnosis present

## 2015-06-09 DIAGNOSIS — Z90710 Acquired absence of both cervix and uterus: Secondary | ICD-10-CM

## 2015-06-09 DIAGNOSIS — Z888 Allergy status to other drugs, medicaments and biological substances status: Secondary | ICD-10-CM

## 2015-06-09 DIAGNOSIS — N186 End stage renal disease: Secondary | ICD-10-CM

## 2015-06-09 DIAGNOSIS — J449 Chronic obstructive pulmonary disease, unspecified: Secondary | ICD-10-CM | POA: Diagnosis present

## 2015-06-09 DIAGNOSIS — I10 Essential (primary) hypertension: Secondary | ICD-10-CM | POA: Diagnosis not present

## 2015-06-09 DIAGNOSIS — J181 Lobar pneumonia, unspecified organism: Secondary | ICD-10-CM | POA: Diagnosis present

## 2015-06-09 DIAGNOSIS — Z720 Tobacco use: Secondary | ICD-10-CM | POA: Diagnosis present

## 2015-06-09 DIAGNOSIS — E1122 Type 2 diabetes mellitus with diabetic chronic kidney disease: Secondary | ICD-10-CM | POA: Diagnosis present

## 2015-06-09 DIAGNOSIS — E8770 Fluid overload, unspecified: Secondary | ICD-10-CM | POA: Diagnosis present

## 2015-06-09 DIAGNOSIS — Z6831 Body mass index (BMI) 31.0-31.9, adult: Secondary | ICD-10-CM | POA: Diagnosis not present

## 2015-06-09 DIAGNOSIS — D72829 Elevated white blood cell count, unspecified: Secondary | ICD-10-CM | POA: Diagnosis present

## 2015-06-09 DIAGNOSIS — N2581 Secondary hyperparathyroidism of renal origin: Secondary | ICD-10-CM | POA: Diagnosis present

## 2015-06-09 DIAGNOSIS — R0602 Shortness of breath: Secondary | ICD-10-CM

## 2015-06-09 DIAGNOSIS — R609 Edema, unspecified: Secondary | ICD-10-CM

## 2015-06-09 DIAGNOSIS — E669 Obesity, unspecified: Secondary | ICD-10-CM | POA: Diagnosis present

## 2015-06-09 DIAGNOSIS — F17210 Nicotine dependence, cigarettes, uncomplicated: Secondary | ICD-10-CM | POA: Diagnosis present

## 2015-06-09 DIAGNOSIS — D638 Anemia in other chronic diseases classified elsewhere: Secondary | ICD-10-CM | POA: Diagnosis present

## 2015-06-09 DIAGNOSIS — Z79899 Other long term (current) drug therapy: Secondary | ICD-10-CM | POA: Diagnosis not present

## 2015-06-09 DIAGNOSIS — J9601 Acute respiratory failure with hypoxia: Secondary | ICD-10-CM | POA: Diagnosis present

## 2015-06-09 DIAGNOSIS — Z7729 Contact with and (suspected ) exposure to other hazardous substances: Secondary | ICD-10-CM | POA: Diagnosis present

## 2015-06-09 DIAGNOSIS — Z992 Dependence on renal dialysis: Secondary | ICD-10-CM

## 2015-06-09 DIAGNOSIS — I120 Hypertensive chronic kidney disease with stage 5 chronic kidney disease or end stage renal disease: Secondary | ICD-10-CM | POA: Diagnosis present

## 2015-06-09 DIAGNOSIS — Z791 Long term (current) use of non-steroidal anti-inflammatories (NSAID): Secondary | ICD-10-CM

## 2015-06-09 HISTORY — DX: Reserved for inherently not codable concepts without codable children: IMO0001

## 2015-06-09 HISTORY — DX: Major depressive disorder, single episode, unspecified: F32.9

## 2015-06-09 HISTORY — DX: Gastro-esophageal reflux disease without esophagitis: K21.9

## 2015-06-09 HISTORY — DX: Pneumonia, unspecified organism: J18.9

## 2015-06-09 HISTORY — DX: Depression, unspecified: F32.A

## 2015-06-09 LAB — CBC WITH DIFFERENTIAL/PLATELET
BASOS ABS: 0.1 10*3/uL (ref 0.0–0.1)
Basophils Relative: 1 % (ref 0–1)
Eosinophils Absolute: 0.2 10*3/uL (ref 0.0–0.7)
Eosinophils Relative: 2 % (ref 0–5)
HEMATOCRIT: 28 % — AB (ref 36.0–46.0)
HEMOGLOBIN: 9.3 g/dL — AB (ref 12.0–15.0)
LYMPHS ABS: 1.6 10*3/uL (ref 0.7–4.0)
LYMPHS PCT: 15 % (ref 12–46)
MCH: 30 pg (ref 26.0–34.0)
MCHC: 33.2 g/dL (ref 30.0–36.0)
MCV: 90.3 fL (ref 78.0–100.0)
MONO ABS: 0.6 10*3/uL (ref 0.1–1.0)
Monocytes Relative: 5 % (ref 3–12)
Neutro Abs: 8.3 10*3/uL — ABNORMAL HIGH (ref 1.7–7.7)
Neutrophils Relative %: 77 % (ref 43–77)
Platelets: 272 10*3/uL (ref 150–400)
RBC: 3.1 MIL/uL — AB (ref 3.87–5.11)
RDW: 16.8 % — ABNORMAL HIGH (ref 11.5–15.5)
WBC: 10.8 10*3/uL — AB (ref 4.0–10.5)

## 2015-06-09 LAB — COMPREHENSIVE METABOLIC PANEL
ALT: 34 U/L (ref 14–54)
ANION GAP: 15 (ref 5–15)
AST: 33 U/L (ref 15–41)
Albumin: 3.1 g/dL — ABNORMAL LOW (ref 3.5–5.0)
Alkaline Phosphatase: 125 U/L (ref 38–126)
BUN: 45 mg/dL — ABNORMAL HIGH (ref 6–20)
CHLORIDE: 101 mmol/L (ref 101–111)
CO2: 22 mmol/L (ref 22–32)
Calcium: 10 mg/dL (ref 8.9–10.3)
Creatinine, Ser: 12.76 mg/dL — ABNORMAL HIGH (ref 0.44–1.00)
GFR calc non Af Amer: 3 mL/min — ABNORMAL LOW (ref >60)
GFR, EST AFRICAN AMERICAN: 3 mL/min — AB (ref >60)
Glucose, Bld: 94 mg/dL (ref 65–99)
POTASSIUM: 4.1 mmol/L (ref 3.5–5.1)
Sodium: 138 mmol/L (ref 135–145)
TOTAL PROTEIN: 6.4 g/dL — AB (ref 6.5–8.1)
Total Bilirubin: 0.6 mg/dL (ref 0.3–1.2)

## 2015-06-09 LAB — TSH: TSH: 1.3 u[IU]/mL (ref 0.350–4.500)

## 2015-06-09 LAB — I-STAT CG4 LACTIC ACID, ED
Lactic Acid, Venous: 0.85 mmol/L (ref 0.5–2.0)
Lactic Acid, Venous: 1.92 mmol/L (ref 0.5–2.0)

## 2015-06-09 MED ORDER — PENTAFLUOROPROP-TETRAFLUOROETH EX AERO
1.0000 | INHALATION_SPRAY | CUTANEOUS | Status: DC | PRN
Start: 2015-06-09 — End: 2015-06-09

## 2015-06-09 MED ORDER — LIDOCAINE-PRILOCAINE 2.5-2.5 % EX CREA: 1.0000 "application " | TOPICAL_CREAM | CUTANEOUS | Status: DC | PRN

## 2015-06-09 MED ORDER — ALBUTEROL SULFATE (2.5 MG/3ML) 0.083% IN NEBU
5.0000 mg | INHALATION_SOLUTION | Freq: Once | RESPIRATORY_TRACT | Status: AC
  Administered 2015-06-09: 5 mg via RESPIRATORY_TRACT
  Filled 2015-06-09: qty 6

## 2015-06-09 MED ORDER — DEXTROSE 5 % IV SOLN
1.0000 g | Freq: Once | INTRAVENOUS | Status: AC
  Administered 2015-06-09: 1 g via INTRAVENOUS
  Filled 2015-06-09: qty 10

## 2015-06-09 MED ORDER — NEPRO/CARBSTEADY PO LIQD: 237.0000 mL | ORAL | Status: DC | PRN

## 2015-06-09 MED ORDER — ONDANSETRON HCL 4 MG/2ML IJ SOLN: 4.0000 mg | Freq: Four times a day (QID) | INTRAMUSCULAR | Status: DC | PRN

## 2015-06-09 MED ORDER — ACETAMINOPHEN 325 MG PO TABS: 650.0000 mg | ORAL_TABLET | Freq: Four times a day (QID) | ORAL | Status: DC | PRN

## 2015-06-09 MED ORDER — DOXERCALCIFEROL 4 MCG/2ML IV SOLN
3.0000 ug | INTRAVENOUS | Status: DC
  Administered 2015-06-09 – 2015-06-11 (×2): 3 ug via INTRAVENOUS
  Filled 2015-06-09 (×2): qty 2

## 2015-06-09 MED ORDER — SODIUM CHLORIDE 0.9 % IV SOLN
100.0000 mL | INTRAVENOUS | Status: DC | PRN
Start: 2015-06-09 — End: 2015-06-09

## 2015-06-09 MED ORDER — LOSARTAN POTASSIUM 50 MG PO TABS
100.0000 mg | ORAL_TABLET | Freq: Every day | ORAL | Status: DC
  Administered 2015-06-10 – 2015-06-11 (×2): 100 mg via ORAL
  Filled 2015-06-09 (×3): qty 2

## 2015-06-09 MED ORDER — DARBEPOETIN ALFA 25 MCG/0.42ML IJ SOSY
25.0000 ug | PREFILLED_SYRINGE | INTRAMUSCULAR | Status: DC
  Administered 2015-06-11: 25 ug via INTRAVENOUS
  Filled 2015-06-09: qty 0.42

## 2015-06-09 MED ORDER — GABAPENTIN 300 MG PO CAPS
300.0000 mg | ORAL_CAPSULE | Freq: Every day | ORAL | Status: DC
  Administered 2015-06-09 – 2015-06-11 (×3): 300 mg via ORAL
  Filled 2015-06-09 (×3): qty 1

## 2015-06-09 MED ORDER — IPRATROPIUM-ALBUTEROL 0.5-2.5 (3) MG/3ML IN SOLN
3.0000 mL | RESPIRATORY_TRACT | Status: DC | PRN
  Administered 2015-06-10: 3 mL via RESPIRATORY_TRACT
  Filled 2015-06-09: qty 3

## 2015-06-09 MED ORDER — AMLODIPINE BESYLATE 10 MG PO TABS
10.0000 mg | ORAL_TABLET | Freq: Every day | ORAL | Status: DC
  Administered 2015-06-10 – 2015-06-11 (×2): 10 mg via ORAL
  Filled 2015-06-09 (×3): qty 1

## 2015-06-09 MED ORDER — ALBUTEROL SULFATE (2.5 MG/3ML) 0.083% IN NEBU
INHALATION_SOLUTION | RESPIRATORY_TRACT | Status: AC
  Filled 2015-06-09: qty 3

## 2015-06-09 MED ORDER — ALTEPLASE 2 MG IJ SOLR: 2.0000 mg | Freq: Once | INTRAMUSCULAR | Status: DC | PRN

## 2015-06-09 MED ORDER — NEPHRO-VITE 0.8 MG PO TABS
1.0000 | ORAL_TABLET | Freq: Every day | ORAL | Status: DC
  Filled 2015-06-09: qty 1

## 2015-06-09 MED ORDER — LANTHANUM CARBONATE 500 MG PO CHEW
2000.0000 mg | CHEWABLE_TABLET | Freq: Three times a day (TID) | ORAL | Status: DC
  Administered 2015-06-09 – 2015-06-11 (×4): 2000 mg via ORAL
  Filled 2015-06-09 (×8): qty 4

## 2015-06-09 MED ORDER — IPRATROPIUM BROMIDE 0.02 % IN SOLN
0.5000 mg | Freq: Once | RESPIRATORY_TRACT | Status: AC
  Administered 2015-06-09: 0.5 mg via RESPIRATORY_TRACT

## 2015-06-09 MED ORDER — ATROPINE SULFATE 0.1 MG/ML IJ SOLN
INTRAMUSCULAR | Status: AC
  Filled 2015-06-09: qty 10

## 2015-06-09 MED ORDER — DEXTROSE 5 % IV SOLN
1.0000 g | INTRAVENOUS | Status: DC
  Administered 2015-06-09: 1 g via INTRAVENOUS
  Filled 2015-06-09: qty 10

## 2015-06-09 MED ORDER — ALBUTEROL SULFATE (2.5 MG/3ML) 0.083% IN NEBU
5.0000 mg | INHALATION_SOLUTION | RESPIRATORY_TRACT | Status: DC
  Administered 2015-06-09: 2.5 mg via RESPIRATORY_TRACT
  Filled 2015-06-09: qty 6

## 2015-06-09 MED ORDER — AMLODIPINE BESYLATE 10 MG PO TABS
10.0000 mg | ORAL_TABLET | Freq: Every day | ORAL | Status: DC
  Filled 2015-06-09: qty 1

## 2015-06-09 MED ORDER — HEPARIN SODIUM (PORCINE) 1000 UNIT/ML DIALYSIS: 20.0000 [IU]/kg | INTRAMUSCULAR | Status: DC | PRN

## 2015-06-09 MED ORDER — IPRATROPIUM BROMIDE 0.02 % IN SOLN
0.5000 mg | Freq: Once | RESPIRATORY_TRACT | Status: AC
  Administered 2015-06-09: 0.5 mg via RESPIRATORY_TRACT
  Filled 2015-06-09: qty 2.5

## 2015-06-09 MED ORDER — CINACALCET HCL 30 MG PO TABS
60.0000 mg | ORAL_TABLET | Freq: Every day | ORAL | Status: DC
  Administered 2015-06-10: 60 mg via ORAL
  Filled 2015-06-09 (×3): qty 2

## 2015-06-09 MED ORDER — HEPARIN SODIUM (PORCINE) 1000 UNIT/ML DIALYSIS: 1000.0000 [IU] | INTRAMUSCULAR | Status: DC | PRN

## 2015-06-09 MED ORDER — ACETAMINOPHEN 650 MG RE SUPP: 650.0000 mg | Freq: Four times a day (QID) | RECTAL | Status: DC | PRN

## 2015-06-09 MED ORDER — IPRATROPIUM BROMIDE 0.02 % IN SOLN
0.5000 mg | RESPIRATORY_TRACT | Status: DC
  Administered 2015-06-09: 0.5 mg via RESPIRATORY_TRACT
  Filled 2015-06-09: qty 2.5

## 2015-06-09 MED ORDER — SODIUM CHLORIDE 0.9 % IV SOLN
INTRAVENOUS | Status: DC
  Administered 2015-06-09: 150 mL via INTRAVENOUS

## 2015-06-09 MED ORDER — CETYLPYRIDINIUM CHLORIDE 0.05 % MT LIQD
7.0000 mL | Freq: Two times a day (BID) | OROMUCOSAL | Status: DC
  Administered 2015-06-09 – 2015-06-11 (×4): 7 mL via OROMUCOSAL

## 2015-06-09 MED ORDER — DOXERCALCIFEROL 4 MCG/2ML IV SOLN
INTRAVENOUS | Status: AC
  Administered 2015-06-09: 3 ug via INTRAVENOUS
  Filled 2015-06-09: qty 2

## 2015-06-09 MED ORDER — ONDANSETRON HCL 4 MG PO TABS
4.0000 mg | ORAL_TABLET | Freq: Four times a day (QID) | ORAL | Status: DC | PRN
  Administered 2015-06-10 – 2015-06-11 (×2): 4 mg via ORAL
  Filled 2015-06-09 (×2): qty 1

## 2015-06-09 MED ORDER — LIDOCAINE HCL (PF) 1 % IJ SOLN
5.0000 mL | INTRAMUSCULAR | Status: DC | PRN
Start: 2015-06-09 — End: 2015-06-09

## 2015-06-09 MED ORDER — ZOLPIDEM TARTRATE 5 MG PO TABS
5.0000 mg | ORAL_TABLET | Freq: Once | ORAL | Status: AC
  Administered 2015-06-09: 5 mg via ORAL
  Filled 2015-06-09: qty 1

## 2015-06-09 MED ORDER — SODIUM CHLORIDE 0.9 % IV SOLN
62.5000 mg | INTRAVENOUS | Status: DC
  Administered 2015-06-11: 62.5 mg via INTRAVENOUS
  Filled 2015-06-09: qty 5

## 2015-06-09 MED ORDER — SODIUM CHLORIDE 0.9 % IJ SOLN
3.0000 mL | Freq: Two times a day (BID) | INTRAMUSCULAR | Status: DC
  Administered 2015-06-09: 10 mL via INTRAVENOUS

## 2015-06-09 MED ORDER — DEXTROSE 5 % IV SOLN
500.0000 mg | INTRAVENOUS | Status: DC
  Administered 2015-06-09: 500 mg via INTRAVENOUS
  Filled 2015-06-09 (×2): qty 500

## 2015-06-09 MED ORDER — SODIUM CHLORIDE 0.9 % IV SOLN: 100.0000 mL | INTRAVENOUS | Status: DC | PRN

## 2015-06-09 MED ORDER — RENA-VITE PO TABS
1.0000 | ORAL_TABLET | Freq: Every day | ORAL | Status: DC
Start: 2015-06-09 — End: 2015-06-11
  Administered 2015-06-09 – 2015-06-10 (×2): 1 via ORAL
  Filled 2015-06-09 (×3): qty 1

## 2015-06-09 MED ORDER — LOSARTAN POTASSIUM 50 MG PO TABS
100.0000 mg | ORAL_TABLET | Freq: Every day | ORAL | Status: DC
  Filled 2015-06-09: qty 2

## 2015-06-09 MED ORDER — PRAVASTATIN SODIUM 20 MG PO TABS
20.0000 mg | ORAL_TABLET | Freq: Every day | ORAL | Status: DC
  Administered 2015-06-10 – 2015-06-11 (×2): 20 mg via ORAL
  Filled 2015-06-09 (×2): qty 1

## 2015-06-09 MED ORDER — ALBUTEROL SULFATE (2.5 MG/3ML) 0.083% IN NEBU
5.0000 mg | INHALATION_SOLUTION | Freq: Once | RESPIRATORY_TRACT | Status: AC
  Administered 2015-06-09: 5 mg via RESPIRATORY_TRACT

## 2015-06-09 MED ORDER — DEXTROSE 5 % IV SOLN
500.0000 mg | Freq: Once | INTRAVENOUS | Status: DC
  Filled 2015-06-09: qty 500

## 2015-06-09 MED ORDER — IBUPROFEN 200 MG PO TABS
600.0000 mg | ORAL_TABLET | Freq: Four times a day (QID) | ORAL | Status: DC | PRN
  Administered 2015-06-10: 600 mg via ORAL
  Filled 2015-06-09: qty 3

## 2015-06-09 NOTE — Plan of Care (Signed)
Problem: Phase I Progression Outcomes Goal: Initial discharge plan identified Outcome: Completed/Met Date Met:  06/09/15 To return home

## 2015-06-09 NOTE — ED Notes (Signed)
Pt. Is a dialysis pt. MWF dialysis treatments. Pt. Called EMS around midnight last night c/o SOB. Pt. Was wheezing and received a breathing treatment but did not come in. Pt. Then got up for dialysis and went to dialysis still SOB and hypertensive. 200s/100s. The Dialysis MD felt the pt. Should be seen. Pt states she went over her fluid restriction due to the high temps outside. Pt. Ambulatory on arrival.

## 2015-06-09 NOTE — Progress Notes (Signed)
Utilization review completed.  

## 2015-06-09 NOTE — Progress Notes (Signed)
Pt completed HD treatment with no complications. Alert, vss. Report called to J. Sheryn Bison, Agricultural consultant. Pt returned safely to room. CCMD notified.

## 2015-06-09 NOTE — Consult Note (Signed)
[facility] Renal Consultation Note    Indication for Consultation:  Management of ESRD/hemodialysis; anemia, hypertension/volume and secondary hyperparathyroidism PCP: Dr. Vertell Novak  HPI: Kim Hodge is a 59 y.o. 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  She also has a hx of HTN, steroid induced DM, chronic bronchitis and tobacco abuse. She faithfully attends all and runs her full HD treatments on MWF at [facility]. Her UF goals range 1.5 - 3.5 and she attains her EDW   Pre and post BPS average 735 - 329 systolic range. She has no fever, chills, CP, N, V, D. She now has a productive cough.  She states she called the EMS last pm due to SOB, they gave her a breathing treatment and declined transport to the ED.  She arrived at HD this am and was very SOB requiring O2.  The staff elected to transfer her to the ED for further evaluation and treatment. She has been having frequent fumigation treatments of her apartment for prevention of bed bugs that occurred last March.  She has been sleeping on the couch since then despite treatment of her mattress and covering with plastic cover due to fear of being bitten by bedbugs again.  Overall, feels better than earlier today.  Past Medical History  Diagnosis Date  . Hypertension   . Hyperlipidemia   . Obesity   . Chronic kidney disease   . Fibrosis of uterus   . Membranoproliferative glomerulonephritis     type 1  . Anemia   . Chronic bronchitis   . Degenerative joint disease   . Hyperparathyroidism, secondary renal   . ESRD (end stage renal disease)   . NEPHROTIC SYNDROME 05/07/2010    Qualifier: Diagnosis of  By: Hollie Salk CMA, [name]    . Shortness of breath dyspnea   . Pneumonia 06/09/2015  . Diabetes mellitus     reports resolved  . Depression   . GERD (gastroesophageal reflux disease)   . Headache    Past Surgical History  Procedure Laterality Date  . Av fistula placement  01/02/10   . Abdominal hysterectomy    . Knee arthroscopy  right knee 2006   Family History  Problem Relation Age of Onset  . Cancer Mother     breast  . Stroke Father    Social History:  reports that she has been smoking Cigarettes.  She has a 10 pack-year smoking history. She has never used smokeless tobacco. She reports that she uses illicit drugs (Marijuana). She reports that she does not drink alcohol. Allergies  Allergen Reactions  . Ace Inhibitors Anaphylaxis, Shortness Of Breath and Swelling    Tongue and throat swells, cannot breathe   Prior to Admission medications   Medication Sig Start Date End Date Taking? Authorizing Provider  acetaminophen (TYLENOL) 500 MG tablet Take 1,500 mg by mouth every 6 (six) hours as needed for moderate pain or headache.   Yes Historical Provider, MD  albuterol (PROVENTIL HFA;VENTOLIN HFA) 108 (90 BASE) MCG/ACT inhaler Inhale 1-2 puffs into the lungs every 6 (six) hours as needed for wheezing or shortness of breath. 08/11/14  Yes Harden Mo, MD  amLODipine (NORVASC) 10 MG tablet Take 10 mg by mouth daily.   Yes Historical Provider, MD  Cimetidine (ACID REDUCER 200 PO) Take 1 tablet by mouth daily as needed (ACID).    Yes Historical Provider, MD  cinacalcet (SENSIPAR) 60 MG tablet Take 60 mg by mouth daily.  Yes Historical Provider, MD  folic acid-vitamin b complex-vitamin c-selenium-zinc (DIALYVITE) 3 MG TABS Take 1 tablet by mouth daily.     Yes Historical Provider, MD  gabapentin (NEURONTIN) 300 MG capsule Take 300 mg by mouth daily.   Yes Historical Provider, MD  ibuprofen (ADVIL,MOTRIN) 200 MG tablet Take 600 mg by mouth every 6 (six) hours as needed for moderate pain.   Yes Historical Provider, MD  lanthanum (FOSRENOL) 1000 MG chewable tablet Chew 2,000 mg by mouth 3 (three) times daily with meals.    Yes Historical Provider, MD  losartan (COZAAR) 100 MG tablet Take 100 mg by mouth daily.   Yes Historical Provider, MD  pravastatin (PRAVACHOL) 20 MG  tablet Take 20 mg by mouth daily.     Yes Historical Provider, MD  Pseudoephedrine-Acetaminophen (SINUS PO) Take 1 tablet by mouth daily as needed (for congestion). OTC   Yes Historical Provider, MD  fluticasone (FLONASE) 50 MCG/ACT nasal spray Place 2 sprays into both nostrils daily. Patient not taking: Reported on 06/09/2015 06/05/15   Olga Millers, MD   Current Facility-Administered Medications  Medication Dose Route Frequency Provider Last Rate Last Dose  . 0.9 %  sodium chloride infusion   Intravenous Continuous Robbie Lis, MD 10 mL/hr at 06/09/15 1245 150 mL at 06/09/15 1245  . 0.9 %  sodium chloride infusion  100 mL Intravenous PRN Alric Seton, PA-C      . 0.9 %  sodium chloride infusion  100 mL Intravenous PRN Alric Seton, PA-C      . acetaminophen (TYLENOL) tablet 650 mg  650 mg Oral Q6H PRN Robbie Lis, MD       Or  . acetaminophen (TYLENOL) suppository 650 mg  650 mg Rectal Q6H PRN Robbie Lis, MD      . alteplase (CATHFLO ACTIVASE) injection 2 mg  2 mg Intracatheter Once PRN Alric Seton, PA-C      . amLODipine (NORVASC) tablet 10 mg  10 mg Oral Daily Robbie Lis, MD   10 mg at 06/09/15 1347  . azithromycin (ZITHROMAX) 500 mg in dextrose 5 % 250 mL IVPB  500 mg Intravenous Q24H Robbie Lis, MD      . cefTRIAXone (ROCEPHIN) 1 g in dextrose 5 % 50 mL IVPB  1 g Intravenous Q24H Robbie Lis, MD   1 g at 06/09/15 1015  . [START ON 06/10/2015] cinacalcet (SENSIPAR) tablet 60 mg  60 mg Oral Q breakfast Robbie Lis, MD      . Derrill Memo ON 06/11/2015] Darbepoetin Alfa (ARANESP) injection 25 mcg  25 mcg Intravenous Q Wed-HD Alric Seton, PA-C      . doxercalciferol (HECTOROL) injection 3 mcg  3 mcg Intravenous Q M,W,F-HD Alric Seton, PA-C      . feeding supplement (NEPRO CARB STEADY) liquid 237 mL  237 mL Oral PRN Alric Seton, PA-C      . Derrill Memo ON 06/11/2015] ferric gluconate (NULECIT) 62.5 mg in sodium chloride 0.9 % 100 mL IVPB  62.5 mg Intravenous Q Wed-HD  Alric Seton, PA-C      . gabapentin (NEURONTIN) capsule 300 mg  300 mg Oral Daily Robbie Lis, MD   300 mg at 06/09/15 1348  . heparin injection 1,000 Units  1,000 Units Dialysis PRN Alric Seton, PA-C      . heparin injection 1,600 Units  20 Units/kg Dialysis PRN Alric Seton, PA-C      . ibuprofen (ADVIL,MOTRIN) tablet 600 mg  600 mg  Oral Q6H PRN Robbie Lis, MD      . ipratropium-albuterol (DUONEB) 0.5-2.5 (3) MG/3ML nebulizer solution 3 mL  3 mL Nebulization Q4H PRN Robbie Lis, MD      . lidocaine (PF) (XYLOCAINE) 1 % injection 5 mL  5 mL Intradermal PRN Alric Seton, PA-C      . lidocaine-prilocaine (EMLA) cream 1 application  1 application Topical PRN Alric Seton, PA-C      . losartan (COZAAR) tablet 100 mg  100 mg Oral Daily Robbie Lis, MD   100 mg at 06/09/15 1347  . multivitamin (RENA-VIT) tablet 1 tablet  1 tablet Oral QHS Robbie Lis, MD      . ondansetron [facility]) tablet 4 mg  4 mg Oral Q6H PRN Robbie Lis, MD       Or  . ondansetron [facility]) injection 4 mg  4 mg Intravenous Q6H PRN Robbie Lis, MD      . pentafluoroprop-tetrafluoroeth [facility]) aerosol 1 application  1 application Topical PRN Alric Seton, PA-C      . Derrill Memo ON 06/10/2015] pravastatin (PRAVACHOL) tablet 20 mg  20 mg Oral Daily Robbie Lis, MD      . sodium chloride 0.9 % injection 3 mL  3 mL Intravenous Q12H Robbie Lis, MD   3 mL at 06/09/15 1246   Labs: Basic Metabolic Panel:  Recent Labs Lab 06/09/15 0706  NA 138  K 4.1  CL 101  CO2 22  GLUCOSE 94  BUN 45*  CREATININE 12.76*  CALCIUM 10.0   Liver Function Tests:  Recent Labs Lab 06/09/15 0706  AST 33  ALT 34  ALKPHOS 125  BILITOT 0.6  PROT 6.4*  ALBUMIN 3.1*  CBC:  Recent Labs Lab 06/09/15 0706  WBC 10.8*  NEUTROABS 8.3*  HGB 9.3*  HCT 28.0*  MCV 90.3  PLT 272  Studies/Results: Dg Chest 2 View  06/09/2015   CLINICAL DATA:  Shortness of breath.  EXAM: CHEST  2 VIEW  COMPARISON:  12/25/2014   FINDINGS: There is symmetric interstitial coarsening with Kerley lines and fissural thickening. Borderline cardiomegaly, stable from prior. Mild aortic tortuosity. No acute osseous findings.  IMPRESSION: Findings consistent with interstitial pulmonary edema. If there are clinical signs of infection, note that bronchitis/atypical pneumonia can also have this appearance.   Electronically Signed   By: Monte Fantasia M.D.   On: 06/09/2015 07:36    ROS: As per HPI otherwise negative.  Physical Exam: Filed Vitals:   06/09/15 1109 06/09/15 1231 06/09/15 1409 06/09/15 1420  BP:  163/98 165/80   Pulse:  90 81   Temp: 97.9 F (36.6 C) 98.4 F (36.9 C) 98.4 F (36.9 C)   TempSrc:  Oral Oral   Resp:  16 16   SpO2:  100% 100% 100%     General: Well developed, well nourished, in no acute distress. Head: Normocephalic, atraumatic, sclera non-icteric, mucus membranes are moist Neck: Supple. Neck veins full Lungs: crackles at bases fairly good expansion Heart: RRR with S1 S2. No murmurs, rubs, or gallops appreciated. Abdomen: Soft, non-tender, non-distended with normoactive bowel sounds. No rebound/guarding. No obvious abdominal masses. M-S:  Strength and tone appear normal for age. Lower extremities: without edema or ischemic changes, no open wounds  Neuro: Alert and oriented X 3. Moves all extremities spontaneously. Psych:  Responds to questions appropriately with a normal affect. Dialysis Access:  AVGG left upper + bruit, several light scarred areas  Dialysis Orders: [facility]  MWF 3.75 hours 160 350/A 1.5 EDW 79.5 2K 2.25 Ca profile 4 36 degrees Heparin 4000 Hectorol 3 Mircera 50 q 4 weeks - last 5/18 Recent labs; Hgb 10.3 stable, 26% sat May ferritin 865 April -not on venofer, iPTH 456   Assessment/Plan: 1.  SOB - CXR showed interstitial edema, afebrile, ^ WBC ? PNA - being treated with Rocephin/Zithromax for possible PNA, cultures drawn. Repeat CXR in am, if still volume, will need an extra  HD for UF on Tuesday. Fumigation treatments of her apartment is likely aggrevating symptoms 2.  ESRD -  MWF - HD today - may need to have EDW lowered 3.  Hypertension/volume  - no weights - get standing weight pre HD BP up, sats good - decrease volume - may need lower edw; continue losartan and norvasc- change to HS dosing 4.  Anemia  - Hgb stable at 10.3 as an outpt - change to ARanesp 25 start Wed.  Start weekly IV Fe 5.  Metabolic bone disease -  Ca/P controlled as outpt - continue Hectorol/sensipar/resume binders- takes 2 fosrenol ac 6.  Nutrition - allow regular diet for now with appropriate selections by pt - add fluid restriction 7. Tobacco abuse - hx chronic bronchitis/COPD - on albuterol.   Myriam Jacobson, PA-C [place] [phone] 06/09/2015, 2:33 PM   Pt seen, examined and agree w A/P as above. Clinically has URI and may have PNA.  CXR looks more like early CHF, so not sure at this time which is responsible more for her SOB. Plan HD today, getting Abx, , reassess p vol removal. Will follow.  Kelly Splinter MD pager [phone]    cell [phone] 06/09/2015, 5:55 PM

## 2015-06-09 NOTE — Progress Notes (Signed)
Kim Hodge [id number] Admitted to 5W 38: 06/09/2015 7:29 PM Attending Provider: Robbie Lis, MD    Kim Hodge is a 59 y.o. female patient admitted from ED awake, alert  & orientated  X 3,  Full Code, VSS - 98.4, 16, 90, 163/98 , O2  2 L nasal cannular, no c/o shortness of breath, no c/o chest pain, no distress noted. Tele # 19 placed and pt is currently running:normal sinus rhythm.   IV site WDL:  wrist right, condition patent and no redness with a transparent dsg that's clean dry and intact.  Allergies:   Allergies  Allergen Reactions  . Ace Inhibitors Anaphylaxis, Shortness Of Breath and Swelling    Tongue and throat swells, cannot breathe     Past Medical History  Diagnosis Date  . Hypertension   . Hyperlipidemia   . Obesity   . Chronic kidney disease   . Fibrosis of uterus   . Membranoproliferative glomerulonephritis     type 1  . Anemia   . Chronic bronchitis   . Degenerative joint disease   . Hyperparathyroidism, secondary renal   . ESRD (end stage renal disease)   . NEPHROTIC SYNDROME 05/07/2010    Qualifier: Diagnosis of  By: Hollie Salk CMA, [name]    . Shortness of breath dyspnea   . Pneumonia 06/09/2015  . Diabetes mellitus     reports resolved  . Depression   . GERD (gastroesophageal reflux disease)   . Headache     History:  obtained from the patient by admission nurse.  Pt orientation to unit, room and routine. Information packet given to patient/family and safety video watched.  Admission INP armband ID verified with patient/family, and in place. SR up x 2, fall risk assessment complete with Patient and family verbalizing understanding of risks associated with falls. Pt verbalizes an understanding of how to use the call bell and to call for help before getting out of bed.  Skin, clean-dry- intact without evidence of bruising, or skin tears.   No evidence of skin break down noted on exam.    Will cont to monitor and assist as needed.  Lindalou Hose, RN 06/09/2015 7:29 PM

## 2015-06-09 NOTE — ED Notes (Signed)
Pt. States she doesn't make much urine, can sometimes go a few days without making urine

## 2015-06-09 NOTE — H&P (Signed)
[facility] History and Physical  KEYONNI PERCIVAL HYI:[id number] DOB: November 17, 1956 DOA: 06/09/2015  Referring physician: ER physician: Dr. Alfonzo Beers  PCP: Olga Millers, MD  Chief Complaint: shortness of breath   HPI:  59 year old female with past medical history significant for end-stage renal disease on hemodialysis (MWF), has completed hemodialysis on Friday, history of dyslipidemia, hypertension, smoking. Patient presented to [facility] ED for evaluation of ongoing shortness of breath despite having hemodialysis on Friday for full session. Patient reports no associated cough, fevers or chills. She was just seen on 06/05/2015 by her primary care physician. Patient reports no abdominal pain, nausea or vomiting. No lightheadedness or dizziness. No chest pain. No reports of leg swelling. No diarrhea or constipation. No urinary complaints.  In ED, patient was hemodynamically stable, afebrile. Blood work was significant for leukocytosis of 10.8, hemoglobin 9.3 and creatinine of 12.76 otherwise unremarkable. Chest x-ray showed findings consistent with interstitial pulmonary edema and possibly infection, bronchitis/atypical pneumonia. Patient was started on azithromycin and Rocephin empirically in ED. She was admitted for further evaluation of shortness of breath.   Assessment & Plan    Principal Problem:   Lobar pneumonia due to unspecified organism / Leukocytosis  - Patient presented with shortness of breath but she is not hypoxic. - Possibly secondary to pneumonia versus interstitial pulmonary edema - Respiratory status is stable at this time, patient is saturating above 95% with nasal cannula oxygen support - Started empiric azithromycin and Rocephin.  Follow-up blood culture results, strep pneumonia and legionella results.   Active Problems:   Hyperlipidemia - Resume Pravachol 20 mg daily    Essential hypertension - Resume losartan and Norvasc    Tobacco abuse -  Counseled on cessation     Anemia of chronic disease - Secondary to end-stage renal disease.  - Hemoglobin is 9.3   DVT prophylaxis:  - SCD's bilaterally   Radiological Exams on Admission: Dg Chest 2 View 06/09/2015  Findings consistent with interstitial pulmonary edema. If there are clinical signs of infection, note that bronchitis/atypical pneumonia can also have this appearance.   Electronically Signed   By: Monte Fantasia M.D.   On: 06/09/2015 07:36    EKG: I have personally reviewed EKG. EKG shows sinus rhythm with some PVC's  Code Status: Full Family Communication: Plan of care discussed with the patient  Disposition Plan: Admit for further evaluation, telemetry   DEVINE, ALMA, MD  [facility] Pager [phone]  Time spent in minutes: 75 minutes  Review of Systems:  Constitutional: Negative for fever, chills and malaise/fatigue. Negative for diaphoresis.  HENT: Negative for hearing loss, ear pain, nosebleeds, congestion, sore throat, neck pain, tinnitus and ear discharge.   Eyes: Negative for blurred vision, double vision, photophobia, pain, discharge and redness.  Respiratory: per HPI.   Cardiovascular: Negative for chest pain, palpitations, orthopnea, claudication and leg swelling.  Gastrointestinal: Negative for nausea, vomiting and abdominal pain. Negative for heartburn, constipation, blood in stool and melena.  Genitourinary: Negative for dysuria, urgency, frequency, hematuria and flank pain.  Musculoskeletal: Negative for myalgias, back pain, joint pain and falls.  Skin: Negative for itching and rash.  Neurological: Negative for dizziness and weakness. Negative for tingling, tremors, sensory change, speech change, focal weakness, loss of consciousness and headaches.  Endo/Heme/Allergies: Negative for environmental allergies and polydipsia. Does not bruise/bleed easily.  Psychiatric/Behavioral: Negative for suicidal ideas. The patient is not nervous/anxious.       Past Medical History  Diagnosis Date  . Hypertension   .  Hyperlipidemia   . Obesity   . Chronic kidney disease   . Fibrosis of uterus   . Membranoproliferative glomerulonephritis     type 1  . Diabetes mellitus     reports resolved  . Anemia   . Chronic bronchitis   . Degenerative joint disease   . Hyperparathyroidism, secondary renal   . ESRD (end stage renal disease)   . NEPHROTIC SYNDROME 05/07/2010    Qualifier: Diagnosis of  By: Hollie Salk CMA, [name]     Past Surgical History  Procedure Laterality Date  . Av fistula placement  01/02/10  . Abdominal hysterectomy    . Knee arthroscopy  right knee 2006   Social History:  reports that she has been smoking Cigarettes.  She has a 10 pack-year smoking history. She has never used smokeless tobacco. She reports that she uses illicit drugs (Marijuana). She reports that she does not drink alcohol.  Allergies  Allergen Reactions  . Ace Inhibitors Anaphylaxis, Shortness Of Breath and Swelling    Tongue and throat swells, cannot breathe    Family History:  Family History  Problem Relation Age of Onset  . Cancer Mother     breast  . Stroke Father      Prior to Admission medications   Medication Sig Start Date End Date Taking? Authorizing Provider  acetaminophen (TYLENOL) 500 MG tablet Take 1,500 mg by mouth every 6 (six) hours as needed for moderate pain or headache.   Yes Historical Provider, MD  albuterol (PROVENTIL HFA;VENTOLIN HFA) 108 (90 BASE) MCG/ACT inhaler Inhale 1-2 puffs into the lungs every 6 (six) hours as needed for wheezing or shortness of breath. 08/11/14  Yes Harden Mo, MD  amLODipine (NORVASC) 10 MG tablet Take 10 mg by mouth daily.   Yes Historical Provider, MD  cinacalcet (SENSIPAR) 60 MG tablet Take 60 mg by mouth daily.   Yes Historical Provider, MD  folic acid-vitamin b complex-vitamin c-selenium-zinc (DIALYVITE) 3 MG TABS Take 1 tablet by mouth daily.     Yes Historical Provider, MD  gabapentin  (NEURONTIN) 300 MG capsule Take 300 mg by mouth daily.   Yes Historical Provider, MD  ibuprofen (ADVIL,MOTRIN) 200 MG tablet Take 600 mg by mouth every 6 (six) hours as needed for moderate pain.   Yes Historical Provider, MD  lanthanum (FOSRENOL) 1000 MG chewable tablet Chew 2,000 mg by mouth 3 (three) times daily with meals.    Yes Historical Provider, MD  losartan (COZAAR) 100 MG tablet Take 100 mg by mouth daily.   Yes Historical Provider, MD  pravastatin (PRAVACHOL) 20 MG tablet Take 20 mg by mouth daily.     Yes Historical Provider, MD   Physical Exam: Filed Vitals:   06/09/15 0867 06/09/15 0634 06/09/15 0635 06/09/15 0815  BP:  156/88  153/79  Pulse:   92 91  Resp:  12 23 14   SpO2: 98%  96% 100%    Physical Exam  Constitutional: Appears well-developed and well-nourished. No distress.  HENT: Normocephalic. No tonsillar erythema or exudates Eyes: Conjunctivae are normal. No scleral icterus.  Neck: Normal ROM. Neck supple. No JVD. No tracheal deviation. No thyromegaly.  CVS: RRR, S1/S2 appreciated   Pulmonary: Effort and breath sounds normal, no stridor, rhonchi, wheezes, rales.  Abdominal: Soft. BS +,  no distension, tenderness, rebound or guarding.  Musculoskeletal: Normal range of motion. No edema and no tenderness.  Lymphadenopathy: No lymphadenopathy noted, cervical, inguinal. Neuro: Alert. Normal reflexes, muscle tone coordination. No focal neurologic deficits. Skin:  Skin is warm and dry. No rash noted.  No erythema. No pallor.  Psychiatric: Normal mood and affect. Behavior, judgment, thought content normal.   Labs on Admission:  Basic Metabolic Panel:  Recent Labs Lab 06/09/15 0706  NA 138  K 4.1  CL 101  CO2 22  GLUCOSE 94  BUN 45*  CREATININE 12.76*  CALCIUM 10.0   Liver Function Tests:  Recent Labs Lab 06/09/15 0706  AST 33  ALT 34  ALKPHOS 125  BILITOT 0.6  PROT 6.4*  ALBUMIN 3.1*   No results for input(s): LIPASE, AMYLASE in the last 168  hours. No results for input(s): AMMONIA in the last 168 hours. CBC:  Recent Labs Lab 06/09/15 0706  WBC 10.8*  NEUTROABS 8.3*  HGB 9.3*  HCT 28.0*  MCV 90.3  PLT 272   Cardiac Enzymes: No results for input(s): CKTOTAL, CKMB, CKMBINDEX, TROPONINI in the last 168 hours. BNP: Invalid input(s): POCBNP CBG: No results for input(s): GLUCAP in the last 168 hours.  If 7PM-7AM, please contact night-coverage [email or web address] Password [email or web address] 06/09/2015, 10:15 AM

## 2015-06-09 NOTE — ED Provider Notes (Addendum)
CSN: [id number]     Arrival date & time 06/09/15  4128 History   First MD Initiated Contact with Patient 06/09/15 [id number]     Chief Complaint  Patient presents with  . Shortness of Breath     (Consider location/radiation/quality/duration/timing/severity/associated sxs/prior Treatment) HPI    PCP: Olga Millers, MD Blood pressure 153/79, pulse 91, resp. rate 14, SpO2 100 %.  Kim Hodge is a 59 y.o.female with a significant PMH of hyperlipidemia, hypertension, obesity, chronic kidney disease, fibrosis, diabetes, anemia, bronchitis, hyperparathyroidism, end-stage renal disease presents to the ER with complaints of shortness of breath that has been progressively worsening over the past few days. EMS was called out to her house this morning for SOB, they gave her a breathing treatment and she improved so she was not brought to the ED. She went to dialysis and develops SOB again. They determined that her weight was not  elevated and it was not felt that her SOB was due to fluid overload and she was sent to the ER with a B of 200s/100s. Here in the ED her BP has been 156/88. She is concerned that she may have pneumonia or another infection. She has not had fevers or chills but has felt weak. The patient denies having any symptoms of nausea, vomiting, lower extremity swelling, headaches, syncope, abdominal swelling, CP.    Past Medical History  Diagnosis Date  . Hypertension   . Hyperlipidemia   . Obesity   . Chronic kidney disease   . Fibrosis of uterus   . Membranoproliferative glomerulonephritis     type 1  . Diabetes mellitus     reports resolved  . Anemia   . Chronic bronchitis   . Degenerative joint disease   . Hyperparathyroidism, secondary renal   . ESRD (end stage renal disease)   . NEPHROTIC SYNDROME 05/07/2010    Qualifier: Diagnosis of  By: Hollie Salk CMA, [name]     Past Surgical History  Procedure Laterality Date  . Av fistula placement  01/02/10  . Abdominal  hysterectomy    . Knee arthroscopy  right knee 2006   Family History  Problem Relation Age of Onset  . Cancer Mother     breast  . Stroke Father    History  Substance Use Topics  . Smoking status: Current Some Day Smoker -- 0.25 packs/day for 40 years    Types: Cigarettes  . Smokeless tobacco: Never Used  . Alcohol Use: No   OB History    No data available     Review of Systems  10 Systems reviewed and are negative for acute change except as noted in the HPI.     Allergies  Ace inhibitors  Home Medications   Prior to Admission medications   Medication Sig Start Date End Date Taking? Authorizing Provider  acetaminophen (TYLENOL) 500 MG tablet Take 1,500 mg by mouth every 6 (six) hours as needed for moderate pain or headache.   Yes Historical Provider, MD  albuterol (PROVENTIL HFA;VENTOLIN HFA) 108 (90 BASE) MCG/ACT inhaler Inhale 1-2 puffs into the lungs every 6 (six) hours as needed for wheezing or shortness of breath. 08/11/14  Yes Harden Mo, MD  amLODipine (NORVASC) 10 MG tablet Take 10 mg by mouth daily.   Yes Historical Provider, MD  Cimetidine (ACID REDUCER 200 PO) Take 1 tablet by mouth daily as needed (ACID).    Yes Historical Provider, MD  cinacalcet (SENSIPAR) 60 MG tablet Take 60 mg by mouth daily.  Yes Historical Provider, MD  folic acid-vitamin b complex-vitamin c-selenium-zinc (DIALYVITE) 3 MG TABS Take 1 tablet by mouth daily.     Yes Historical Provider, MD  gabapentin (NEURONTIN) 300 MG capsule Take 300 mg by mouth daily.   Yes Historical Provider, MD  ibuprofen (ADVIL,MOTRIN) 200 MG tablet Take 600 mg by mouth every 6 (six) hours as needed for moderate pain.   Yes Historical Provider, MD  lanthanum (FOSRENOL) 1000 MG chewable tablet Chew 2,000 mg by mouth 3 (three) times daily with meals.    Yes Historical Provider, MD  losartan (COZAAR) 100 MG tablet Take 100 mg by mouth daily.   Yes Historical Provider, MD  pravastatin (PRAVACHOL) 20 MG tablet Take  20 mg by mouth daily.     Yes Historical Provider, MD  Pseudoephedrine-Acetaminophen (SINUS PO) Take 1 tablet by mouth daily as needed (for congestion). OTC   Yes Historical Provider, MD  fluticasone (FLONASE) 50 MCG/ACT nasal spray Place 2 sprays into both nostrils daily. Patient not taking: Reported on 06/09/2015 06/05/15   Olga Millers, MD   BP 153/79 mmHg  Pulse 91  Resp 14  SpO2 100% Physical Exam  Constitutional: She appears well-developed and well-nourished. No distress.  HENT:  Head: Normocephalic and atraumatic.  Eyes: Pupils are equal, round, and reactive to light.  Neck: Normal range of motion. Neck supple.  Cardiovascular: Normal rate and regular rhythm.   Pulmonary/Chest: She is in respiratory distress (increased effort of breathing.). She has decreased breath sounds. She has wheezes (mild wheezing).  Abdominal: Soft. There is no tenderness. There is no rigidity and no guarding.  Musculoskeletal:  No significant lower extremity swelling  Neurological: She is alert.  Cranial nerves II-VIII and X-XII evaluated and show no deficits. Pt alert and oriented x 3 Upper and lower extremity strength is symmetrical and physiologic Normal muscular tone No facial droop Coordination intact No pronator drift   Skin: Skin is warm and dry. She is not diaphoretic.  Nursing note and vitals reviewed.   ED Course  Procedures (including critical care time) Labs Review Labs Reviewed  CBC WITH DIFFERENTIAL/PLATELET - Abnormal; Notable for the following:    WBC 10.8 (*)    RBC 3.10 (*)    Hemoglobin 9.3 (*)    HCT 28.0 (*)    RDW 16.8 (*)    Neutro Abs 8.3 (*)    All other components within normal limits  COMPREHENSIVE METABOLIC PANEL - Abnormal; Notable for the following:    BUN 45 (*)    Creatinine, Ser 12.76 (*)    Total Protein 6.4 (*)    Albumin 3.1 (*)    GFR calc non Af Amer 3 (*)    GFR calc Af Amer 3 (*)    All other components within normal limits  I-STAT CG4  LACTIC ACID, ED  I-STAT CG4 LACTIC ACID, ED    Imaging Review Dg Chest 2 View  06/09/2015   CLINICAL DATA:  Shortness of breath.  EXAM: CHEST  2 VIEW  COMPARISON:  12/25/2014  FINDINGS: There is symmetric interstitial coarsening with Kerley lines and fissural thickening. Borderline cardiomegaly, stable from prior. Mild aortic tortuosity. No acute osseous findings.  IMPRESSION: Findings consistent with interstitial pulmonary edema. If there are clinical signs of infection, note that bronchitis/atypical pneumonia can also have this appearance.   Electronically Signed   By: Monte Fantasia M.D.   On: 06/09/2015 07:36     EKG Interpretation   Date/Time:  Monday June 09 2015  06:33:44 EDT Ventricular Rate:  93 PR Interval:  193 QRS Duration: 75 QT Interval:  334 QTC Calculation: 415 R Axis:   27 Text Interpretation:  Sinus or ectopic atrial tachycardia Ventricular  premature complex Nonspecific T abnormalities, lateral leads Baseline  wander Artifact When compared with ECG of 07/25/2014 Rate faster Confirmed  by [facility]  MD, Nunzio Cory [phone]) on 06/09/2015 6:48:04 AM      MDM   Final diagnoses:  SOB (shortness of breath)  Dialysis patient    Medications  azithromycin (ZITHROMAX) 500 mg in dextrose 5 % 250 mL IVPB (not administered)  cefTRIAXone (ROCEPHIN) 1 g in dextrose 5 % 50 mL IVPB (not administered)  ipratropium (ATROVENT) nebulizer solution 0.5 mg (0.5 mg Nebulization Given 06/09/15 0701)  albuterol (PROVENTIL) (2.5 MG/3ML) 0.083% nebulizer solution 5 mg (5 mg Nebulization Given 06/09/15 0701)  albuterol (PROVENTIL) (2.5 MG/3ML) 0.083% nebulizer solution 5 mg (5 mg Nebulization Given 06/09/15 0800)  ipratropium (ATROVENT) nebulizer solution 0.5 mg (0.5 mg Nebulization Given 06/09/15 0800)   Patient received two nebulizer treatments in the ED which did help her symptoms. She does continue to have increased effort of breathing but reports having a much easier time breathing. Concern  for possible infection and do not feel patient is a strong candidate for home therapy. Will need dialysis still today.  Dr. Rogers Blocker has agreed to admit patient and requests I start Rocephin and Azithromycin. Also requests holding orders for Tele. @ 10: 30 am NEPHROLOGY CONTACTED REGARDING DIALYSIS, they have agreed to participate in care of patient.  Filed Vitals:   06/09/15 0815  BP: 153/79  Pulse: 91  Resp: [address], PA-C 06/09/15 [place], MD 06/09/15 [place], PA-C 06/09/15 [place], MD 06/09/15 1046

## 2015-06-10 ENCOUNTER — Inpatient Hospital Stay (HOSPITAL_COMMUNITY): Payer: Medicare Other

## 2015-06-10 DIAGNOSIS — R609 Edema, unspecified: Secondary | ICD-10-CM | POA: Insufficient documentation

## 2015-06-10 DIAGNOSIS — Z720 Tobacco use: Secondary | ICD-10-CM

## 2015-06-10 DIAGNOSIS — R0600 Dyspnea, unspecified: Secondary | ICD-10-CM

## 2015-06-10 LAB — COMPREHENSIVE METABOLIC PANEL
ALT: 29 U/L (ref 14–54)
ANION GAP: 12 (ref 5–15)
AST: 27 U/L (ref 15–41)
Albumin: 3 g/dL — ABNORMAL LOW (ref 3.5–5.0)
Alkaline Phosphatase: 117 U/L (ref 38–126)
BUN: 22 mg/dL — AB (ref 6–20)
CALCIUM: 10.2 mg/dL (ref 8.9–10.3)
CO2: 28 mmol/L (ref 22–32)
Chloride: 99 mmol/L — ABNORMAL LOW (ref 101–111)
Creatinine, Ser: 7.93 mg/dL — ABNORMAL HIGH (ref 0.44–1.00)
GFR calc Af Amer: 6 mL/min — ABNORMAL LOW (ref >60)
GFR, EST NON AFRICAN AMERICAN: 5 mL/min — AB (ref >60)
Glucose, Bld: 72 mg/dL (ref 65–99)
Potassium: 4.6 mmol/L (ref 3.5–5.1)
SODIUM: 139 mmol/L (ref 135–145)
Total Bilirubin: 0.7 mg/dL (ref 0.3–1.2)
Total Protein: 6.2 g/dL — ABNORMAL LOW (ref 6.5–8.1)

## 2015-06-10 LAB — CBC
HCT: 27.9 % — ABNORMAL LOW (ref 36.0–46.0)
HEMOGLOBIN: 9.2 g/dL — AB (ref 12.0–15.0)
MCH: 29.9 pg (ref 26.0–34.0)
MCHC: 33 g/dL (ref 30.0–36.0)
MCV: 90.6 fL (ref 78.0–100.0)
Platelets: 247 10*3/uL (ref 150–400)
RBC: 3.08 MIL/uL — ABNORMAL LOW (ref 3.87–5.11)
RDW: 16.8 % — AB (ref 11.5–15.5)
WBC: 8.6 10*3/uL (ref 4.0–10.5)

## 2015-06-10 LAB — GLUCOSE, CAPILLARY: GLUCOSE-CAPILLARY: 82 mg/dL (ref 65–99)

## 2015-06-10 MED ORDER — NICOTINE 7 MG/24HR TD PT24
7.0000 mg | MEDICATED_PATCH | Freq: Every day | TRANSDERMAL | Status: DC
  Administered 2015-06-10 – 2015-06-11 (×2): 7 mg via TRANSDERMAL
  Filled 2015-06-10 (×2): qty 1

## 2015-06-10 MED ORDER — BOOST / RESOURCE BREEZE PO LIQD: 1.0000 | Freq: Two times a day (BID) | ORAL | Status: DC

## 2015-06-10 NOTE — Progress Notes (Signed)
  [facility] Progress Note   Subjective: feels better, but having "hot flash" at this time  Filed Vitals:   06/09/15 2039 06/10/15 0611 06/10/15 0614 06/10/15 1000  BP: 149/85  150/78 149/76  Pulse: 98  102 86  Temp: 98.4 F (36.9 C)  98.4 F (36.9 C) 98.6 F (37 C)  TempSrc: Oral  Oral Oral  Resp: 18  18 16   Height:      Weight:  79.6 kg (175 lb 7.8 oz)    SpO2: 97%  98% 99%   Exam: No distress No jvd Chest clear bilat no rales or rhonchi RRR no MRG Abd soft ntnd no mass or ascites No LE or UE edema Neuro is nf , Ox 3 LUA AVG +bruit  HD: [place] MWF 3.75h   79.5kg   F160   2/2.25 bath   P4  Heparin 4000   Hect 3   Mircera 50 q 4wks last 5/18        Assessment: 1. SOB - due to PNA and vol excess/ edema, improving 2. ESRD MWF HD 3. Vol excess lower edw 4. HTN cont meds 5. Anemia - Hgb stable at 10.3 as an outpt - change to ARanesp 25 start Wed. Start weekly IV Fe 6. Metabolic bone disease - cont meds 7. Nutrition - allow regular diet for now with appropriate selections by pt - add fluid restriction 8. Tobacco abuse - hx chronic bronchitis/COPD - on albuterol.  Plan - HD in am tomorrow, lower edw, poss dc Wed after HD?    Kelly Splinter MD  pager [phone]    cell [phone]  06/10/2015, 12:00 PM     Recent Labs Lab 06/09/15 0706 06/10/15 0458  NA 138 139  K 4.1 4.6  CL 101 99*  CO2 22 28  GLUCOSE 94 72  BUN 45* 22*  CREATININE 12.76* 7.93*  CALCIUM 10.0 10.2    Recent Labs Lab 06/09/15 0706 06/10/15 0458  AST 33 27  ALT 34 29  ALKPHOS 125 117  BILITOT 0.6 0.7  PROT 6.4* 6.2*  ALBUMIN 3.1* 3.0*    Recent Labs Lab 06/09/15 0706 06/10/15 0458  WBC 10.8* 8.6  NEUTROABS 8.3*  --   HGB 9.3* 9.2*  HCT 28.0* 27.9*  MCV 90.3 90.6  PLT 272 247   . amLODipine  10 mg Oral Daily  . antiseptic oral rinse  7 mL Mouth Rinse BID  . azithromycin  500 mg Intravenous Q24H  . cefTRIAXone (ROCEPHIN)  IV  1 g Intravenous Q24H  .  cinacalcet  60 mg Oral Q breakfast  . [START ON 06/11/2015] darbepoetin (ARANESP) injection - DIALYSIS  25 mcg Intravenous Q Wed-HD  . doxercalciferol  3 mcg Intravenous Q M,W,F-HD  . [START ON 06/11/2015] ferric gluconate (FERRLECIT/NULECIT) IV  62.5 mg Intravenous Q Wed-HD  . gabapentin  300 mg Oral Daily  . lanthanum  2,000 mg Oral TID WC  . losartan  100 mg Oral Daily  . multivitamin  1 tablet Oral QHS  . pravastatin  20 mg Oral Daily  . sodium chloride  3 mL Intravenous Q12H   . sodium chloride 150 mL (06/09/15 1245)   acetaminophen **OR** acetaminophen, ibuprofen, ipratropium-albuterol, ondansetron **OR** ondansetron (ZOFRAN) IV

## 2015-06-10 NOTE — Progress Notes (Signed)
PROGRESS NOTE  Kim Hodge NID:[id number] DOB: [birth year] DOA: 06/09/2015 PCP: Olga Millers, MD  Brief History 59 year old female with a history of ESRD, lipidemia, hypertension presented with a 2 to three-day history of increasing shortness of breath. The patient is compliant with dialysis and stays for the full duration of her dialysis sessions. Prior to admission, the patient last had dialysis on 06/06/2015. The patient denied any fevers, chills, chest discomfort, nausea, vomiting, diarrhea. Upon admission, the patient had a chest x-ray which suggested increased interstitial markings with concerns for interstitial pneumonitis. The patient was seen by nephrology. The patient was started initially on ceftriaxone and azithromycin. She underwent dialysis on 06/09/2015. Assessment/Plan: Hypoxemia -Secondary to fluid overload/excess in the setting of likely underlying COPD, continued tobacco use, and recent environmental exposure to fumigation.  ?recent URI -Discontinue antibiotics -Presently stable on room air -Continue albuterol when necessary shortness of breath -Patient had dialysis 06/09/2015 in the hospital with lowering of EDW -06/10/2015--chest x-ray shows resolution of interstitial edema ESRD -Appreciate nephrology -Plan for dialysis 06/11/2015 Hypertension  -Continue amlodipine and losartan  Tobacco abuse  -Tobacco cessation discussed  -NicoDerm patch  Anemia of CKD  -Hemoglobin stable    Family Communication:   Pt at beside Disposition Plan:   Discharge home after dialysis 06/11/2015     Procedures/Studies: Dg Chest 2 View  06/09/2015   CLINICAL DATA:  Shortness of breath.  EXAM: CHEST  2 VIEW  COMPARISON:  12/25/2014  FINDINGS: There is symmetric interstitial coarsening with Kerley lines and fissural thickening. Borderline cardiomegaly, stable from prior. Mild aortic tortuosity. No acute osseous findings.  IMPRESSION: Findings consistent with  interstitial pulmonary edema. If there are clinical signs of infection, note that bronchitis/atypical pneumonia can also have this appearance.   Electronically Signed   By: Monte Fantasia M.D.   On: 06/09/2015 07:36   Dg Chest 1v Repeat Same Day  06/10/2015   CLINICAL DATA:  Interstitial edema, and dizziness when standing  EXAM: CHEST - 1 VIEW SAME DAY  COMPARISON:  Chest x-ray of 06/09/2015  FINDINGS: The mild interstitial edema pattern has resolved. No infiltrate or effusion is currently seen. The heart is mildly enlarged and stable. No bony abnormality is seen.  IMPRESSION: Resolution of interstitial edema.  No active process.   Electronically Signed   By: Ivar Drape M.D.   On: 06/10/2015 08:02        Subjective: Patient is breathing much better today. She denies any fevers, chills, chest discomfort, nausea, vomiting, diarrhea, abdominal pain, dysuria, hematuria.   Objective: Filed Vitals:   06/09/15 2039 06/10/15 0611 06/10/15 0614 06/10/15 1000  BP: 149/85  150/78 149/76  Pulse: 98  102 86  Temp: 98.4 F (36.9 C)  98.4 F (36.9 C) 98.6 F (37 C)  TempSrc: Oral  Oral Oral  Resp: 18  18 16   Height:      Weight:  79.6 kg (175 lb 7.8 oz)    SpO2: 97%  98% 99%    Intake/Output Summary (Last 24 hours) at 06/10/15 1343 Last data filed at 06/10/15 1443  Gross per 24 hour  Intake   1624 ml  Output   2732 ml  Net  -1108 ml   Weight change:  Exam:   General:  Pt is alert, follows commands appropriately, not in acute distress  HEENT: No icterus, No thrush, No neck mass, [place]/AT  Cardiovascular: RRR, S1/S2, no rubs, no gallops  Respiratory: CTA  bilaterally, no wheezing, no crackles, no rhonchi  Abdomen: Soft/+BS, non tender, non distended, no guarding  Extremities: No edema, No lymphangitis, No petechiae, No rashes, no synovitis  Data Reviewed: Basic Metabolic Panel:  Recent Labs Lab 06/09/15 0706 06/10/15 0458  NA 138 139  K 4.1 4.6  CL 101 99*  CO2 22 28    GLUCOSE 94 72  BUN 45* 22*  CREATININE 12.76* 7.93*  CALCIUM 10.0 10.2   Liver Function Tests:  Recent Labs Lab 06/09/15 0706 06/10/15 0458  AST 33 27  ALT 34 29  ALKPHOS 125 117  BILITOT 0.6 0.7  PROT 6.4* 6.2*  ALBUMIN 3.1* 3.0*   No results for input(s): LIPASE, AMYLASE in the last 168 hours. No results for input(s): AMMONIA in the last 168 hours. CBC:  Recent Labs Lab 06/09/15 0706 06/10/15 0458  WBC 10.8* 8.6  NEUTROABS 8.3*  --   HGB 9.3* 9.2*  HCT 28.0* 27.9*  MCV 90.3 90.6  PLT 272 247   Cardiac Enzymes: No results for input(s): CKTOTAL, CKMB, CKMBINDEX, TROPONINI in the last 168 hours. BNP: Invalid input(s): POCBNP CBG:  Recent Labs Lab 06/10/15 0830  GLUCAP 82    Recent Results (from the past 240 hour(s))  Culture, blood (routine x 2) Call MD if unable to obtain prior to antibiotics being given     Status: None (Preliminary result)   Collection Time: 06/09/15 10:39 AM  Result Value Ref Range Status   Specimen Description BLOOD RIGHT ARM  Final   Special Requests BOTTLES DRAWN AEROBIC AND ANAEROBIC 5CC  Final   Culture   Final           BLOOD CULTURE RECEIVED NO GROWTH TO DATE CULTURE WILL BE HELD FOR 5 DAYS BEFORE ISSUING A FINAL NEGATIVE REPORT Performed at [facility]    Report Status PENDING  Incomplete  Culture, blood (routine x 2) Call MD if unable to obtain prior to antibiotics being given     Status: None (Preliminary result)   Collection Time: 06/09/15 10:42 AM  Result Value Ref Range Status   Specimen Description BLOOD RIGHT FOREARM  Final   Special Requests BOTTLES DRAWN AEROBIC ONLY 5CC   Final   Culture   Final           BLOOD CULTURE RECEIVED NO GROWTH TO DATE CULTURE WILL BE HELD FOR 5 DAYS BEFORE ISSUING A FINAL NEGATIVE REPORT Performed at [facility]    Report Status PENDING  Incomplete     Scheduled Meds: . amLODipine  10 mg Oral Daily  . antiseptic oral rinse  7 mL Mouth Rinse BID  . cinacalcet  60  mg Oral Q breakfast  . [START ON 06/11/2015] darbepoetin (ARANESP) injection - DIALYSIS  25 mcg Intravenous Q Wed-HD  . doxercalciferol  3 mcg Intravenous Q M,W,F-HD  . [START ON 06/11/2015] ferric gluconate (FERRLECIT/NULECIT) IV  62.5 mg Intravenous Q Wed-HD  . gabapentin  300 mg Oral Daily  . lanthanum  2,000 mg Oral TID WC  . losartan  100 mg Oral Daily  . multivitamin  1 tablet Oral QHS  . pravastatin  20 mg Oral Daily  . sodium chloride  3 mL Intravenous Q12H   Continuous Infusions: . sodium chloride 150 mL (06/09/15 1245)     Kennette Cuthrell, DO  [facility] Pager [phone]  If 7PM-7AM, please contact night-coverage [email or web address] Password [email or web address] 06/10/2015, 1:43 PM   LOS: 1 day

## 2015-06-10 NOTE — Progress Notes (Signed)
Initial Nutrition Assessment  DOCUMENTATION CODES:  Obesity unspecified  INTERVENTION:  Resource Breeze po BID, each supplement provides 250 kcal and 9 grams of protein  NUTRITION DIAGNOSIS:  Inadequate oral intake related to nausea as evidenced by per patient/family report, meal completion < 50%.   GOAL:  Patient will meet greater than or equal to 90% of their needs   MONITOR:  PO intake, Supplement acceptance, Labs, Weight trends, Skin, I & O's  REASON FOR ASSESSMENT:  Malnutrition Screening Tool    ASSESSMENT: 59 year old female with past medical history significant for end-stage renal disease on hemodialysis (MWF), has completed hemodialysis on Friday, history of dyslipidemia, hypertension, smoking. Patient presented to [facility] ED for evaluation of ongoing shortness of breath despite having hemodialysis on Friday for full session. Patient reports no associated cough, fevers or chills.   Pt reports decreased appetite and weight loss over the past several months. She complains of nausea during visit and reports this is a large barrier to taking in adequate nutrition. Noted that pt consumed about 30% of her lunch tray.  She reports that she is very concerned about her weight loss, sharing with this RD that her dry weight is usually 86.5 kg, but this has recently been adjusted to 79.5 kg. She reveals that it is difficult to tell if her weight loss is related to fluid loss or decreased intake.   She complains about feeling weaker, admitting that she is usually active, despite being on dialysis (she shares that she participates with a local dance team several day a week).   She reports that she has been recommended Nepro supplements in the past, but does not like the flavor. She is amenable to try [employer]. Discussed importance of nutritional adequacy to promote healing.   Nutrition-Focused physical exam completed. Findings are no fat depletion, mild muscle depletion,  and no edema.   Height:  Ht Readings from Last 1 Encounters:  06/09/15 5\' 3"  (1.6 m)    Weight:  Wt Readings from Last 1 Encounters:  06/10/15 175 lb 7.8 oz (79.6 kg)    Ideal Body Weight:  52.2 kg  Wt Readings from Last 10 Encounters:  06/10/15 175 lb 7.8 oz (79.6 kg)  06/05/15 177 lb (80.287 kg)  10/01/14 182 lb (82.555 kg)  01/12/12 184 lb 4.9 oz (83.6 kg)  09/01/11 172 lb (78.019 kg)  09/24/10 156 lb (70.761 kg)  07/23/10 168 lb 4 oz (76.318 kg)  07/07/10 172 lb (78.019 kg)  05/07/10 189 lb (85.73 kg)    BMI:  Body mass index is 31.09 kg/(m^2).  Estimated Nutritional Needs:  Kcal:  1900-2100  Protein:  95-105 grams  Fluid:  >1.5 L  Skin:  Reviewed, no issues  Diet Order:  Diet regular Room service appropriate?: Yes; Fluid consistency:: Thin; Fluid restriction:: 1200 mL Fluid  EDUCATION NEEDS:  Education needs addressed   Intake/Output Summary (Last 24 hours) at 06/10/15 1623 Last data filed at 06/10/15 0907  Gross per 24 hour  Intake   1387 ml  Output   2732 ml  Net  -1345 ml    Last BM:  06/10/15  Braylie Badami A. Jimmye Norman, RD, LDN, CDE Pager: [phone] After hours Pager: [phone]

## 2015-06-10 NOTE — Plan of Care (Signed)
Problem: Phase II Progression Outcomes Goal: Progress activity as tolerated unless otherwise ordered Outcome: Completed/Met Date Met:  06/10/15 Walked in halls today

## 2015-06-11 DIAGNOSIS — Z992 Dependence on renal dialysis: Secondary | ICD-10-CM

## 2015-06-11 DIAGNOSIS — N186 End stage renal disease: Secondary | ICD-10-CM

## 2015-06-11 LAB — RENAL FUNCTION PANEL
ANION GAP: 12 (ref 5–15)
Albumin: 3 g/dL — ABNORMAL LOW (ref 3.5–5.0)
BUN: 30 mg/dL — AB (ref 6–20)
CO2: 29 mmol/L (ref 22–32)
Calcium: 10.1 mg/dL (ref 8.9–10.3)
Chloride: 97 mmol/L — ABNORMAL LOW (ref 101–111)
Creatinine, Ser: 10.88 mg/dL — ABNORMAL HIGH (ref 0.44–1.00)
GFR calc Af Amer: 4 mL/min — ABNORMAL LOW (ref >60)
GFR calc non Af Amer: 3 mL/min — ABNORMAL LOW (ref >60)
Glucose, Bld: 87 mg/dL (ref 65–99)
PHOSPHORUS: 4.8 mg/dL — AB (ref 2.5–4.6)
Potassium: 4.6 mmol/L (ref 3.5–5.1)
Sodium: 138 mmol/L (ref 135–145)

## 2015-06-11 LAB — CBC
HCT: 28.8 % — ABNORMAL LOW (ref 36.0–46.0)
Hemoglobin: 9.4 g/dL — ABNORMAL LOW (ref 12.0–15.0)
MCH: 29.7 pg (ref 26.0–34.0)
MCHC: 32.6 g/dL (ref 30.0–36.0)
MCV: 90.9 fL (ref 78.0–100.0)
Platelets: 256 10*3/uL (ref 150–400)
RBC: 3.17 MIL/uL — AB (ref 3.87–5.11)
RDW: 16.4 % — ABNORMAL HIGH (ref 11.5–15.5)
WBC: 8.3 10*3/uL (ref 4.0–10.5)

## 2015-06-11 LAB — GLUCOSE, CAPILLARY: Glucose-Capillary: 121 mg/dL — ABNORMAL HIGH (ref 65–99)

## 2015-06-11 MED ORDER — ALTEPLASE 2 MG IJ SOLR
2.0000 mg | Freq: Once | INTRAMUSCULAR | Status: DC | PRN
  Filled 2015-06-11: qty 2

## 2015-06-11 MED ORDER — DARBEPOETIN ALFA 25 MCG/0.42ML IJ SOSY
PREFILLED_SYRINGE | INTRAMUSCULAR | Status: AC
  Administered 2015-06-11: 25 ug via INTRAVENOUS
  Filled 2015-06-11: qty 0.42

## 2015-06-11 MED ORDER — SODIUM CHLORIDE 0.9 % IV SOLN: 100.0000 mL | INTRAVENOUS | Status: DC | PRN

## 2015-06-11 MED ORDER — HEPARIN SODIUM (PORCINE) 1000 UNIT/ML DIALYSIS
4000.0000 [IU] | Freq: Once | INTRAMUSCULAR | Status: DC
  Filled 2015-06-11: qty 4

## 2015-06-11 MED ORDER — HEPARIN SODIUM (PORCINE) 1000 UNIT/ML DIALYSIS
1000.0000 [IU] | INTRAMUSCULAR | Status: DC | PRN
  Filled 2015-06-11: qty 1

## 2015-06-11 MED ORDER — LIDOCAINE HCL (PF) 1 % IJ SOLN: 5.0000 mL | INTRAMUSCULAR | Status: DC | PRN

## 2015-06-11 MED ORDER — LIDOCAINE-PRILOCAINE 2.5-2.5 % EX CREA: 1.0000 "application " | TOPICAL_CREAM | CUTANEOUS | Status: DC | PRN

## 2015-06-11 MED ORDER — DOXERCALCIFEROL 4 MCG/2ML IV SOLN
INTRAVENOUS | Status: AC
  Administered 2015-06-11: 3 ug via INTRAVENOUS
  Filled 2015-06-11: qty 2

## 2015-06-11 MED ORDER — NEPRO/CARBSTEADY PO LIQD: 237.0000 mL | ORAL | Status: DC | PRN

## 2015-06-11 MED ORDER — PENTAFLUOROPROP-TETRAFLUOROETH EX AERO: 1.0000 "application " | INHALATION_SPRAY | CUTANEOUS | Status: DC | PRN

## 2015-06-11 NOTE — Discharge Summary (Signed)
[facility]  Physician Discharge Summary   Patient ID: Kim Hodge MRN: [id number] DOB/AGE: 1956/09/11 59 y.o.  Admit date: 06/09/2015 Discharge date: 06/11/2015  PCP: Olga Millers, MD  DISCHARGE DIAGNOSES:  Principal Problem:   Lobar pneumonia due to unspecified organism Active Problems:   Hyperlipidemia   Essential hypertension   End stage renal disease on dialysis   Tobacco abuse   Leukocytosis   Anemia of chronic disease   Interstitial edema   RECOMMENDATIONS FOR OUTPATIENT FOLLOW UP: 1. Continue with regular outpatient dialysis as before.  DISCHARGE CONDITION: fair  Diet recommendation: Low Sodium  Filed Weights   06/10/15 0611 06/11/15 0659 06/11/15 1038  Weight: 79.6 kg (175 lb 7.8 oz) 79 kg (174 lb 2.6 oz) 76.9 kg (169 lb 8.5 oz)    INITIAL HISTORY: 59 year old female with a history of ESRD, lipidemia, hypertension presented with a 2 to three-day history of increasing shortness of breath. The patient is compliant with dialysis and stays for the full duration of her dialysis sessions. Prior to admission, the patient last had dialysis on 06/06/2015. The patient denied any fevers, chills, chest discomfort, nausea, vomiting, diarrhea. Upon admission, the patient had a chest x-ray which suggested increased interstitial markings with concerns for interstitial pneumonitis. The patient was seen by nephrology. The patient was started initially on ceftriaxone and azithromycin. She underwent dialysis on 06/09/2015.  Consultations:  Nephrology  Procedures:  Hemodialysis  HOSPITAL COURSE:   Acute respiratory failure with Hypoxemia/Pulmonary Edema This was secondary to fluid overload/excess in the setting of likely underlying COPD, continued tobacco use, and recent environmental exposure to fumigation.Patient was initially started on antibiotics due to concern for an infectious process. However, considering her rapid improvement after dialysis it was  felt that this was predominantly fluid overload. Antibiotics were discontinued. Patient continued to improve symptomatically. Reason for her fluid overload is not entirely clear. She is compliant with her dialysis regimen. No recent indiscretion with diet. Nephrology has lowered her EDW. Repeat chest x-ray shows resolution of the infiltrates consistent with edema rather than infection. She was noted to be saturating normally on room air.  ESRD on hemodialysis Monday, Wednesday, Friday Patient was dialyzed per her usual schedule by nephrology.  History of essential Hypertension  Continue amlodipine and losartan   Tobacco abuse  Tobacco cessation discussed  Anemia of CKD  Hemoglobin stable   Overall, stable. Okay for discharge today.   PERTINENT LABS:  The results of significant diagnostics from this hospitalization (including imaging, microbiology, ancillary and laboratory) are listed below for reference.    Microbiology: Recent Results (from the past 240 hour(s))  Culture, blood (routine x 2) Call MD if unable to obtain prior to antibiotics being given     Status: None (Preliminary result)   Collection Time: 06/09/15 10:39 AM  Result Value Ref Range Status   Specimen Description BLOOD RIGHT ARM  Final   Special Requests BOTTLES DRAWN AEROBIC AND ANAEROBIC 5CC  Final   Culture   Final           BLOOD CULTURE RECEIVED NO GROWTH TO DATE CULTURE WILL BE HELD FOR 5 DAYS BEFORE ISSUING A FINAL NEGATIVE REPORT Performed at [facility]    Report Status PENDING  Incomplete  Culture, blood (routine x 2) Call MD if unable to obtain prior to antibiotics being given     Status: None (Preliminary result)   Collection Time: 06/09/15 10:42 AM  Result Value Ref Range Status   Specimen Description BLOOD RIGHT  FOREARM  Final   Special Requests BOTTLES DRAWN AEROBIC ONLY 5CC   Final   Culture   Final           BLOOD CULTURE RECEIVED NO GROWTH TO DATE CULTURE WILL BE HELD FOR 5 DAYS  BEFORE ISSUING A FINAL NEGATIVE REPORT Performed at [facility]    Report Status PENDING  Incomplete     Labs: Basic Metabolic Panel:  Recent Labs Lab 06/09/15 0706 06/10/15 0458 06/11/15 0721  NA 138 139 138  K 4.1 4.6 4.6  CL 101 99* 97*  CO2 22 28 29   GLUCOSE 94 72 87  BUN 45* 22* 30*  CREATININE 12.76* 7.93* 10.88*  CALCIUM 10.0 10.2 10.1  PHOS  --   --  4.8*   Liver Function Tests:  Recent Labs Lab 06/09/15 0706 06/10/15 0458 06/11/15 0721  AST 33 27  --   ALT 34 29  --   ALKPHOS 125 117  --   BILITOT 0.6 0.7  --   PROT 6.4* 6.2*  --   ALBUMIN 3.1* 3.0* 3.0*   CBC:  Recent Labs Lab 06/09/15 0706 06/10/15 0458 06/11/15 0721  WBC 10.8* 8.6 8.3  NEUTROABS 8.3*  --   --   HGB 9.3* 9.2* 9.4*  HCT 28.0* 27.9* 28.8*  MCV 90.3 90.6 90.9  PLT 272 247 256   CBG:  Recent Labs Lab 06/10/15 0830 06/11/15 1208  GLUCAP 82 121*     IMAGING STUDIES Dg Chest 2 View  06/09/2015   CLINICAL DATA:  Shortness of breath.  EXAM: CHEST  2 VIEW  COMPARISON:  12/25/2014  FINDINGS: There is symmetric interstitial coarsening with Kerley lines and fissural thickening. Borderline cardiomegaly, stable from prior. Mild aortic tortuosity. No acute osseous findings.  IMPRESSION: Findings consistent with interstitial pulmonary edema. If there are clinical signs of infection, note that bronchitis/atypical pneumonia can also have this appearance.   Electronically Signed   By: Monte Fantasia M.D.   On: 06/09/2015 07:36   Dg Chest 1v Repeat Same Day  06/10/2015   CLINICAL DATA:  Interstitial edema, and dizziness when standing  EXAM: CHEST - 1 VIEW SAME DAY  COMPARISON:  Chest x-ray of 06/09/2015  FINDINGS: The mild interstitial edema pattern has resolved. No infiltrate or effusion is currently seen. The heart is mildly enlarged and stable. No bony abnormality is seen.  IMPRESSION: Resolution of interstitial edema.  No active process.   Electronically Signed   By: Ivar Drape  M.D.   On: 06/10/2015 08:02    DISCHARGE EXAMINATION: Filed Vitals:   06/11/15 1004 06/11/15 1030 06/11/15 1038 06/11/15 1210  BP: 152/95 155/93 164/91 141/93  Pulse: 76 72 76 78  Temp:    97.8 F (36.6 C)  TempSrc:    Oral  Resp:   16 18  Height:      Weight:   76.9 kg (169 lb 8.5 oz)   SpO2:   96% 100%   General appearance: alert, cooperative, appears stated age and no distress Resp: clear to auscultation bilaterally Cardio: regular rate and rhythm, S1, S2 normal, no murmur, click, rub or gallop GI: soft, non-tender; bowel sounds normal; no masses,  no organomegaly Neurologic: No focal deficits  DISPOSITION: Home  Discharge Instructions    Call MD for:  difficulty breathing, headache or visual disturbances    Complete by:  As directed      Call MD for:  extreme fatigue    Complete by:  As directed  Call MD for:  persistant dizziness or light-headedness    Complete by:  As directed      Call MD for:  persistant nausea and vomiting    Complete by:  As directed      Diet - low sodium heart healthy    Complete by:  As directed      Discharge instructions    Complete by:  As directed   Please go for dialysis as before. Avoid salt in diet.  You were cared for by a hospitalist during your hospital stay. If you have any questions about your discharge medications or the care you received while you were in the hospital after you are discharged, you can call the unit and asked to speak with the hospitalist on call if the hospitalist that took care of you is not available. Once you are discharged, your primary care physician will handle any further medical issues. Please note that NO REFILLS for any discharge medications will be authorized once you are discharged, as it is imperative that you return to your primary care physician (or establish a relationship with a primary care physician if you do not have one) for your aftercare needs so that they can reassess your need for  medications and monitor your lab values. If you do not have a primary care physician, you can call [phone] for a physician referral.     Increase activity slowly    Complete by:  As directed            ALLERGIES:  Allergies  Allergen Reactions  . Ace Inhibitors Anaphylaxis, Shortness Of Breath and Swelling    Tongue and throat swells, cannot breathe     Discharge Medication List as of 06/11/2015  2:13 PM    CONTINUE these medications which have NOT CHANGED   Details  acetaminophen (TYLENOL) 500 MG tablet Take 1,500 mg by mouth every 6 (six) hours as needed for moderate pain or headache., Until Discontinued, Historical Med    albuterol (PROVENTIL HFA;VENTOLIN HFA) 108 (90 BASE) MCG/ACT inhaler Inhale 1-2 puffs into the lungs every 6 (six) hours as needed for wheezing or shortness of breath., Starting 08/11/2014, Until Discontinued, Normal    amLODipine (NORVASC) 10 MG tablet Take 10 mg by mouth daily., Until Discontinued, Historical Med    cinacalcet (SENSIPAR) 60 MG tablet Take 60 mg by mouth daily., Until Discontinued, Historical Med    folic acid-vitamin b complex-vitamin c-selenium-zinc (DIALYVITE) 3 MG TABS Take 1 tablet by mouth daily.  , Until Discontinued, Historical Med    gabapentin (NEURONTIN) 300 MG capsule Take 300 mg by mouth daily., Until Discontinued, Historical Med    ibuprofen (ADVIL,MOTRIN) 200 MG tablet Take 600 mg by mouth every 6 (six) hours as needed for moderate pain., Until Discontinued, Historical Med    lanthanum (FOSRENOL) 1000 MG chewable tablet Chew 2,000 mg by mouth 3 (three) times daily with meals. , Until Discontinued, Historical Med    losartan (COZAAR) 100 MG tablet Take 100 mg by mouth daily., Until Discontinued, Historical Med    pravastatin (PRAVACHOL) 20 MG tablet Take 20 mg by mouth daily.  , Until Discontinued, Historical Med    Pseudoephedrine-Acetaminophen (SINUS PO) Take 1 tablet by mouth daily as needed (for congestion). OTC, Until  Discontinued, Historical Med      STOP taking these medications     Cimetidine (ACID REDUCER 200 PO)      fluticasone (FLONASE) 50 MCG/ACT nasal spray  Follow-up Information    Follow up with Olga Millers, MD. Schedule an appointment as soon as possible for a visit in 1 week.   Specialty:  Internal Medicine   Why:  post hospitalization follow up   Contact information:   [address] [place] [address] [phone]       TOTAL DISCHARGE TIME: 42 minutes  [facility] Pager [phone]  06/11/2015, 4:31 PM

## 2015-06-11 NOTE — Progress Notes (Signed)
  [facility] Progress Note   Subjective: feels better, breathing better; CXR yest w resolution of IS edema  Filed Vitals:   06/11/15 0923 06/11/15 0950 06/11/15 1004 06/11/15 1030  BP: 159/89 152/95 152/95 155/93  Pulse: 78 75 76 72  Temp:      TempSrc:      Resp:      Height:      Weight:      SpO2:       Exam: No distress No jvd Chest clear bilat no rales or rhonchi RRR no MRG Abd soft ntnd no mass or ascites No LE or UE edema Neuro is nf , Ox 3 LUA AVG +bruit  HD: [place] MWF 3.75h   79.5kg   F160   2/2.25 bath   P4  Heparin 4000   Hect 3   Mircera 50 q 4wks last 5/18        Assessment: 1. SOB / vol excess / pulm edema , resolving 2. ESRD MWF HD 3. HTN cont meds 4. Anemia - Hgb stable at 10.3 as an outpt - change to ARanesp 25 start Wed. Start weekly IV Fe 5. Metabolic bone disease - cont meds 6. Tobacco abuse - hx chronic bronchitis/COPD - on albuterol.  Plan - HD get vol down further, lower dry wt, prob dc today    Kelly Splinter MD  pager [phone]    cell [phone]  06/11/2015, 10:52 AM     Recent Labs Lab 06/09/15 0706 06/10/15 0458 06/11/15 0721  NA 138 139 138  K 4.1 4.6 4.6  CL 101 99* 97*  CO2 22 28 29   GLUCOSE 94 72 87  BUN 45* 22* 30*  CREATININE 12.76* 7.93* 10.88*  CALCIUM 10.0 10.2 10.1  PHOS  --   --  4.8*    Recent Labs Lab 06/09/15 0706 06/10/15 0458 06/11/15 0721  AST 33 27  --   ALT 34 29  --   ALKPHOS 125 117  --   BILITOT 0.6 0.7  --   PROT 6.4* 6.2*  --   ALBUMIN 3.1* 3.0* 3.0*    Recent Labs Lab 06/09/15 0706 06/10/15 0458 06/11/15 0721  WBC 10.8* 8.6 8.3  NEUTROABS 8.3*  --   --   HGB 9.3* 9.2* 9.4*  HCT 28.0* 27.9* 28.8*  MCV 90.3 90.6 90.9  PLT 272 247 256   . amLODipine  10 mg Oral Daily  . antiseptic oral rinse  7 mL Mouth Rinse BID  . cinacalcet  60 mg Oral Q breakfast  . darbepoetin (ARANESP) injection - DIALYSIS  25 mcg Intravenous Q Wed-HD  . doxercalciferol  3 mcg  Intravenous Q M,W,F-HD  . feeding supplement (RESOURCE BREEZE)  1 Container Oral BID BM  . ferric gluconate (FERRLECIT/NULECIT) IV  62.5 mg Intravenous Q Wed-HD  . gabapentin  300 mg Oral Daily  . [START ON 06/12/2015] heparin  4,000 Units Dialysis Once in dialysis  . lanthanum  2,000 mg Oral TID WC  . losartan  100 mg Oral Daily  . multivitamin  1 tablet Oral QHS  . nicotine  7 mg Transdermal Daily  . pravastatin  20 mg Oral Daily  . sodium chloride  3 mL Intravenous Q12H   . sodium chloride Stopped (06/10/15 0700)   sodium chloride, sodium chloride, acetaminophen **OR** acetaminophen, alteplase, feeding supplement (NEPRO CARB STEADY), heparin, ibuprofen, ipratropium-albuterol, lidocaine (PF), lidocaine-prilocaine, ondansetron **OR** ondansetron (ZOFRAN) IV, pentafluoroprop-tetrafluoroeth

## 2015-06-11 NOTE — Progress Notes (Signed)
Patient was discharged home by MD order; discharged instructions  review and give to patient with care notes; AV fistula in RUA; skin intact; patient will be escorted to the car by nurse tech via wheelchair.

## 2015-06-11 NOTE — Discharge Instructions (Signed)
End-Stage Kidney Disease °The kidneys are two organs that lie on either side of the spine between the middle of the back and the front of the abdomen. The kidneys:  °· Remove wastes and extra water from the blood.   °· Produce important hormones. These help keep bones strong, regulate blood pressure, and help create red blood cells.   °· Balance the fluids and chemicals in the blood and tissues. °End-stage kidney disease occurs when the kidneys are so damaged that they cannot do their job. When the kidneys cannot do their job, life-threatening problems occur. The body cannot stay clean and strong without the help of the kidneys. In end-stage kidney disease, the kidneys cannot get better. You need a new kidney or treatments to do some of the work healthy kidneys do in order to stay alive. °CAUSES  °End-stage kidney disease usually occurs when a long-lasting (chronic) kidney disease gets worse. It may also occur after the kidneys are suddenly damaged (acute kidney injury).  °SYMPTOMS  °· Swelling (edema) of the legs, ankles, or feet.   °· Tiredness (lethargy).   °· Nausea or vomiting.   °· Confusion.   °· Problems with urination, such as:   °¨ Decreased urine production.   °¨ Frequent urination, especially at night.   °¨ Frequent accidents in children who are potty trained.   °· Muscle twitches and cramps.   °· Persistent itchiness.   °· Loss of appetite.   °· Headaches.   °· Abnormally dark or light skin.   °· Numbness in the hands or feet.   °· Easy bruising.   °· Frequent hiccups.   °· Menstruation stops. °DIAGNOSIS  °Your health care provider will measure your blood pressure and take some tests. These may include:  °· Urine tests.   °· Blood tests.   °· Imaging tests, such as:   °¨ An ultrasound exam.   °¨ Computed tomography (CT). °· A kidney biopsy. °TREATMENT  °There are two treatments for end-stage kidney disease:  °· A procedure that removes toxic wastes from the body (dialysis).   °· Receiving a new kidney  (kidney transplant). °Both of these treatments have serious risks and consequences. Your health care provider will help you determine which treatment is best for you based on your health, age, and other factors. In addition to having dialysis or a kidney transplant, you may need to take medicines to control high blood pressure (hypertension) and cholesterol and to decrease phosphorus levels in your blood.  °HOME CARE INSTRUCTIONS °· Follow your prescribed diet.   °· Take medicines only as directed by your health care provider.   °· Do not take any new medicines (prescription, over-the-counter, or nutritional supplements) unless approved by your health care provider. Many medicines can worsen your kidney damage or need to have the dose adjusted.   °· Keep all follow-up visits as directed by your health care provider. °MAKE SURE YOU: °· Understand these instructions. °· Will watch your condition. °· Will get help right away if you are not doing well or get worse. °Document Released: 03/04/2004 Document Revised: 04/29/2014 Document Reviewed: 08/11/2012 °ExitCare® Patient Information ©2015 [employer]. This information is not intended to replace advice given to you by your health care provider. Make sure you discuss any questions you have with your health care provider. ° °

## 2015-06-15 LAB — CULTURE, BLOOD (ROUTINE X 2)
CULTURE: NO GROWTH
Culture: NO GROWTH

## 2015-08-26 ENCOUNTER — Encounter: Payer: PRIVATE HEALTH INSURANCE | Admitting: Internal Medicine

## 2015-10-09 ENCOUNTER — Ambulatory Visit: Payer: PRIVATE HEALTH INSURANCE | Admitting: Internal Medicine

## 2015-10-21 ENCOUNTER — Other Ambulatory Visit: Payer: Self-pay

## 2015-10-21 DIAGNOSIS — Z1231 Encounter for screening mammogram for malignant neoplasm of breast: Secondary | ICD-10-CM

## 2015-11-06 ENCOUNTER — Encounter (HOSPITAL_COMMUNITY): Payer: Self-pay | Admitting: *Deleted

## 2015-11-06 ENCOUNTER — Emergency Department (HOSPITAL_COMMUNITY): Payer: Medicare Other

## 2015-11-06 ENCOUNTER — Inpatient Hospital Stay (HOSPITAL_COMMUNITY)
Admission: EM | Admit: 2015-11-06 | Discharge: 2015-11-09 | DRG: 193 | Disposition: A | Discharge status: Home / Self Care | Payer: Medicare Other | Attending: Internal Medicine | Admitting: Internal Medicine

## 2015-11-06 DIAGNOSIS — E669 Obesity, unspecified: Secondary | ICD-10-CM | POA: Diagnosis present

## 2015-11-06 DIAGNOSIS — Z87441 Personal history of nephrotic syndrome: Secondary | ICD-10-CM | POA: Diagnosis not present

## 2015-11-06 DIAGNOSIS — Z823 Family history of stroke: Secondary | ICD-10-CM | POA: Diagnosis not present

## 2015-11-06 DIAGNOSIS — N2581 Secondary hyperparathyroidism of renal origin: Secondary | ICD-10-CM | POA: Diagnosis present

## 2015-11-06 DIAGNOSIS — K219 Gastro-esophageal reflux disease without esophagitis: Secondary | ICD-10-CM | POA: Diagnosis present

## 2015-11-06 DIAGNOSIS — Z90710 Acquired absence of both cervix and uterus: Secondary | ICD-10-CM

## 2015-11-06 DIAGNOSIS — T50901A Poisoning by unspecified drugs, medicaments and biological substances, accidental (unintentional), initial encounter: Secondary | ICD-10-CM

## 2015-11-06 DIAGNOSIS — J180 Bronchopneumonia, unspecified organism: Secondary | ICD-10-CM | POA: Diagnosis present

## 2015-11-06 DIAGNOSIS — Y95 Nosocomial condition: Secondary | ICD-10-CM | POA: Diagnosis present

## 2015-11-06 DIAGNOSIS — I120 Hypertensive chronic kidney disease with stage 5 chronic kidney disease or end stage renal disease: Secondary | ICD-10-CM | POA: Diagnosis present

## 2015-11-06 DIAGNOSIS — N186 End stage renal disease: Secondary | ICD-10-CM | POA: Diagnosis not present

## 2015-11-06 DIAGNOSIS — F17210 Nicotine dependence, cigarettes, uncomplicated: Secondary | ICD-10-CM | POA: Diagnosis present

## 2015-11-06 DIAGNOSIS — R55 Syncope and collapse: Secondary | ICD-10-CM | POA: Diagnosis not present

## 2015-11-06 DIAGNOSIS — J189 Pneumonia, unspecified organism: Principal | ICD-10-CM | POA: Diagnosis present

## 2015-11-06 DIAGNOSIS — I10 Essential (primary) hypertension: Secondary | ICD-10-CM | POA: Diagnosis not present

## 2015-11-06 DIAGNOSIS — G2581 Restless legs syndrome: Secondary | ICD-10-CM | POA: Diagnosis present

## 2015-11-06 DIAGNOSIS — Z6828 Body mass index (BMI) 28.0-28.9, adult: Secondary | ICD-10-CM

## 2015-11-06 DIAGNOSIS — Z720 Tobacco use: Secondary | ICD-10-CM | POA: Diagnosis present

## 2015-11-06 DIAGNOSIS — D638 Anemia in other chronic diseases classified elsewhere: Secondary | ICD-10-CM | POA: Diagnosis present

## 2015-11-06 DIAGNOSIS — Z992 Dependence on renal dialysis: Secondary | ICD-10-CM

## 2015-11-06 DIAGNOSIS — E1122 Type 2 diabetes mellitus with diabetic chronic kidney disease: Secondary | ICD-10-CM | POA: Diagnosis present

## 2015-11-06 DIAGNOSIS — Z809 Family history of malignant neoplasm, unspecified: Secondary | ICD-10-CM | POA: Diagnosis not present

## 2015-11-06 DIAGNOSIS — J449 Chronic obstructive pulmonary disease, unspecified: Secondary | ICD-10-CM | POA: Diagnosis present

## 2015-11-06 DIAGNOSIS — D631 Anemia in chronic kidney disease: Secondary | ICD-10-CM | POA: Diagnosis present

## 2015-11-06 DIAGNOSIS — E785 Hyperlipidemia, unspecified: Secondary | ICD-10-CM | POA: Diagnosis present

## 2015-11-06 DIAGNOSIS — T404X1A Poisoning by other synthetic narcotics, accidental (unintentional), initial encounter: Secondary | ICD-10-CM | POA: Diagnosis present

## 2015-11-06 HISTORY — DX: Family history of other specified conditions: Z84.89

## 2015-11-06 HISTORY — DX: End stage renal disease: N18.6

## 2015-11-06 HISTORY — DX: Headache, unspecified: R51.9

## 2015-11-06 HISTORY — DX: Dependence on renal dialysis: Z99.2

## 2015-11-06 HISTORY — DX: Headache: R51

## 2015-11-06 LAB — COMPREHENSIVE METABOLIC PANEL
ALBUMIN: 3.3 g/dL — AB (ref 3.5–5.0)
ALT: 23 U/L (ref 14–54)
AST: 25 U/L (ref 15–41)
Alkaline Phosphatase: 89 U/L (ref 38–126)
Anion gap: 14 (ref 5–15)
BILIRUBIN TOTAL: 0.5 mg/dL (ref 0.3–1.2)
BUN: 27 mg/dL — AB (ref 6–20)
CHLORIDE: 96 mmol/L — AB (ref 101–111)
CO2: 30 mmol/L (ref 22–32)
CREATININE: 9.85 mg/dL — AB (ref 0.44–1.00)
Calcium: 10.5 mg/dL — ABNORMAL HIGH (ref 8.9–10.3)
GFR calc Af Amer: 4 mL/min — ABNORMAL LOW (ref >60)
GFR, EST NON AFRICAN AMERICAN: 4 mL/min — AB (ref >60)
GLUCOSE: 89 mg/dL (ref 65–99)
Potassium: 4.3 mmol/L (ref 3.5–5.1)
Sodium: 140 mmol/L (ref 135–145)
Total Protein: 6.8 g/dL (ref 6.5–8.1)

## 2015-11-06 LAB — MRSA PCR SCREENING: MRSA by PCR: NEGATIVE

## 2015-11-06 LAB — CBC WITH DIFFERENTIAL/PLATELET
BASOS ABS: 0 10*3/uL (ref 0.0–0.1)
Basophils Relative: 0 %
Eosinophils Absolute: 0.1 10*3/uL (ref 0.0–0.7)
Eosinophils Relative: 1 %
HEMATOCRIT: 33.5 % — AB (ref 36.0–46.0)
Hemoglobin: 10.9 g/dL — ABNORMAL LOW (ref 12.0–15.0)
LYMPHS PCT: 14 %
Lymphs Abs: 1.4 10*3/uL (ref 0.7–4.0)
MCH: 30 pg (ref 26.0–34.0)
MCHC: 32.5 g/dL (ref 30.0–36.0)
MCV: 92.3 fL (ref 78.0–100.0)
Monocytes Absolute: 0.5 10*3/uL (ref 0.1–1.0)
Monocytes Relative: 5 %
NEUTROS ABS: 8 10*3/uL — AB (ref 1.7–7.7)
Neutrophils Relative %: 80 %
PLATELETS: 251 10*3/uL (ref 150–400)
RBC: 3.63 MIL/uL — AB (ref 3.87–5.11)
RDW: 16.2 % — ABNORMAL HIGH (ref 11.5–15.5)
WBC: 9.9 10*3/uL (ref 4.0–10.5)

## 2015-11-06 LAB — SALICYLATE LEVEL: Salicylate Lvl: <4 mg/dL (ref 2.8–30.0)

## 2015-11-06 LAB — ETHANOL

## 2015-11-06 MED ORDER — SODIUM CHLORIDE 0.9 % IV SOLN
1500.0000 mg | Freq: Once | INTRAVENOUS | Status: AC
  Administered 2015-11-06: 1500 mg via INTRAVENOUS
  Filled 2015-11-06: qty 1500

## 2015-11-06 MED ORDER — SEVELAMER CARBONATE 800 MG PO TABS
2400.0000 mg | ORAL_TABLET | Freq: Three times a day (TID) | ORAL | Status: DC
  Administered 2015-11-07 – 2015-11-09 (×6): 2400 mg via ORAL
  Filled 2015-11-06 (×7): qty 3

## 2015-11-06 MED ORDER — TRAMADOL HCL 50 MG PO TABS
50.0000 mg | ORAL_TABLET | Freq: Every day | ORAL | Status: DC | PRN
  Administered 2015-11-07: 50 mg via ORAL
  Filled 2015-11-06: qty 1

## 2015-11-06 MED ORDER — CINACALCET HCL 30 MG PO TABS
60.0000 mg | ORAL_TABLET | Freq: Every day | ORAL | Status: DC
  Administered 2015-11-06 – 2015-11-08 (×3): 60 mg via ORAL
  Filled 2015-11-06 (×4): qty 2

## 2015-11-06 MED ORDER — AMLODIPINE BESYLATE 10 MG PO TABS
10.0000 mg | ORAL_TABLET | Freq: Every day | ORAL | Status: DC
  Administered 2015-11-06 – 2015-11-08 (×3): 10 mg via ORAL
  Filled 2015-11-06 (×3): qty 1

## 2015-11-06 MED ORDER — ONDANSETRON HCL 4 MG/2ML IJ SOLN
4.0000 mg | Freq: Once | INTRAMUSCULAR | Status: AC
  Administered 2015-11-06: 4 mg via INTRAVENOUS
  Filled 2015-11-06: qty 2

## 2015-11-06 MED ORDER — CEFTAZIDIME 2 G IJ SOLR: 2.0000 g | Freq: Three times a day (TID) | INTRAMUSCULAR | Status: DC

## 2015-11-06 MED ORDER — IPRATROPIUM-ALBUTEROL 0.5-2.5 (3) MG/3ML IN SOLN
3.0000 mL | Freq: Four times a day (QID) | RESPIRATORY_TRACT | Status: DC
  Administered 2015-11-07 – 2015-11-08 (×6): 3 mL via RESPIRATORY_TRACT
  Filled 2015-11-06 (×7): qty 3

## 2015-11-06 MED ORDER — ACETAMINOPHEN 325 MG PO TABS: 650.0000 mg | ORAL_TABLET | Freq: Four times a day (QID) | ORAL | Status: DC | PRN

## 2015-11-06 MED ORDER — DEXTROSE 5 % IV SOLN
2.0000 g | INTRAVENOUS | Status: DC
  Administered 2015-11-07: 2 g via INTRAVENOUS
  Filled 2015-11-06 (×2): qty 2

## 2015-11-06 MED ORDER — VANCOMYCIN HCL IN DEXTROSE 750-5 MG/150ML-% IV SOLN
750.0000 mg | INTRAVENOUS | Status: DC
  Administered 2015-11-07: 750 mg via INTRAVENOUS
  Filled 2015-11-06 (×2): qty 150

## 2015-11-06 MED ORDER — IBUPROFEN 600 MG PO TABS
600.0000 mg | ORAL_TABLET | Freq: Four times a day (QID) | ORAL | Status: DC | PRN
  Administered 2015-11-07 – 2015-11-09 (×2): 600 mg via ORAL
  Filled 2015-11-06 (×2): qty 1

## 2015-11-06 MED ORDER — CEFTAZIDIME 2 G IJ SOLR
2.0000 g | INTRAMUSCULAR | Status: AC
  Administered 2015-11-06: 2 g via INTRAVENOUS
  Filled 2015-11-06: qty 2

## 2015-11-06 MED ORDER — SODIUM CHLORIDE 0.9 % IV SOLN
1000.0000 mL | INTRAVENOUS | Status: AC
  Administered 2015-11-06: 1000 mL via INTRAVENOUS

## 2015-11-06 MED ORDER — NICOTINE 14 MG/24HR TD PT24
14.0000 mg | MEDICATED_PATCH | Freq: Every day | TRANSDERMAL | Status: DC
  Administered 2015-11-06 – 2015-11-09 (×4): 14 mg via TRANSDERMAL
  Filled 2015-11-06 (×5): qty 1

## 2015-11-06 MED ORDER — CETYLPYRIDINIUM CHLORIDE 0.05 % MT LIQD
7.0000 mL | Freq: Two times a day (BID) | OROMUCOSAL | Status: DC
  Administered 2015-11-07 – 2015-11-09 (×4): 7 mL via OROMUCOSAL

## 2015-11-06 MED ORDER — HEPARIN SODIUM (PORCINE) 5000 UNIT/ML IJ SOLN
5000.0000 [IU] | Freq: Three times a day (TID) | INTRAMUSCULAR | Status: DC
  Administered 2015-11-06 – 2015-11-09 (×7): 5000 [IU] via SUBCUTANEOUS
  Filled 2015-11-06 (×7): qty 1

## 2015-11-06 MED ORDER — DIALYVITE 3000 3 MG PO TABS: 1.0000 | ORAL_TABLET | Freq: Every day | ORAL | Status: DC

## 2015-11-06 MED ORDER — GABAPENTIN 300 MG PO CAPS
300.0000 mg | ORAL_CAPSULE | Freq: Every day | ORAL | Status: DC
  Administered 2015-11-06 – 2015-11-07 (×2): 300 mg via ORAL
  Filled 2015-11-06 (×2): qty 1

## 2015-11-06 MED ORDER — LOSARTAN POTASSIUM 50 MG PO TABS
100.0000 mg | ORAL_TABLET | Freq: Every day | ORAL | Status: DC
  Administered 2015-11-06 – 2015-11-08 (×3): 100 mg via ORAL
  Filled 2015-11-06 (×3): qty 2

## 2015-11-06 MED ORDER — NEPRO/CARBSTEADY PO LIQD
237.0000 mL | Freq: Two times a day (BID) | ORAL | Status: DC
  Administered 2015-11-09: 237 mL via ORAL

## 2015-11-06 MED ORDER — SODIUM CHLORIDE 0.9 % IV SOLN
1000.0000 mL | INTRAVENOUS | Status: DC
  Administered 2015-11-06: 1000 mL via INTRAVENOUS

## 2015-11-06 MED ORDER — GUAIFENESIN ER 600 MG PO TB12
600.0000 mg | ORAL_TABLET | Freq: Two times a day (BID) | ORAL | Status: DC
  Administered 2015-11-06 – 2015-11-09 (×6): 600 mg via ORAL
  Filled 2015-11-06 (×7): qty 1

## 2015-11-06 MED ORDER — PRAVASTATIN SODIUM 20 MG PO TABS
20.0000 mg | ORAL_TABLET | Freq: Every day | ORAL | Status: DC
  Administered 2015-11-06 – 2015-11-08 (×3): 20 mg via ORAL
  Filled 2015-11-06 (×3): qty 1

## 2015-11-06 MED ORDER — ADULT MULTIVITAMIN W/MINERALS CH
1.0000 | ORAL_TABLET | Freq: Every day | ORAL | Status: DC
  Administered 2015-11-07 – 2015-11-09 (×3): 1 via ORAL
  Filled 2015-11-06 (×4): qty 1

## 2015-11-06 NOTE — ED Notes (Signed)
PT STATES SHE DOESN'T MAKE MUCH URINE.

## 2015-11-06 NOTE — ED Notes (Signed)
Pt is aware urine is needed, pt states she is usually only urinates once a day due to dialysis.

## 2015-11-06 NOTE — ED Notes (Signed)
Pt states that she woke this morning and took 8 pills that she thought was her daily medications at 2am. Pt states that after returning home this afternoon she realized that the bottle was her tramadol. Pt reports nausea, vomiting, and shaking.

## 2015-11-06 NOTE — ED Notes (Signed)
Patient transported to X-ray 

## 2015-11-06 NOTE — Progress Notes (Signed)
ANTIBIOTIC CONSULT NOTE - INITIAL  Pharmacy Consult for Vancomycin Indication: rule out pneumonia  Allergies  Allergen Reactions  . Ace Inhibitors Anaphylaxis, Shortness Of Breath and Swelling    Tongue and throat swells, cannot breathe    Patient Measurements: Height: 5\' 3"  (160 cm) Weight: 165 lb 5.5 oz (75 kg) IBW/kg (Calculated) : 52.4 Adjusted Body Weight:   Vital Signs: Temp: 98 F (36.7 C) (11/10 1503) Temp Source: Oral (11/10 1503) BP: 165/84 mmHg (11/10 1745) Pulse Rate: 79 (11/10 1745) Intake/Output from previous day:   Intake/Output from this shift:    Labs:  Recent Labs  11/06/15 1621  WBC 9.9  HGB 10.9*  PLT 251  CREATININE 9.85*   CrCl cannot be calculated (Unknown ideal weight.). No results for input(s): VANCOTROUGH, VANCOPEAK, VANCORANDOM, GENTTROUGH, GENTPEAK, GENTRANDOM, TOBRATROUGH, TOBRAPEAK, TOBRARND, AMIKACINPEAK, AMIKACINTROU, AMIKACIN in the last 72 hours.   Microbiology: No results found for this or any previous visit (from the past 720 hour(s)).  Medical History: Past Medical History  Diagnosis Date  . Hypertension   . Hyperlipidemia   . Obesity   . Chronic kidney disease   . Fibrosis of uterus   . Membranoproliferative glomerulonephritis     type 1  . Anemia   . Chronic bronchitis   . Degenerative joint disease   . Hyperparathyroidism, secondary renal ([place])   . ESRD (end stage renal disease) ([place])   . NEPHROTIC SYNDROME 05/07/2010    Qualifier: Diagnosis of  By: Hollie Salk CMA, [name]    . Shortness of breath dyspnea   . Pneumonia 06/09/2015  . Diabetes mellitus     reports resolved  . Depression   . GERD (gastroesophageal reflux disease)   . Headache     Medications:   (Not in a hospital admission) Scheduled:   Infusions:  . sodium chloride 1,000 mL (11/06/15 1635)  . cefTAZidime (FORTAZ)  IV     Assessment: 59yo female with history of ESRD MWF (last session 11/9), HTN, and HLD presents with suspected unintentional  overdose, cough and SOB. Pharmacy is consulted to dose vancomycin for suspected pneumonia. WBC 9.9, sCr 9.85,   Goal of Therapy:  Vancomycin trough level 15-20 mcg/ml  Plan:  Vancomycin 1500mg  IV once followed by 750mg  qHD Ceftazidime 2g IV once followed by 2g qHD Expected duration 7 days with resolution of temperature and/or normalization of WBC Measure antibiotic drug levels at steady state Follow up culture results, HD plan and clinical course  Andrey Cota. Diona Foley, PharmD Clinical Pharmacist Pager [phone] 11/06/2015,6:59 PM

## 2015-11-06 NOTE — ED Provider Notes (Signed)
CSN: [id number]     Arrival date & time 11/06/15  1449 History   First MD Initiated Contact with Patient 11/06/15 1516     Chief Complaint  Patient presents with  . Drug Overdose     (Consider location/radiation/quality/duration/timing/severity/associated sxs/prior Treatment) HPI Patient presents with concern of nausea, vomiting. Symptoms began within the past 12 hours. Patient has history of hypertension, end-stage renal disease. She also has a history of chronic neck pain. She states that about 14 hours ago, she accidentally ingested 8 tramadol tablets, while trying to take her nightly medication. Currently she denies chest pain, dyspnea, confusion, disorientation, abdominal pain. She also denies lightheadedness, syncope. She does acknowledge ongoing cough, congestion, present for about one month. Last dialysis yesterday, no complications, full session.   She explicitly denies any suicidal ideation, homicidal ideation, depression.  Past Medical History  Diagnosis Date  . Hypertension   . Hyperlipidemia   . Obesity   . Chronic kidney disease   . Fibrosis of uterus   . Membranoproliferative glomerulonephritis     type 1  . Anemia   . Chronic bronchitis   . Degenerative joint disease   . Hyperparathyroidism, secondary renal ([place])   . ESRD (end stage renal disease) ([place])   . NEPHROTIC SYNDROME 05/07/2010    Qualifier: Diagnosis of  By: Hollie Salk CMA, [name]    . Shortness of breath dyspnea   . Pneumonia 06/09/2015  . Diabetes mellitus     reports resolved  . Depression   . GERD (gastroesophageal reflux disease)   . Headache    Past Surgical History  Procedure Laterality Date  . Av fistula placement  01/02/10  . Abdominal hysterectomy    . Knee arthroscopy  right knee 2006  . Esophagogastroduodenoscopy      Dr Lajoyce Corners  . Colonoscopy      Dr Lajoyce Corners   Family History  Problem Relation Age of Onset  . Cancer Mother     breast  . Stroke Father    Social History  Substance  Use Topics  . Smoking status: Current Some Day Smoker -- 0.25 packs/day for 40 years    Types: Cigarettes  . Smokeless tobacco: Never Used  . Alcohol Use: No   OB History    No data available     Review of Systems  Constitutional:       Per HPI, otherwise negative  HENT:       Per HPI, otherwise negative  Respiratory:       Per HPI, otherwise negative  Cardiovascular:       Per HPI, otherwise negative  Gastrointestinal: Positive for nausea and vomiting. Negative for abdominal pain.  Endocrine:       Negative aside from HPI  Genitourinary:       Neg aside from HPI   Musculoskeletal:       Per HPI, otherwise negative  Skin: Negative.   Neurological: Negative for syncope.  Psychiatric/Behavioral: Negative for suicidal ideas, sleep disturbance and self-injury. The patient is not nervous/anxious.       Allergies  Ace inhibitors  Home Medications   Prior to Admission medications   Medication Sig Start Date End Date Taking? Authorizing Provider  acetaminophen (TYLENOL) 500 MG tablet Take 1,500 mg by mouth every 8 (eight) hours as needed for moderate pain or headache.    Yes Historical Provider, MD  albuterol (PROVENTIL HFA;VENTOLIN HFA) 108 (90 BASE) MCG/ACT inhaler Inhale 1-2 puffs into the lungs every 6 (six) hours as needed for  wheezing or shortness of breath. 08/11/14  Yes Harden Mo, MD  amLODipine (NORVASC) 10 MG tablet Take 10 mg by mouth at bedtime.    Yes Historical Provider, MD  cinacalcet (SENSIPAR) 60 MG tablet Take 60 mg by mouth at bedtime.    Yes Historical Provider, MD  folic acid-vitamin b complex-vitamin c-selenium-zinc (DIALYVITE) 3 MG TABS Take 1 tablet by mouth at bedtime.    Yes Historical Provider, MD  gabapentin (NEURONTIN) 300 MG capsule Take 300 mg by mouth at bedtime.    Yes Historical Provider, MD  ibuprofen (ADVIL,MOTRIN) 200 MG tablet Take 600 mg by mouth every 6 (six) hours as needed for moderate pain.   Yes Historical Provider, MD  losartan  (COZAAR) 100 MG tablet Take 100 mg by mouth at bedtime.    Yes Historical Provider, MD  pravastatin (PRAVACHOL) 20 MG tablet Take 20 mg by mouth at bedtime.    Yes Historical Provider, MD  sevelamer carbonate (RENVELA) 800 MG tablet Take 2,400 mg by mouth 3 (three) times daily with meals.   Yes Historical Provider, MD  traMADol (ULTRAM) 50 MG tablet Take 50 mg by mouth daily as needed for moderate pain or severe pain.  10/09/15  Yes Historical Provider, MD   BP 165/84 mmHg  Pulse 79  Temp(Src) 98 F (36.7 C) (Oral)  Resp 19  SpO2 99% Physical Exam  Constitutional: She is oriented to person, place, and time. She appears well-developed and well-nourished. No distress.  HENT:  Head: Normocephalic and atraumatic.  Eyes: Conjunctivae and EOM are normal.  Cardiovascular: Normal rate and regular rhythm.   Pulmonary/Chest: Effort normal and breath sounds normal. No stridor. No respiratory distress.  Abdominal: She exhibits no distension. There is no tenderness. There is no rebound.  Musculoskeletal: She exhibits no edema.  Left upper extremity with 2 scars.  one old, one new, with palpable thrill in dialysis fistula  Neurological: She is alert and oriented to person, place, and time. No cranial nerve deficit.  Skin: Skin is warm and dry.  Psychiatric: She has a normal mood and affect.  Nursing note and vitals reviewed.   ED Course  Procedures (including critical care time) Labs Review Labs Reviewed  CBC WITH DIFFERENTIAL/PLATELET - Abnormal; Notable for the following:    RBC 3.63 (*)    Hemoglobin 10.9 (*)    HCT 33.5 (*)    RDW 16.2 (*)    Neutro Abs 8.0 (*)    All other components within normal limits  COMPREHENSIVE METABOLIC PANEL - Abnormal; Notable for the following:    Chloride 96 (*)    BUN 27 (*)    Creatinine, Ser 9.85 (*)    Calcium 10.5 (*)    Albumin 3.3 (*)    GFR calc non Af Amer 4 (*)    GFR calc Af Amer 4 (*)    All other components within normal limits   ETHANOL  SALICYLATE LEVEL  URINE RAPID DRUG SCREEN, HOSP PERFORMED  URINALYSIS, ROUTINE W REFLEX MICROSCOPIC (NOT AT [facility])    Imaging Review Dg Chest 2 View  11/06/2015  CLINICAL DATA:  59 year old with 1 day history of cough and shortness of breath. Current history end-stage renal disease on hemodialysis. EXAM: CHEST  2 VIEW COMPARISON:  06/10/2015 and earlier. FINDINGS: AP erect and lateral images were obtained. Cardiac silhouette mildly enlarged but stable, allowing for differences in technique. Thoracic aorta tortuous and atherosclerotic, unchanged. Hilar and mediastinal contours otherwise unremarkable. Streaky and patchy airspace opacities in  the left lower lobe. Lungs otherwise clear. Mild pulmonary venous hypertension without overt edema. No pleural effusions. Degenerative changes involving the lumbar spine. IMPRESSION: 1. Left lower lobe atelectasis versus bronchopneumonia. Bronchopneumonia is favored. 2. Stable cardiomegaly. Pulmonary venous hypertension without overt edema. Electronically Signed   By: Evangeline Dakin M.D.   On: 11/06/2015 18:32   I have personally reviewed and evaluated these images and lab results as part of my medical decision-making.   EKG Interpretation   Date/Time:  Thursday November 06 2015 15:06:24 EST Ventricular Rate:  83 PR Interval:  218 QRS Duration: 74 QT Interval:  398 QTC Calculation: 467 R Axis:   50 Text Interpretation:  Sinus rhythm with 1st degree A-V block Cannot rule  out Anterior infarct , age undetermined Abnormal ECG Sinus rhythm with 1st  degree A-V block Artifact Abnormal ekg Confirmed by Carmin Muskrat  MD  [id number]) on 11/06/2015 3:18:20 PM       6:50 PM On repeat exam the patient is coughing. She is using nasal cannula for comfort. We discussed all findings at length. Patient will be started on antibiotics, admitted for further evaluation. MDM  Patient presents with concern of possible unintentional overdose, but also  ongoing concern for cough, mild dyspnea. Patient is on dialysis. Patient's findings are reassuring, with low evidence for substantial sequelae of overdose. However, the patient does have evidence for pneumonia. Patient required admission, after initial antibiotics  Carmin Muskrat, MD 11/06/15 1851

## 2015-11-07 DIAGNOSIS — N186 End stage renal disease: Secondary | ICD-10-CM

## 2015-11-07 DIAGNOSIS — Z720 Tobacco use: Secondary | ICD-10-CM

## 2015-11-07 DIAGNOSIS — E785 Hyperlipidemia, unspecified: Secondary | ICD-10-CM

## 2015-11-07 DIAGNOSIS — T50901A Poisoning by unspecified drugs, medicaments and biological substances, accidental (unintentional), initial encounter: Secondary | ICD-10-CM

## 2015-11-07 DIAGNOSIS — T50901D Poisoning by unspecified drugs, medicaments and biological substances, accidental (unintentional), subsequent encounter: Secondary | ICD-10-CM

## 2015-11-07 DIAGNOSIS — J189 Pneumonia, unspecified organism: Principal | ICD-10-CM

## 2015-11-07 DIAGNOSIS — I10 Essential (primary) hypertension: Secondary | ICD-10-CM

## 2015-11-07 DIAGNOSIS — Z992 Dependence on renal dialysis: Secondary | ICD-10-CM

## 2015-11-07 LAB — CBC WITH DIFFERENTIAL/PLATELET
BASOS ABS: 0.1 10*3/uL (ref 0.0–0.1)
Basophils Relative: 1 %
Eosinophils Absolute: 0.2 10*3/uL (ref 0.0–0.7)
Eosinophils Relative: 2 %
HEMATOCRIT: 31.6 % — AB (ref 36.0–46.0)
HEMOGLOBIN: 10.1 g/dL — AB (ref 12.0–15.0)
LYMPHS ABS: 1.9 10*3/uL (ref 0.7–4.0)
LYMPHS PCT: 20 %
MCH: 29.7 pg (ref 26.0–34.0)
MCHC: 32 g/dL (ref 30.0–36.0)
MCV: 92.9 fL (ref 78.0–100.0)
Monocytes Absolute: 0.5 10*3/uL (ref 0.1–1.0)
Monocytes Relative: 6 %
NEUTROS ABS: 6.8 10*3/uL (ref 1.7–7.7)
NEUTROS PCT: 71 %
PLATELETS: 239 10*3/uL (ref 150–400)
RBC: 3.4 MIL/uL — AB (ref 3.87–5.11)
RDW: 16.7 % — ABNORMAL HIGH (ref 11.5–15.5)
WBC: 9.5 10*3/uL (ref 4.0–10.5)

## 2015-11-07 LAB — RENAL FUNCTION PANEL
ANION GAP: 11 (ref 5–15)
Albumin: 3.1 g/dL — ABNORMAL LOW (ref 3.5–5.0)
BUN: 33 mg/dL — ABNORMAL HIGH (ref 6–20)
CHLORIDE: 100 mmol/L — AB (ref 101–111)
CO2: 30 mmol/L (ref 22–32)
CREATININE: 10.54 mg/dL — AB (ref 0.44–1.00)
Calcium: 9.3 mg/dL (ref 8.9–10.3)
GFR, EST AFRICAN AMERICAN: 4 mL/min — AB (ref >60)
GFR, EST NON AFRICAN AMERICAN: 3 mL/min — AB (ref >60)
Glucose, Bld: 79 mg/dL (ref 65–99)
POTASSIUM: 4.3 mmol/L (ref 3.5–5.1)
Phosphorus: 6.3 mg/dL — ABNORMAL HIGH (ref 2.5–4.6)
Sodium: 141 mmol/L (ref 135–145)

## 2015-11-07 LAB — TROPONIN I
TROPONIN I: 0.04 ng/mL — AB (ref <0.031)
Troponin I: 0.05 ng/mL — ABNORMAL HIGH (ref <0.031)

## 2015-11-07 LAB — EXPECTORATED SPUTUM ASSESSMENT W REFEX TO RESP CULTURE

## 2015-11-07 LAB — PROCALCITONIN: PROCALCITONIN: 0.28 ng/mL

## 2015-11-07 LAB — EXPECTORATED SPUTUM ASSESSMENT W GRAM STAIN, RFLX TO RESP C

## 2015-11-07 MED ORDER — HEPARIN SODIUM (PORCINE) 1000 UNIT/ML DIALYSIS: 1000.0000 [IU] | INTRAMUSCULAR | Status: DC | PRN

## 2015-11-07 MED ORDER — OXYCODONE HCL 5 MG PO TABS
5.0000 mg | ORAL_TABLET | Freq: Once | ORAL | Status: AC
  Administered 2015-11-07: 5 mg via ORAL

## 2015-11-07 MED ORDER — PENTAFLUOROPROP-TETRAFLUOROETH EX AERO: 1.0000 "application " | INHALATION_SPRAY | CUTANEOUS | Status: DC | PRN

## 2015-11-07 MED ORDER — LIDOCAINE-PRILOCAINE 2.5-2.5 % EX CREA
1.0000 "application " | TOPICAL_CREAM | CUTANEOUS | Status: DC | PRN
  Filled 2015-11-07: qty 5

## 2015-11-07 MED ORDER — OXYCODONE HCL 5 MG PO TABS
ORAL_TABLET | ORAL | Status: AC
  Filled 2015-11-07: qty 1

## 2015-11-07 MED ORDER — HEPARIN SODIUM (PORCINE) 1000 UNIT/ML DIALYSIS: 4000.0000 [IU] | INTRAMUSCULAR | Status: DC | PRN

## 2015-11-07 MED ORDER — SODIUM CHLORIDE 0.9 % IV SOLN: 100.0000 mL | INTRAVENOUS | Status: DC | PRN

## 2015-11-07 MED ORDER — DARBEPOETIN ALFA 60 MCG/0.3ML IJ SOSY: 60.0000 ug | PREFILLED_SYRINGE | INTRAMUSCULAR | Status: DC

## 2015-11-07 MED ORDER — DOXERCALCIFEROL 4 MCG/2ML IV SOLN: 2.0000 ug | INTRAVENOUS | Status: DC

## 2015-11-07 MED ORDER — LIDOCAINE HCL (PF) 1 % IJ SOLN: 5.0000 mL | INTRAMUSCULAR | Status: DC | PRN

## 2015-11-07 MED ORDER — ALTEPLASE 2 MG IJ SOLR
2.0000 mg | Freq: Once | INTRAMUSCULAR | Status: DC | PRN
  Filled 2015-11-07: qty 2

## 2015-11-07 NOTE — H&P (Addendum)
[facility] History and Physical  Kim Hodge [id number] DOB: [birth year] DOA: 11/06/2015  Referring physician: ED PCP: Hoyt Koch, MD   Chief Complaint: Nausea vomiting  HPI:   59 year old female with a past medical history significant for ESRD on HD M/W/F, hypertension, hyperlipidemia, obesity, tobacco abuse, who presents after unintentional overdose of tramadol with nausea and vomiting. History is obtained by the patient who is a fair historian she reports that around 2 AM last night that she accidentally took 8 to 9 pills of tramadol. Normally the patient takes six pills at night,  however she had mixed up her pain medication bottle with her other pill dispenser for her dialysis medications. She realize this mistake on this morning and noted feeling nauseous, vomiting at least once and having the shakes. Furthermore, she expresses being sick for approximately two weeks noting a productive cough with the whitish to brown sputum. She notes associated symptoms of wheezing with intermittent chest pain subject fever, chills, and sweats she tried Robitussin and Tess and cough syrup along with equate night and day over the counter syrup without relief of symptoms. Fun omission into the emergency department she is found to have a left lower lobe opacity that was suggestive of a pna vs atecletasis     Review of Systems  Constitutional: Positive for fever, chills and diaphoresis.  HENT: Negative for ear pain.   Eyes: Negative for double vision and photophobia.  Respiratory: Positive for cough, sputum production and shortness of breath.   Cardiovascular: Positive for chest pain. Negative for leg swelling.  Gastrointestinal: Positive for nausea and vomiting. Negative for abdominal pain.  Genitourinary: Negative for urgency and frequency.  Musculoskeletal: Positive for joint pain. Negative for falls.  Neurological: Negative for dizziness, tremors and headaches.   Endo/Heme/Allergies: Positive for environmental allergies. Negative for polydipsia.  Psychiatric/Behavioral: Negative for hallucinations and memory loss.     Past Medical History  Diagnosis Date  . Hypertension   . Hyperlipidemia   . Obesity   . Fibrosis of uterus   . Anemia   . Chronic bronchitis   . Hyperparathyroidism, secondary renal ([place])   . Shortness of breath dyspnea   . Depression   . GERD (gastroesophageal reflux disease)   . Family history of adverse reaction to anesthesia     "sister took a long long time to wake up"  . Membranoproliferative glomerulonephritis     type 1  . ESRD (end stage renal disease) on dialysis [facility])     "[place]; off [employer]; MWF" (11/06/2015)  . NEPHROTIC SYNDROME 05/07/2010    Qualifier: Diagnosis of  By: Hollie Salk CMA, [name]    . Pneumonia ?01/2015; 06/09/2015; 11/06/2015  . Diabetes mellitus     "only when I take steroids" (11/06/2015)  . Daily headache   . Degenerative joint disease     "right knee" (11/06/2015)     Past Surgical History  Procedure Laterality Date  . Av fistula placement Left 01/02/10    upper arm; "only lasted 2 months"  . Knee arthroscopy Right 2006  . Esophagogastroduodenoscopy      Dr Lajoyce Corners  . Colonoscopy      Dr Lajoyce Corners  . Vaginal hysterectomy  2011  . Arteriovenous graft placement Left 02/2010    upper arm; wrapped it around the fistula"      Social History:  reports that she has been smoking Cigarettes.  She has a 14.52 pack-year smoking history. She has never used smokeless tobacco. She reports  that she uses illicit drugs (Marijuana). She reports that she does not drink alcohol. where does patient live--home and with whom if at home? Alone Can patient participate in ADLs? Yes  Allergies  Allergen Reactions  . Ace Inhibitors Anaphylaxis, Shortness Of Breath and Swelling    Tongue and throat swells, cannot breathe    Family History  Problem Relation Age of Onset  . Cancer Mother      breast  . Stroke Father       Prior to Admission medications   Medication Sig Start Date End Date Taking? Authorizing Provider  acetaminophen (TYLENOL) 500 MG tablet Take 1,500 mg by mouth every 8 (eight) hours as needed for moderate pain or headache.    Yes Historical Provider, MD  albuterol (PROVENTIL HFA;VENTOLIN HFA) 108 (90 BASE) MCG/ACT inhaler Inhale 1-2 puffs into the lungs every 6 (six) hours as needed for wheezing or shortness of breath. 08/11/14  Yes Harden Mo, MD  amLODipine (NORVASC) 10 MG tablet Take 10 mg by mouth at bedtime.    Yes Historical Provider, MD  cinacalcet (SENSIPAR) 60 MG tablet Take 60 mg by mouth at bedtime.    Yes Historical Provider, MD  folic acid-vitamin b complex-vitamin c-selenium-zinc (DIALYVITE) 3 MG TABS Take 1 tablet by mouth at bedtime.    Yes Historical Provider, MD  gabapentin (NEURONTIN) 300 MG capsule Take 300 mg by mouth at bedtime.    Yes Historical Provider, MD  ibuprofen (ADVIL,MOTRIN) 200 MG tablet Take 600 mg by mouth every 6 (six) hours as needed for moderate pain.   Yes Historical Provider, MD  losartan (COZAAR) 100 MG tablet Take 100 mg by mouth at bedtime.    Yes Historical Provider, MD  pravastatin (PRAVACHOL) 20 MG tablet Take 20 mg by mouth at bedtime.    Yes Historical Provider, MD  sevelamer carbonate (RENVELA) 800 MG tablet Take 2,400 mg by mouth 3 (three) times daily with meals.   Yes Historical Provider, MD  traMADol (ULTRAM) 50 MG tablet Take 50 mg by mouth daily as needed for moderate pain or severe pain.  10/09/15  Yes Historical Provider, MD     Physical Exam: Filed Vitals:   11/06/15 1900 11/06/15 1930 11/06/15 2028 11/06/15 2223  BP:  161/81 173/95 154/99  Pulse:  79 77   Temp:   97.9 F (36.6 C)   TempSrc:   Oral   Resp:  10 18   Height: 5\' 3"  (1.6 m)  5\' 3"  (1.6 m)   Weight: 75 kg (165 lb 5.5 oz)  72.303 kg (159 lb 6.4 oz)   SpO2:  99% 100%      Constitutional: Vital signs reviewed. Patient is a  ill-appearing, but nontoxic. Alert and oriented x3.  Head: Normocephalic and atraumatic  Ear: TM normal bilaterally  Mouth: no erythema or exudates, MMM  Eyes: PERRL, EOMI, conjunctivae normal, No scleral icterus.  Neck: Supple, Trachea midline normal ROM, No JVD, mass, thyromegaly, or carotid bruit present.  Cardiovascular: RRR, S1 normal, S2 normal, no MRG, pulses symmetric and intact bilaterally  Pulmonary/Chest: Clear to auscultation with patient frequent coughing throughout exam. Patient bringing up whitish sputum Abdominal: Soft. Non-tender, non-distended, bowel sounds are normal, no masses, organomegaly, or guarding present.  GU: no CVA tenderness Musculoskeletal: No joint deformities, erythema, or stiffness, ROM full and no nontender Ext: Left upper extremity fistula with palpable thrill no edema and no cyanosis, pulses palpable bilaterally (DP and PT)  Hematology: no cervical, inginal, or axillary adenopathy.  Neurological: A&O x3, Strenght is normal and symmetric bilaterally, cranial nerve II-XII are grossly intact, no focal motor deficit, sensory intact to light touch bilaterally.  Skin: Warm, dry and intact. No rash, cyanosis, or clubbing.  Psychiatric: Normal mood and affect. speech and behavior is normal. Judgment and thought content normal. Cognition and memory are normal.      Data Review   Micro Results Recent Results (from the past 240 hour(s))  MRSA PCR Screening     Status: None   Collection Time: 11/06/15  9:38 PM  Result Value Ref Range Status   MRSA by PCR NEGATIVE NEGATIVE Final    Comment:        The GeneXpert MRSA Assay (FDA approved for NASAL specimens only), is one component of a comprehensive MRSA colonization surveillance program. It is not intended to diagnose MRSA infection nor to guide or monitor treatment for MRSA infections.     Radiology Reports Dg Chest 2 View  11/06/2015  CLINICAL DATA:  59 year old with 1 day history of cough and  shortness of breath. Current history end-stage renal disease on hemodialysis. EXAM: CHEST  2 VIEW COMPARISON:  06/10/2015 and earlier. FINDINGS: AP erect and lateral images were obtained. Cardiac silhouette mildly enlarged but stable, allowing for differences in technique. Thoracic aorta tortuous and atherosclerotic, unchanged. Hilar and mediastinal contours otherwise unremarkable. Streaky and patchy airspace opacities in the left lower lobe. Lungs otherwise clear. Mild pulmonary venous hypertension without overt edema. No pleural effusions. Degenerative changes involving the lumbar spine. IMPRESSION: 1. Left lower lobe atelectasis versus bronchopneumonia. Bronchopneumonia is favored. 2. Stable cardiomegaly. Pulmonary venous hypertension without overt edema. Electronically Signed   By: Evangeline Dakin M.D.   On: 11/06/2015 18:32     CBC  Recent Labs Lab 11/06/15 1621  WBC 9.9  HGB 10.9*  HCT 33.5*  PLT 251  MCV 92.3  MCH 30.0  MCHC 32.5  RDW 16.2*  LYMPHSABS 1.4  MONOABS 0.5  EOSABS 0.1  BASOSABS 0.0    Chemistries   Recent Labs Lab 11/06/15 1621  NA 140  K 4.3  CL 96*  CO2 30  GLUCOSE 89  BUN 27*  CREATININE 9.85*  CALCIUM 10.5*  AST 25  ALT 23  ALKPHOS 89  BILITOT 0.5   ------------------------------------------------------------------------------------------------------------------ estimated creatinine clearance is 5.8 mL/min (by C-G formula based on Cr of 9.85). ------------------------------------------------------------------------------------------------------------------ No results for input(s): HGBA1C in the last 72 hours. ------------------------------------------------------------------------------------------------------------------ No results for input(s): CHOL, HDL, LDLCALC, TRIG, CHOLHDL, LDLDIRECT in the last 72 hours. ------------------------------------------------------------------------------------------------------------------ No results for  input(s): TSH, T4TOTAL, T3FREE, THYROIDAB in the last 72 hours.  Invalid input(s): FREET3 ------------------------------------------------------------------------------------------------------------------ No results for input(s): VITAMINB12, FOLATE, FERRITIN, TIBC, IRON, RETICCTPCT in the last 72 hours.  Coagulation profile No results for input(s): INR, PROTIME in the last 168 hours.  No results for input(s): DDIMER in the last 72 hours.  Cardiac Enzymes No results for input(s): CKMB, TROPONINI, MYOGLOBIN in the last 168 hours.  Invalid input(s): CK ------------------------------------------------------------------------------------------------------------------ Invalid input(s): POCBNP   CBG: No results for input(s): GLUCAP in the last 168 hours.     EKG: Independently reviewed. Sinus rhythm with first-degree AV block QTC 467.   Assessment/Plan Principal Problem:   Healthcare-associated pneumonia: Patient immunocompromised on dialysis. 1-2 week of increase shortness of breath with cough,  sputum production, increase SOB on exertion. febrile at home. CXRay shows patchy lower lobe lobe opacity, suspicious for pneumonia. Will treat for presumptive HCAP. -Admit to regular floor med-surg -CMP, CBC, Blood cultures x  2, Differential, Legionella urine antigen, Strep Pneumo urine antigen  -duonebs Q4HPRN  -ABX Fortaz, Vancomycin  -[place] prn to keep o2 sats >92% -12 lead EKG -Mucinex  Accidental overdose of tramadol. Acute appears to have been unintentional patient only took 8-9 pills of tramadol. Patient went temporary nausea or vomiting now resolved. -Counseled patient on need of being more careful with medications.  - prn zofran for nausea  End stage renal disease on dialysis [facility]): Patient dialyzes M/W/F. Patient's BUN 27 creatinine 9.85 -Consult nephrology and a.m. to continue patient's normal dialysis  -Continue Renvela, Nepro Carb, sensipar    Essential hypertension -  Continue Cozaar and amlodipine  Hyperlipidemia -Continue pravastatin  Tobacco abuse patient states she only smokes one third pack per day. -Counseled patient on the need of cessation of tobacco offered nicotine patch  Anemia of chronic disease. Chronic. hemoglobin 10.9 and hematocrit 33.5    Code Status:   full Family Communication: bedside Disposition Plan: admit   Total time spent 55 minutes.Greater than 50% of this time was spent in counseling, explanation of diagnosis, planning of further management, and coordination of care  [facility] Pager [phone]  If 7PM-7AM, please contact night-coverage [email or web address] Password [email or web address] 11/07/2015, 1:05 AM

## 2015-11-07 NOTE — Progress Notes (Signed)
Nutrition Brief Note  Patient identified on the Malnutrition Screening Tool (MST) Report  Wt Readings from Last 15 Encounters:  11/07/15 171 lb 4.8 oz (77.7 kg)  06/11/15 169 lb 8.5 oz (76.9 kg)  06/05/15 177 lb (80.287 kg)  10/01/14 182 lb (82.555 kg)  01/12/12 184 lb 4.9 oz (83.6 kg)  09/01/11 172 lb (78.019 kg)  09/24/10 156 lb (70.761 kg)  07/23/10 168 lb 4 oz (76.318 kg)  07/07/10 172 lb (78.019 kg)  05/07/10 189 lb (85.43 kg)   59 year old female with a past medical history significant for ESRD on HD M/W/F, hypertension, hyperlipidemia, obesity, tobacco abuse, who presents after unintentional overdose of tramadol with nausea and vomiting. History is obtained by the patient who is a fair historian she reports that around 2 AM last night that she accidentally took 8 to 9 pills of tramadol. Normally the patient takes six pills at night, however she had mixed up her pain medication bottle with her other pill dispenser for her dialysis medications. She realize this mistake on this morning and noted feeling nauseous, vomiting at least once and having the shakes. Furthermore, she expresses being sick for approximately two weeks noting a productive cough with the whitish to brown sputum. She notes associated symptoms of wheezing with intermittent chest pain subject fever, chills, and sweats she tried Robitussin and Tess and cough syrup along with equate night and day over the counter syrup without relief of symptoms. Fun omission into the emergency department she is found to have a left lower lobe opacity that was suggestive of a pna vs atecletasis  Pt admitted with HCAP.   Spoke with pt who reports poor appetite due to "not being able to taste anything". She also endorses weight loss due to a combination of poor appetite and family stress. She reports her dry weight is 165-167#.   Pt reports her appetite is returning. Pt comsumed 100% of breakfast and lunch meals, because "I was starving". She  does not like Nepro supplements.   Nutrition-Focused physical exam completed. Findings are no fat depletion, no muscle depletion, and no edema.   Body mass index is 30.35 kg/(m^2). Patient meets criteria for obesity, class I based on current BMI.   Current diet order is renal, patient is consuming approximately 100% of meals at this time. Labs and medications reviewed.   No nutrition interventions warranted at this time. If nutrition issues arise, please consult RD.   Journi Moffa A. Jimmye Norman, RD, LDN, CDE Pager: [phone] After hours Pager: [phone]

## 2015-11-07 NOTE — Consult Note (Signed)
Reason for Consult: Continuity of ESRD care Referring Physician: Orson Eva M.D. 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 Prior to this, she reports URI type symptoms unsuccessfully treated with over-the-counter remedies and when seen in the emergency room, chest x-ray found consistent with left lower lobe bronchopneumonia. She denies any subjective fevers but reports some chills and has been coughing with production of sputum without any hemoptysis. She also reports chest wall pain that extends from her mid upper chest to the lower left chest and appears consistent with musculoskeletal sprain.  Dialysis prescription: Monday/Wednesday/Friday at [facility]. 3 hours and 45 minutes, 180 dialyzer, blood flow rate 350, dialysate autoflow 1.5, estimated dry weight 76 kg,   left upper arm AVG, linear sodium modeling and UF profile 4. Heparin 4000 units bolus, Hectorol 2 g 3 times a week at dialysis  Past Medical History  Diagnosis Date  . Hypertension   . Hyperlipidemia   . Obesity   . Fibrosis of uterus   . Anemia   . Chronic bronchitis   . Hyperparathyroidism, secondary renal ([place])   . Shortness of breath dyspnea   . Depression   . GERD (gastroesophageal reflux disease)   . Family history of adverse reaction to anesthesia     "sister took a long long time to wake up"  . Membranoproliferative glomerulonephritis     type 1  . ESRD (end stage renal disease) on dialysis [facility])     "[place]; off [employer]; MWF" (11/06/2015)  . NEPHROTIC SYNDROME 05/07/2010    Qualifier: Diagnosis of  By: Hollie Salk CMA, [name]    . Pneumonia ?01/2015; 06/09/2015; 11/06/2015  . Diabetes mellitus     "only when I take  steroids" (11/06/2015)  . Daily headache   . Degenerative joint disease     "right knee" (11/06/2015)    Past Surgical History  Procedure Laterality Date  . Av fistula placement Left 01/02/10    upper arm; "only lasted 2 months"  . Knee arthroscopy Right 2006  . Esophagogastroduodenoscopy      Dr Lajoyce Corners  . Colonoscopy      Dr Lajoyce Corners  . Vaginal hysterectomy  2011  . Arteriovenous graft placement Left 02/2010    upper arm; wrapped it around the fistula"    Family History  Problem Relation Age of Onset  . Cancer Mother     breast  . Stroke Father     Social History:  reports that she has been smoking Cigarettes.  She has a 14.52 pack-year smoking history. She has never used smokeless tobacco. She reports that she uses illicit drugs (Marijuana). She reports that she does not drink alcohol.  Allergies:  Allergies  Allergen Reactions  . Ace Inhibitors Anaphylaxis, Shortness Of Breath and Swelling    Tongue and throat swells, cannot breathe    Medications:  Scheduled: . amLODipine  10 mg Oral QHS  . antiseptic oral rinse  7 mL Mouth Rinse BID  . vancomycin  750 mg Intravenous Q M,W,F-HD   And  . cefTAZidime (FORTAZ)  IV  2 g Intravenous Q M,W,F-HD  . cinacalcet  60 mg Oral QHS  . [START ON 11/10/2015] doxercalciferol  2 mcg Intravenous Q M,W,F-HD  . feeding supplement (NEPRO CARB STEADY)  237 mL  Oral BID BM  . gabapentin  300 mg Oral QHS  . guaiFENesin  600 mg Oral BID  . heparin  5,000 Units Subcutaneous 3 times per day  . ipratropium-albuterol  3 mL Nebulization Q6H  . losartan  100 mg Oral QHS  . multivitamin with minerals  1 tablet Oral Daily  . nicotine  14 mg Transdermal Daily  . pravastatin  20 mg Oral QHS  . sevelamer carbonate  2,400 mg Oral TID WC    BMP Latest Ref Rng 11/07/2015 11/06/2015 06/11/2015  Glucose 65 - 99 mg/dL 79 89 87  BUN 6 - 20 mg/dL 33(H) 27(H) 30(H)  Creatinine 0.44 - 1.00 mg/dL 10.54(H) 9.85(H) 10.88(H)  Sodium 135 - 145 mmol/L 141 140 138   Potassium 3.5 - 5.1 mmol/L 4.3 4.3 4.6  Chloride 101 - 111 mmol/L 100(L) 96(L) 97(L)  CO2 22 - 32 mmol/L 30 30 29   Calcium 8.9 - 10.3 mg/dL 9.3 10.5(H) 10.1   CBC Latest Ref Rng 11/07/2015 11/06/2015 06/11/2015  WBC 4.0 - 10.5 K/uL 9.5 9.9 8.3  Hemoglobin 12.0 - 15.0 g/dL 10.1(L) 10.9(L) 9.4(L)  Hematocrit 36.0 - 46.0 % 31.6(L) 33.5(L) 28.8(L)  Platelets 150 - 400 K/uL 239 251 256     Dg Chest 2 View  11/06/2015  CLINICAL DATA:  59 year old with 1 day history of cough and shortness of breath. Current history end-stage renal disease on hemodialysis. EXAM: CHEST  2 VIEW COMPARISON:  06/10/2015 and earlier. FINDINGS: AP erect and lateral images were obtained. Cardiac silhouette mildly enlarged but stable, allowing for differences in technique. Thoracic aorta tortuous and atherosclerotic, unchanged. Hilar and mediastinal contours otherwise unremarkable. Streaky and patchy airspace opacities in the left lower lobe. Lungs otherwise clear. Mild pulmonary venous hypertension without overt edema. No pleural effusions. Degenerative changes involving the lumbar spine. IMPRESSION: 1. Left lower lobe atelectasis versus bronchopneumonia. Bronchopneumonia is favored. 2. Stable cardiomegaly. Pulmonary venous hypertension without overt edema. Electronically Signed   By: Evangeline Dakin M.D.   On: 11/06/2015 18:32    Review of Systems  Constitutional: Positive for chills and malaise/fatigue. Negative for fever.  HENT: Positive for congestion and sore throat. Negative for hearing loss, nosebleeds and tinnitus.   Eyes: Negative.   Respiratory: Positive for cough, sputum production and shortness of breath.   Cardiovascular: Positive for chest pain. Negative for palpitations, orthopnea, claudication and leg swelling.       Reports a musculoskeletal left-sided chest pain  Gastrointestinal: Positive for nausea and vomiting. Negative for abdominal pain and diarrhea.  Genitourinary: Negative.   Musculoskeletal:  Negative.   Skin: Negative.   Neurological: Positive for dizziness, weakness and headaches. Negative for tingling, tremors and sensory change.  Endo/Heme/Allergies: Negative.   Psychiatric/Behavioral: Negative.    Blood pressure 150/84, pulse 85, temperature 98 F (36.7 C), temperature source Oral, resp. rate 15, height 5\' 3"  (1.6 m), weight 72.303 kg (159 lb 6.4 oz), SpO2 100 %. Physical Exam  Nursing note and vitals reviewed. Constitutional: She is oriented to person, place, and time. She appears well-developed.  HENT:  Head: Normocephalic.  Nose: Nose normal.  Mouth/Throat: No oropharyngeal exudate.  Eyes: EOM are normal. Pupils are equal, round, and reactive to light. No scleral icterus.  Neck: Normal range of motion. Neck supple. No JVD present. No thyromegaly present.  Cardiovascular: Normal rate, regular rhythm and normal heart sounds.  Exam reveals no friction rub.   Respiratory: Effort normal. She has wheezes.  GI: Soft. Bowel sounds are normal. She exhibits no  distension. There is no tenderness. There is no rebound.  Musculoskeletal: She exhibits no edema.  Left upper arm AV graft-palpable thrill and bruit  Neurological: She is alert and oriented to person, place, and time. No cranial nerve deficit.  Skin: Skin is warm and dry. No rash noted. No erythema.  Psychiatric: She has a normal mood and affect. Her behavior is normal.    Assessment/Plan: 1. Healthcare associated pneumonia: Broad-spectrum antibiotic therapy coverage with vancomycin and Fortaz-afebrile and without significant leukocytosis/features of sepsis.  2. Accidental tramadol overdose: anticipated to improve with hemodialysis, continue to monitor.  3. End-stage renal disease: Hemodialysis today per her usual outpatient schedule, clinically she does not appear to be volume overloaded and comes in with below her dry weight (estimated dry weight as an outpatient is 76 kg while her weight today is 72.3). Suspecting  discrepancies with weighing scales, I will target for ultrafiltration goal of 1.5 L at dialysis today. Her blood pressures appear to indicate that she will tolerate this. 4. Anemia:  redose Aranesp-denies overt losses 5. Metabolic bone disease: Phosphorus binders restarted, Sensipar restarted for PTH lowering and I have restarted Hectorol to assist with PTH suppression. 6. Hypertension: Elevated blood pressures noted-possibly aggravated by her recent pain, antihypertensive medications restarted and anticipated to improve further with ultrafiltration at hemodialysis.  Damere Brandenburg K. 11/07/2015, 2:21 PM

## 2015-11-07 NOTE — Progress Notes (Signed)
PROGRESS NOTE  Kim Hodge [id number] DOB: 10-Nov-1956 DOA: 11/06/2015 PCP: Hoyt Koch, MD  Brief history 59 year old female with a history of ESRD (MWF), hypertension, hyperlipidemia, continued tobacco use, diabetes mellitus presented with dizzness, nausea and vomiting after she realized that she accidentally took 8-9 tramadol tablets. The patient states that she grabbed the wrong bottle of pills in the dark and tripped by mistake. She did not realize her mistake until the following day when she had some nausea and emesis and she went back to check her bottles. However, she has been complaining of not feeling well for the past 2 weeks with complaints of subjective fevers and chills with coughing and intermittent shortness of breath. OTC remedies have not helped her symptoms.  Unfortunately, the patient continues to smoke one half pack per day. She has white/brown sputum without hemoptysis. Evaluation in emergency department revealed possible left lower lobe opacity/atelectasis. The patient was started on vancomycin and ceftazidime. Assessment/Plan: HCAP -11/07/15 CXR with LLL infiltrate vs atelectasis -continue vancomycin and ceftazidime -wean oxygen for sat >92% -pulmonary hygiene -continue duonebs -check procalcitonin Unintentional tramadol overdose -N/V improved -Mental status at baseline Atypical chest pain -Secondary to musculoskeletal strain from coughing -anti-tussive prn -EKG without any concerning ischemic changes -check troponin ESRD -Appreciate nephrology -Continue Renvela, Sensipar Hypertension  -Continue amlodipine and losartan  Tobacco abuse  -presumptive underlying COPD -Tobacco cessation discussed  -continue duonebs q6hrs -NicoDerm patch  Anemia of CKD  -Hemoglobin stable  -Baseline hemoglobin ~10 -Aranesp per renal Hyperlipidemia -Continue pravastatin   Family Communication:   Pt at beside Disposition Plan:   Home 1-2  days        Procedures/Studies: Dg Chest 2 View  11/06/2015  CLINICAL DATA:  59 year old with 1 day history of cough and shortness of breath. Current history end-stage renal disease on hemodialysis. EXAM: CHEST  2 VIEW COMPARISON:  06/10/2015 and earlier. FINDINGS: AP erect and lateral images were obtained. Cardiac silhouette mildly enlarged but stable, allowing for differences in technique. Thoracic aorta tortuous and atherosclerotic, unchanged. Hilar and mediastinal contours otherwise unremarkable. Streaky and patchy airspace opacities in the left lower lobe. Lungs otherwise clear. Mild pulmonary venous hypertension without overt edema. No pleural effusions. Degenerative changes involving the lumbar spine. IMPRESSION: 1. Left lower lobe atelectasis versus bronchopneumonia. Bronchopneumonia is favored. 2. Stable cardiomegaly. Pulmonary venous hypertension without overt edema. Electronically Signed   By: Evangeline Dakin M.D.   On: 11/06/2015 18:32        Subjective: Patient is complaining of coughing with dyspnea on exertion. She has some chest discomfort with coughing. Denies hemoptysis, nausea, vomiting, diarrhea, abdominal pain. No dysuria denies any headache or neck pain.  Objective: Filed Vitals:   11/06/15 2028 11/06/15 2223 11/07/15 0619 11/07/15 0852  BP: 173/95 154/99 150/84   Pulse: 77  85   Temp: 97.9 F (36.6 C)  98 F (36.7 C)   TempSrc: Oral  Oral   Resp: 18  15   Height: 5\' 3"  (1.6 m)     Weight: 72.303 kg (159 lb 6.4 oz)     SpO2: 100%  100% 100%    Intake/Output Summary (Last 24 hours) at 11/07/15 1428 Last data filed at 11/06/15 2237  Gross per 24 hour  Intake 1304.17 ml  Output      0 ml  Net 1304.17 ml   Weight change:  Exam:   General:  Pt is alert, follows commands appropriately, not in acute  distress  HEENT: No icterus, No thrush, No neck mass, [place]/AT  Cardiovascular: RRR, S1/S2, no rubs, no gallops  Respiratory: Diminished breath sounds at  the bases. No wheezing  Abdomen: Soft/+BS, non tender, non distended, no guarding; no hepatosplenomegaly  Extremities: No edema, No lymphangitis, No petechiae, No rashes, no synovitis; no cyanosis or clubbing  Data Reviewed: Basic Metabolic Panel:  Recent Labs Lab 11/06/15 1621 11/07/15 0555  NA 140 141  K 4.3 4.3  CL 96* 100*  CO2 30 30  GLUCOSE 89 79  BUN 27* 33*  CREATININE 9.85* 10.54*  CALCIUM 10.5* 9.3  PHOS  --  6.3*   Liver Function Tests:  Recent Labs Lab 11/06/15 1621 11/07/15 0555  AST 25  --   ALT 23  --   ALKPHOS 89  --   BILITOT 0.5  --   PROT 6.8  --   ALBUMIN 3.3* 3.1*   No results for input(s): LIPASE, AMYLASE in the last 168 hours. No results for input(s): AMMONIA in the last 168 hours. CBC:  Recent Labs Lab 11/06/15 1621 11/07/15 0555  WBC 9.9 9.5  NEUTROABS 8.0* 6.8  HGB 10.9* 10.1*  HCT 33.5* 31.6*  MCV 92.3 92.9  PLT 251 239   Cardiac Enzymes: No results for input(s): CKTOTAL, CKMB, CKMBINDEX, TROPONINI in the last 168 hours. BNP: Invalid input(s): POCBNP CBG: No results for input(s): GLUCAP in the last 168 hours.  Recent Results (from the past 240 hour(s))  MRSA PCR Screening     Status: None   Collection Time: 11/06/15  9:38 PM  Result Value Ref Range Status   MRSA by PCR NEGATIVE NEGATIVE Final    Comment:        The GeneXpert MRSA Assay (FDA approved for NASAL specimens only), is one component of a comprehensive MRSA colonization surveillance program. It is not intended to diagnose MRSA infection nor to guide or monitor treatment for MRSA infections.   Culture, blood (routine x 2) Call MD if unable to obtain prior to antibiotics being given     Status: None (Preliminary result)   Collection Time: 11/06/15 10:05 PM  Result Value Ref Range Status   Specimen Description BLOOD RIGHT ARM  Final   Special Requests BOTTLES DRAWN AEROBIC AND ANAEROBIC 10CC  Final   Culture NO GROWTH < 24 HOURS  Final   Report Status  PENDING  Incomplete  Culture, blood (routine x 2) Call MD if unable to obtain prior to antibiotics being given     Status: None (Preliminary result)   Collection Time: 11/06/15 10:15 PM  Result Value Ref Range Status   Specimen Description BLOOD RIGHT ARM  Final   Special Requests BOTTLES DRAWN AEROBIC AND ANAEROBIC 10CC  Final   Culture NO GROWTH < 24 HOURS  Final   Report Status PENDING  Incomplete  Culture, sputum-assessment     Status: None   Collection Time: 11/07/15  7:39 AM  Result Value Ref Range Status   Specimen Description SPUTUM  Final   Special Requests NONE  Final   Sputum evaluation   Final    MICROSCOPIC FINDINGS SUGGEST THAT THIS SPECIMEN IS NOT REPRESENTATIVE OF LOWER RESPIRATORY SECRETIONS. PLEASE RECOLLECT. Gram Stain Report Called to,Read Back By and Verified With: VErskine Emery AT [id number] ON [id number] BY Rhea Bleacher    Report Status 11/07/2015 FINAL  Final     Scheduled Meds: . amLODipine  10 mg Oral QHS  . antiseptic oral rinse  7 mL Mouth Rinse BID  .  vancomycin  750 mg Intravenous Q M,W,F-HD   And  . cefTAZidime (FORTAZ)  IV  2 g Intravenous Q M,W,F-HD  . cinacalcet  60 mg Oral QHS  . [START ON 11/10/2015] doxercalciferol  2 mcg Intravenous Q M,W,F-HD  . feeding supplement (NEPRO CARB STEADY)  237 mL Oral BID BM  . gabapentin  300 mg Oral QHS  . guaiFENesin  600 mg Oral BID  . heparin  5,000 Units Subcutaneous 3 times per day  . ipratropium-albuterol  3 mL Nebulization Q6H  . losartan  100 mg Oral QHS  . multivitamin with minerals  1 tablet Oral Daily  . nicotine  14 mg Transdermal Daily  . pravastatin  20 mg Oral QHS  . sevelamer carbonate  2,400 mg Oral TID WC   Continuous Infusions:    Trayquan Kolakowski, DO  [facility] Pager [phone]  If 7PM-7AM, please contact night-coverage [email or web address] Password [email or web address] 11/07/2015, 2:28 PM   LOS: 1 day

## 2015-11-07 NOTE — Progress Notes (Signed)
Utilization review completed.  

## 2015-11-07 NOTE — Procedures (Signed)
Patient seen on Hemodialysis. QB 350, UF goal 2L Treatment adjusted as needed.  Elmarie Shiley MD [facility]. Office # [phone] Pager # [phone] 3:56 PM

## 2015-11-07 NOTE — Progress Notes (Signed)
Admitted pt.from ED ,alert,oriented x4,no acute distress noted.V/S taken & recorded.IV in place w/ occlusive dsd.intact,no redness,swelling noted.Oriented pt.to the room,call bell & information packet given to pt.Admission inpt.armband ID verified w/ pt.& applied.Fall assessment completed w/ pt.& able to verbalize understanding of risks associated w/ falls & to call nurse before getting up in bed.Call light within  Reach.Skin dry & intact.Will continue to monitor pt.

## 2015-11-08 DIAGNOSIS — D638 Anemia in other chronic diseases classified elsewhere: Secondary | ICD-10-CM

## 2015-11-08 DIAGNOSIS — R55 Syncope and collapse: Secondary | ICD-10-CM

## 2015-11-08 LAB — RENAL FUNCTION PANEL
Albumin: 3.2 g/dL — ABNORMAL LOW (ref 3.5–5.0)
Anion gap: 13 (ref 5–15)
BUN: 21 mg/dL — ABNORMAL HIGH (ref 6–20)
CO2: 31 mmol/L (ref 22–32)
Calcium: 9.9 mg/dL (ref 8.9–10.3)
Chloride: 96 mmol/L — ABNORMAL LOW (ref 101–111)
Creatinine, Ser: 8.1 mg/dL — ABNORMAL HIGH (ref 0.44–1.00)
GFR calc Af Amer: 6 mL/min — ABNORMAL LOW (ref >60)
GFR calc non Af Amer: 5 mL/min — ABNORMAL LOW (ref >60)
Glucose, Bld: 93 mg/dL (ref 65–99)
Phosphorus: 4.5 mg/dL (ref 2.5–4.6)
Potassium: 3.3 mmol/L — ABNORMAL LOW (ref 3.5–5.1)
Sodium: 140 mmol/L (ref 135–145)

## 2015-11-08 LAB — CBC
HCT: 32.4 % — ABNORMAL LOW (ref 36.0–46.0)
Hemoglobin: 10.3 g/dL — ABNORMAL LOW (ref 12.0–15.0)
MCH: 29.3 pg (ref 26.0–34.0)
MCHC: 31.8 g/dL (ref 30.0–36.0)
MCV: 92 fL (ref 78.0–100.0)
Platelets: 264 10*3/uL (ref 150–400)
RBC: 3.52 MIL/uL — ABNORMAL LOW (ref 3.87–5.11)
RDW: 16.3 % — ABNORMAL HIGH (ref 11.5–15.5)
WBC: 9.1 10*3/uL (ref 4.0–10.5)

## 2015-11-08 MED ORDER — LIDOCAINE HCL (PF) 1 % IJ SOLN
5.0000 mL | INTRAMUSCULAR | Status: DC | PRN
  Filled 2015-11-08: qty 5

## 2015-11-08 MED ORDER — HEPARIN SODIUM (PORCINE) 1000 UNIT/ML DIALYSIS: 4000.0000 [IU] | Freq: Once | INTRAMUSCULAR | Status: DC

## 2015-11-08 MED ORDER — PENTAFLUOROPROP-TETRAFLUOROETH EX AERO: 1.0000 "application " | INHALATION_SPRAY | CUTANEOUS | Status: DC | PRN

## 2015-11-08 MED ORDER — IPRATROPIUM-ALBUTEROL 0.5-2.5 (3) MG/3ML IN SOLN
3.0000 mL | Freq: Three times a day (TID) | RESPIRATORY_TRACT | Status: DC
  Administered 2015-11-09: 3 mL via RESPIRATORY_TRACT
  Filled 2015-11-08: qty 3

## 2015-11-08 MED ORDER — HEPARIN SODIUM (PORCINE) 1000 UNIT/ML DIALYSIS: 1000.0000 [IU] | INTRAMUSCULAR | Status: DC | PRN

## 2015-11-08 MED ORDER — GABAPENTIN 100 MG PO CAPS
100.0000 mg | ORAL_CAPSULE | Freq: Every day | ORAL | Status: DC
  Administered 2015-11-08: 100 mg via ORAL
  Filled 2015-11-08: qty 1

## 2015-11-08 MED ORDER — ALBUTEROL SULFATE (2.5 MG/3ML) 0.083% IN NEBU: 2.5000 mg | INHALATION_SOLUTION | RESPIRATORY_TRACT | Status: DC | PRN

## 2015-11-08 MED ORDER — SODIUM CHLORIDE 0.9 % IV SOLN: 100.0000 mL | INTRAVENOUS | Status: DC | PRN

## 2015-11-08 MED ORDER — ALTEPLASE 2 MG IJ SOLR
2.0000 mg | Freq: Once | INTRAMUSCULAR | Status: DC | PRN
  Filled 2015-11-08: qty 2

## 2015-11-08 MED ORDER — LIDOCAINE-PRILOCAINE 2.5-2.5 % EX CREA
1.0000 "application " | TOPICAL_CREAM | CUTANEOUS | Status: DC | PRN
  Filled 2015-11-08: qty 5

## 2015-11-08 NOTE — Progress Notes (Signed)
PROGRESS NOTE  Kim Hodge [id number] DOB: [birth year] DOA: 11/06/2015 PCP: Hoyt Koch, MD  Brief history 59 year old female with a history of ESRD (MWF), hypertension, hyperlipidemia, continued tobacco use, diabetes mellitus presented with dizzness, nausea and vomiting after she realized that she accidentally took 8-9 tramadol tablets. The patient states that she grabbed the wrong bottle of pills in the dark and tripped by mistake. She did not realize her mistake until the following day when she had some nausea and emesis and she went back to check her bottles. However, she has been complaining of not feeling well for the past 2 weeks with complaints of subjective fevers and chills with coughing and intermittent shortness of breath. OTC remedies have not helped her symptoms. Unfortunately, the patient continues to smoke one half pack per day. She has white/brown sputum without hemoptysis. Evaluation in emergency department revealed possible left lower lobe opacity/atelectasis. The patient was started on vancomycin and ceftazidime. Assessment/Plan: HCAP -11/07/15 CXR with LLL infiltrate vs atelectasis -continue vancomycin and ceftazidime -wean oxygen for sat >92% -pulmonary hygiene -stable on RA -continue duonebs -check procalcitonin--0.28 Near Syncope -11/08/15--when I stood pt up--pt became dizzy-->near syncope -check orthostatics -echo--pt has systolic murmur radiating to carotids at left sternal border -PT eval -decrease gabapentin dose Unintentional tramadol overdose -N/V improved -Mental status at baseline Atypical chest pain/elevated troponin -demand ischemia in setting of HCAP and ESRD -Secondary to musculoskeletal strain from coughing -anti-tussive prn -EKG without any concerning ischemic changes -check troponin--0.05, 0.04 ESRD -Appreciate nephrology -Continue Renvela, Sensipar Hypertension  -Continue amlodipine and losartan  Tobacco abuse   -presumptive underlying COPD -Tobacco cessation discussed  -continue duonebs q6hrs -NicoDerm patch  Anemia of CKD  -Hemoglobin stable  -Baseline hemoglobin ~10 -Aranesp per renal Hyperlipidemia -Continue pravastatin   Family Communication: Pt at beside Disposition Plan: Home 1-2 days    Procedures/Studies: Dg Chest 2 View  11/06/2015  CLINICAL DATA:  59 year old with 1 day history of cough and shortness of breath. Current history end-stage renal disease on hemodialysis. EXAM: CHEST  2 VIEW COMPARISON:  06/10/2015 and earlier. FINDINGS: AP erect and lateral images were obtained. Cardiac silhouette mildly enlarged but stable, allowing for differences in technique. Thoracic aorta tortuous and atherosclerotic, unchanged. Hilar and mediastinal contours otherwise unremarkable. Streaky and patchy airspace opacities in the left lower lobe. Lungs otherwise clear. Mild pulmonary venous hypertension without overt edema. No pleural effusions. Degenerative changes involving the lumbar spine. IMPRESSION: 1. Left lower lobe atelectasis versus bronchopneumonia. Bronchopneumonia is favored. 2. Stable cardiomegaly. Pulmonary venous hypertension without overt edema. Electronically Signed   By: Evangeline Dakin M.D.   On: 11/06/2015 18:32         Subjective: Patient still complains of coughing, but denies any fevers, chills, shortness breath, nausea, vomiting, diarrhea, abdominal pain. She had a near syncopal episode when standing up to walk. She states her chest pain has improved.  Objective: Filed Vitals:   11/08/15 0554 11/08/15 0734 11/08/15 0959 11/08/15 1308  BP: 153/75  144/67   Pulse: 87  84   Temp: 98.9 F (37.2 C)  98.2 F (36.8 C)   TempSrc: Oral  Oral   Resp: 19  18   Height:      Weight:      SpO2:  98% 100% 100%    Intake/Output Summary (Last 24 hours) at 11/08/15 1734 Last data filed at 11/08/15 1356  Gross per 24 hour  Intake    422 ml  Output      0 ml  Net     422 ml   Weight change: 2.7 kg (5 lb 15.2 oz) Exam:   General:  Pt is alert, follows commands appropriately, not in acute distress  HEENT: No icterus, No thrush, No neck mass, [place]/AT  Cardiovascular: RRR, S1/S2, no rubs, no gallops  Respiratory: Bibasilar Rales without Any Wheezing  Abdomen: Soft/+BS, non tender, non distended, no guarding; no hepatosplenomegaly  Extremities: No edema, No lymphangitis, No petechiae, No rashes, no synovitis; no cyanosis or clubbing  Data Reviewed: Basic Metabolic Panel:  Recent Labs Lab 11/06/15 1621 11/07/15 0555 11/08/15 1630  NA 140 141 140  K 4.3 4.3 3.3*  CL 96* 100* 96*  CO2 30 30 31   GLUCOSE 89 79 93  BUN 27* 33* 21*  CREATININE 9.85* 10.54* 8.10*  CALCIUM 10.5* 9.3 9.9  PHOS  --  6.3* 4.5   Liver Function Tests:  Recent Labs Lab 11/06/15 1621 11/07/15 0555 11/08/15 1630  AST 25  --   --   ALT 23  --   --   ALKPHOS 89  --   --   BILITOT 0.5  --   --   PROT 6.8  --   --   ALBUMIN 3.3* 3.1* 3.2*   No results for input(s): LIPASE, AMYLASE in the last 168 hours. No results for input(s): AMMONIA in the last 168 hours. CBC:  Recent Labs Lab 11/06/15 1621 11/07/15 0555 11/08/15 1630  WBC 9.9 9.5 9.1  NEUTROABS 8.0* 6.8  --   HGB 10.9* 10.1* 10.3*  HCT 33.5* 31.6* 32.4*  MCV 92.3 92.9 92.0  PLT 251 239 264   Cardiac Enzymes:  Recent Labs Lab 11/07/15 1426 11/07/15 2056  TROPONINI 0.05* 0.04*   BNP: Invalid input(s): POCBNP CBG: No results for input(s): GLUCAP in the last 168 hours.  Recent Results (from the past 240 hour(s))  MRSA PCR Screening     Status: None   Collection Time: 11/06/15  9:38 PM  Result Value Ref Range Status   MRSA by PCR NEGATIVE NEGATIVE Final    Comment:        The GeneXpert MRSA Assay (FDA approved for NASAL specimens only), is one component of a comprehensive MRSA colonization surveillance program. It is not intended to diagnose MRSA infection nor to guide or monitor  treatment for MRSA infections.   Culture, blood (routine x 2) Call MD if unable to obtain prior to antibiotics being given     Status: None (Preliminary result)   Collection Time: 11/06/15 10:05 PM  Result Value Ref Range Status   Specimen Description BLOOD RIGHT ARM  Final   Special Requests BOTTLES DRAWN AEROBIC AND ANAEROBIC 10CC  Final   Culture NO GROWTH 2 DAYS  Final   Report Status PENDING  Incomplete  Culture, blood (routine x 2) Call MD if unable to obtain prior to antibiotics being given     Status: None (Preliminary result)   Collection Time: 11/06/15 10:15 PM  Result Value Ref Range Status   Specimen Description BLOOD RIGHT ARM  Final   Special Requests BOTTLES DRAWN AEROBIC AND ANAEROBIC 10CC  Final   Culture NO GROWTH 2 DAYS  Final   Report Status PENDING  Incomplete  Culture, sputum-assessment     Status: None   Collection Time: 11/07/15  7:39 AM  Result Value Ref Range Status   Specimen Description SPUTUM  Final   Special Requests NONE  Final   Sputum  evaluation   Final    MICROSCOPIC FINDINGS SUGGEST THAT THIS SPECIMEN IS NOT REPRESENTATIVE OF LOWER RESPIRATORY SECRETIONS. PLEASE RECOLLECT. Gram Stain Report Called to,Read Back By and Verified With: VErskine Emery AT [id number] ON [id number] BY Rhea Bleacher    Report Status 11/07/2015 FINAL  Final     Scheduled Meds: . amLODipine  10 mg Oral QHS  . antiseptic oral rinse  7 mL Mouth Rinse BID  . vancomycin  750 mg Intravenous Q M,W,F-HD   And  . cefTAZidime (FORTAZ)  IV  2 g Intravenous Q M,W,F-HD  . cinacalcet  60 mg Oral QHS  . [START ON 11/14/2015] darbepoetin (ARANESP) injection - DIALYSIS  60 mcg Intravenous Q Fri-HD  . [START ON 11/10/2015] doxercalciferol  2 mcg Intravenous Q M,W,F-HD  . feeding supplement (NEPRO CARB STEADY)  237 mL Oral BID BM  . gabapentin  300 mg Oral QHS  . guaiFENesin  600 mg Oral BID  . heparin  4,000 Units Dialysis Once in dialysis  . heparin  5,000 Units Subcutaneous 3 times per day  .  ipratropium-albuterol  3 mL Nebulization Q6H  . losartan  100 mg Oral QHS  . multivitamin with minerals  1 tablet Oral Daily  . nicotine  14 mg Transdermal Daily  . pravastatin  20 mg Oral QHS  . sevelamer carbonate  2,400 mg Oral TID WC   Continuous Infusions:    Devrin Monforte, DO  [facility] Pager [phone]  If 7PM-7AM, please contact night-coverage [email or web address] Password [email or web address] 11/08/2015, 5:34 PM   LOS: 2 days

## 2015-11-08 NOTE — Progress Notes (Signed)
Patient ID: Kim Hodge, female   DOB: 11-23-1956, 59 y.o.   MRN: [id number]  [facility] Progress Note   Assessment/ Plan:   1. Healthcare associated pneumonia: On antibiotic coverage with vancomycin and Fortaz-she is afebrile and without significant leukocytosis. On supplemental oxygenation however records show that she had 98% saturation on room air-would attempt to ambulate her and see if she desaturates. 2. Accidental tramadol overdose: Re-educated on appropriate medication use/safety. Attempt to ambulate to see if she has persistent dizziness that she voiced yesterday.  3. End-stage renal disease: Underwent hemodialysis yesterday without any problems, no acute indications for hemodialysis and plan for next hemodialysis Monday if she is still here. 4. Anemia: redose Aranesp-denies overt losses 5. Metabolic bone disease: Phosphorus binders restarted-elevated phosphorus levels noted, continue Sensipar and Hectorol for PTH suppression. 6. Hypertension: Elevated blood pressures-oral hypertensive agents resumed and underwent ultrafiltration with hemodialysis, continue to monitor.  Subjective:   Reports today feeling fair-concerned about fluctuations in oxygenation/heartrate that she noted overnight with continuous monitoring. Denies any chest pain or obvious shortness of breath.    Objective:   BP 153/75 mmHg  Pulse 87  Temp(Src) 98.9 F (37.2 C) (Oral)  Resp 19  Ht 5\' 3"  (1.6 m)  Wt 76.2 kg (167 lb 15.9 oz)  BMI 29.77 kg/m2  SpO2 98%  Physical Exam: Gen: Comfortably resting in bed, eating breakfast CVS: Pulse regular in rate and rhythm, S1 and S2 normal Resp: Decreased breath sounds over bases-poor inspiratory effort Abd: Soft, obese, nontender Ext: No lower extremity edema, left upper arm AVG  Labs: BMET  Recent Labs Lab 11/06/15 1621 11/07/15 0555  NA 140 141  K 4.3 4.3  CL 96* 100*  CO2 30 30  GLUCOSE 89 79  BUN 27* 33*  CREATININE 9.85* 10.54*   CALCIUM 10.5* 9.3  PHOS  --  6.3*   CBC  Recent Labs Lab 11/06/15 1621 11/07/15 0555  WBC 9.9 9.5  NEUTROABS 8.0* 6.8  HGB 10.9* 10.1*  HCT 33.5* 31.6*  MCV 92.3 92.9  PLT 251 239   Medications:    . amLODipine  10 mg Oral QHS  . antiseptic oral rinse  7 mL Mouth Rinse BID  . vancomycin  750 mg Intravenous Q M,W,F-HD   And  . cefTAZidime (FORTAZ)  IV  2 g Intravenous Q M,W,F-HD  . cinacalcet  60 mg Oral QHS  . [START ON 11/14/2015] darbepoetin (ARANESP) injection - DIALYSIS  60 mcg Intravenous Q Fri-HD  . [START ON 11/10/2015] doxercalciferol  2 mcg Intravenous Q M,W,F-HD  . feeding supplement (NEPRO CARB STEADY)  237 mL Oral BID BM  . gabapentin  300 mg Oral QHS  . guaiFENesin  600 mg Oral BID  . heparin  5,000 Units Subcutaneous 3 times per day  . ipratropium-albuterol  3 mL Nebulization Q6H  . losartan  100 mg Oral QHS  . multivitamin with minerals  1 tablet Oral Daily  . nicotine  14 mg Transdermal Daily  . pravastatin  20 mg Oral QHS  . sevelamer carbonate  2,400 mg Oral TID WC   Elmarie Shiley, MD 11/08/2015, 9:09 AM

## 2015-11-09 LAB — PROCALCITONIN: Procalcitonin: 0.28 ng/mL

## 2015-11-09 MED ORDER — NEPRO/CARBSTEADY PO LIQD: 237.0000 mL | Freq: Two times a day (BID) | ORAL | Status: DC

## 2015-11-09 MED ORDER — IPRATROPIUM-ALBUTEROL 0.5-2.5 (3) MG/3ML IN SOLN: 3.0000 mL | Freq: Four times a day (QID) | RESPIRATORY_TRACT | Status: DC | PRN

## 2015-11-09 MED ORDER — VANCOMYCIN HCL IN DEXTROSE 750-5 MG/150ML-% IV SOLN: 750.0000 mg | INTRAVENOUS | Status: DC

## 2015-11-09 MED ORDER — DEXTROSE 5 % IV SOLN: 2.0000 g | INTRAVENOUS | Status: DC

## 2015-11-09 MED ORDER — PRAMIPEXOLE DIHYDROCHLORIDE 0.125 MG PO TABS: 0.1250 mg | ORAL_TABLET | Freq: Three times a day (TID) | ORAL | Status: DC

## 2015-11-09 NOTE — Progress Notes (Signed)
Patient ID: Kim Hodge, female   DOB: May 09, 1956, 59 y.o.   MRN: [id number]  [facility] Progress Note   Assessment/ Plan:   1. Healthcare associated pneumonia: On antibiotic coverage with vancomycin and Fortaz- afebrile and with improving leukocytosis/symptoms-not hypoxemic. Antibiotics with hemodialysis for a total treatment duration of 10 days (last dose on 11/21) 2. Accidental tramadol overdose: Re-educated on appropriate medication use/safety. Still with apparent unsteadiness/dizziness on attempted ambulation-reevaluate 3. End-stage renal disease: No acute dialysis needs noted today, plan for routine scheduled hemodialysis treatment tomorrow 4. Anemia: No overt blood loss noted-continue Aranesp at current dose 5. Metabolic bone disease: Calcium/phosphorus levels acceptable on current doses of binder/Hectorol/Sensipar 6. Hypertension: Blood pressure is marginally elevated-monitor for alteration of ultrafiltration goal  Subjective:   Reports some unsteadiness/instability on attempted ambulation. Awaiting echocardiogram to evaluate heart murmur .   Objective:   BP 145/77 mmHg  Pulse 95  Temp(Src) 98.1 F (36.7 C) (Oral)  Resp 18  Ht 5\' 3"  (1.6 m)  Wt 74.072 kg (163 lb 4.8 oz)  BMI 28.93 kg/m2  SpO2 100%  Physical Exam: Gen: Comfortably resting in bed, speaking on telephone CVS: Pulse regular in rate and rhythm, 3/6 HSM Resp: Decreased breath sounds over bases otherwise clear to auscultation-no distinct rales Abd: Soft, obese, nontender Ext: No lower extremity edema, left upper arm AVG  Labs: BMET  Recent Labs Lab 11/06/15 1621 11/07/15 0555 11/08/15 1630  NA 140 141 140  K 4.3 4.3 3.3*  CL 96* 100* 96*  CO2 30 30 31   GLUCOSE 89 79 93  BUN 27* 33* 21*  CREATININE 9.85* 10.54* 8.10*  CALCIUM 10.5* 9.3 9.9  PHOS  --  6.3* 4.5   CBC  Recent Labs Lab 11/06/15 1621 11/07/15 0555 11/08/15 1630  WBC 9.9 9.5 9.1  NEUTROABS 8.0* 6.8  --   HGB  10.9* 10.1* 10.3*  HCT 33.5* 31.6* 32.4*  MCV 92.3 92.9 92.0  PLT 251 239 264   Medications:    . amLODipine  10 mg Oral QHS  . antiseptic oral rinse  7 mL Mouth Rinse BID  . vancomycin  750 mg Intravenous Q M,W,F-HD   And  . cefTAZidime (FORTAZ)  IV  2 g Intravenous Q M,W,F-HD  . cinacalcet  60 mg Oral QHS  . [START ON 11/14/2015] darbepoetin (ARANESP) injection - DIALYSIS  60 mcg Intravenous Q Fri-HD  . [START ON 11/10/2015] doxercalciferol  2 mcg Intravenous Q M,W,F-HD  . feeding supplement (NEPRO CARB STEADY)  237 mL Oral BID BM  . gabapentin  100 mg Oral QHS  . guaiFENesin  600 mg Oral BID  . heparin  4,000 Units Dialysis Once in dialysis  . heparin  5,000 Units Subcutaneous 3 times per day  . ipratropium-albuterol  3 mL Nebulization TID  . losartan  100 mg Oral QHS  . multivitamin with minerals  1 tablet Oral Daily  . nicotine  14 mg Transdermal Daily  . pravastatin  20 mg Oral QHS  . sevelamer carbonate  2,400 mg Oral TID WC   Elmarie Shiley, MD 11/09/2015, 9:48 AM

## 2015-11-09 NOTE — Care Management Note (Signed)
Case Management Note  Patient Details  Name: MAECY SCHEDLER MRN: [id number] Date of Birth: 10/17/1956  Subjective/Objective:                  LLL infiltrate vs atelectasis Action/Plan: Discharge planning Expected Discharge Date:  11/09/15               Expected Discharge Plan:  [place]  In-House Referral:     Discharge planning Services  CM Consult  Post Acute Care Choice:  Home Health Choice offered to:  Patient  DME Arranged:    DME Agency:     HH Arranged:  Patient Refused [facility] Agency:     Status of Service:  Completed, signed off  Medicare Important Message Given:    Date Medicare IM Given:    Medicare IM give by:    Date Additional Medicare IM Given:    Additional Medicare Important Message give by:     If discussed at [place] of Stay Meetings, dates discussed:    Additional Comments: CM spoke with pt to offer choice of home health agency.  Pt politely refuses as she states she gets around just fine.  No other Cm needs were communicated. Dellie Catholic, RN 11/09/2015, 3:08 PM

## 2015-11-09 NOTE — Discharge Summary (Signed)
Physician Discharge Summary  Kim Hodge [id number] DOB: 03-23-1956 DOA: 11/06/2015  PCP: Hoyt Koch, MD  Admit date: 11/06/2015 Discharge date: 11/09/2015  Recommendations for Outpatient Follow-up:  1. Pt will need to follow up with PCP in 2 weeks post discharge 2. Please dose vancomycin and ceftazidime on HD on 11/14, 11/16 and 11/18--then stop   Discharge Diagnoses:  HCAP -11/07/15 CXR with LLL infiltrate vs atelectasis -continue vancomycin and ceftazidime (D#3) -plan on vanco and ceftazidime on HD on 11/14, 11/16 and 11/18--then stop -wean oxygen for sat >92% -pulmonary hygiene -stable on RA with desaturation upon ambulation -continue duonebs -check procalcitonin--0.28-->0.28 Near Syncope -11/08/15--when I stood pt up--pt became dizzy-->near syncope -check orthostatics--negative -will need to tolerate higher BP -echo--pt has systolic murmur radiating to carotids at left sternal border-this can be done in the outpatient setting -PT eval -d/c gabapentin--pt states it does not help anyway Unintentional tramadol overdose -N/V improved -Mental status at baseline Atypical chest pain/elevated troponin -demand ischemia in setting of HCAP and ESRD -Secondary to musculoskeletal strain from coughing -anti-tussive prn -EKG without any concerning ischemic changes -check troponin--0.05, 0.04 ESRD -Appreciate nephrology -Continue Renvela, Sensipar Hypertension  -Continue amlodipine and losartan  Tobacco abuse  -presumptive underlying COPD -Tobacco cessation discussed  -continue duonebs q6hrs during the hospitalization -NicoDerm patch  Anemia of CKD  -Hemoglobin stable  -Baseline hemoglobin ~10 -Aranesp per renal Restless Leg syndrome -trial of low dose mirapex Hyperlipidemia -Continue pravastatin  Discharge Condition: stable  Disposition: home  Diet:renal with 1200 fluid restriction Wt Readings from Last 3 Encounters:  11/09/15 74.072 kg  (163 lb 4.8 oz)  06/11/15 76.9 kg (169 lb 8.5 oz)  06/05/15 80.287 kg (177 lb)    History of present illness:  59 year old female with a history of ESRD (MWF), hypertension, hyperlipidemia, continued tobacco use, diabetes mellitus presented with dizzness, nausea and vomiting after she realized that she accidentally took 8-9 tramadol tablets. The patient states that she grabbed the wrong bottle of pills in the dark and tripped by mistake. She did not realize her mistake until the following day when she had some nausea and emesis and she went back to check her bottles. However, she has been complaining of not feeling well for the past 2 weeks with complaints of subjective fevers and chills with coughing and intermittent shortness of breath. OTC remedies have not helped her symptoms. Unfortunately, the patient continues to smoke one half pack per day. She has white/brown sputum without hemoptysis. Evaluation in emergency department revealed possible left lower lobe opacity/atelectasis. The patient was started on vancomycin and ceftazidime. She improved clinically. The patient didn't have oxygen desaturation with ambulation. She will have vancomycin and ceftazidime dosed on dialysis after discharge as discussed above. The patient will need outpatient echocardiogram for evaluation of systolic murmur on examination.  Consultants: Renal  Discharge Exam: Filed Vitals:   11/09/15 0510  BP: 145/77  Pulse: 95  Temp: 98.1 F (36.7 C)  Resp: 18   Filed Vitals:   11/08/15 1308 11/08/15 2102 11/08/15 2115 11/09/15 0510  BP:  178/94  145/77  Pulse:  81  95  Temp:  98 F (36.7 C)  98.1 F (36.7 C)  TempSrc:  Oral  Oral  Resp:  18  18  Height:      Weight:    74.072 kg (163 lb 4.8 oz)  SpO2: 100% 100% 100% 100%   General: A&O x 3, NAD, pleasant, cooperative Cardiovascular: RRR, no rub, no gallop, no S3 Respiratory: Bibasilar rales  without wheezing Abdomen:soft, nontender, nondistended, positive  bowel sounds Extremities: No edema, No lymphangitis, no petechiae  Discharge Instructions      Discharge Instructions    Diet - low sodium heart healthy    Complete by:  As directed      Increase activity slowly    Complete by:  As directed             Medication List    STOP taking these medications        gabapentin 300 MG capsule  Commonly known as:  NEURONTIN     ibuprofen 200 MG tablet  Commonly known as:  ADVIL,MOTRIN      TAKE these medications        acetaminophen 500 MG tablet  Commonly known as:  TYLENOL  Take 1,500 mg by mouth every 8 (eight) hours as needed for moderate pain or headache.     albuterol 108 (90 BASE) MCG/ACT inhaler  Commonly known as:  PROVENTIL HFA;VENTOLIN HFA  Inhale 1-2 puffs into the lungs every 6 (six) hours as needed for wheezing or shortness of breath.     amLODipine 10 MG tablet  Commonly known as:  NORVASC  Take 10 mg by mouth at bedtime.     cefTAZidime 2 g in dextrose 5 % 50 mL  Inject 2 g into the vein every Monday, Wednesday, and Friday with hemodialysis. Stop after last dose on 11/14/15     cinacalcet 60 MG tablet  Commonly known as:  SENSIPAR  Take 60 mg by mouth at bedtime.     feeding supplement (NEPRO CARB STEADY) Liqd  Take 237 mLs by mouth 2 (two) times daily between meals.     folic acid-vitamin b complex-vitamin c-selenium-zinc 3 MG Tabs tablet  Take 1 tablet by mouth at bedtime.     losartan 100 MG tablet  Commonly known as:  COZAAR  Take 100 mg by mouth at bedtime.     pramipexole 0.125 MG tablet  Commonly known as:  MIRAPEX  Take 1 tablet (0.125 mg total) by mouth 3 (three) times daily.     pravastatin 20 MG tablet  Commonly known as:  PRAVACHOL  Take 20 mg by mouth at bedtime.     sevelamer carbonate 800 MG tablet  Commonly known as:  RENVELA  Take 2,400 mg by mouth 3 (three) times daily with meals.     traMADol 50 MG tablet  Commonly known as:  ULTRAM  Take 50 mg by mouth daily as needed  for moderate pain or severe pain.     Vancomycin 750 MG/150ML Soln  Commonly known as:  VANCOCIN  Inject 150 mLs (750 mg total) into the vein every Monday, Wednesday, and Friday with hemodialysis. Stop after last dose on 11/14/15         The results of significant diagnostics from this hospitalization (including imaging, microbiology, ancillary and laboratory) are listed below for reference.    Significant Diagnostic Studies: Dg Chest 2 View  11/06/2015  CLINICAL DATA:  59 year old with 1 day history of cough and shortness of breath. Current history end-stage renal disease on hemodialysis. EXAM: CHEST  2 VIEW COMPARISON:  06/10/2015 and earlier. FINDINGS: AP erect and lateral images were obtained. Cardiac silhouette mildly enlarged but stable, allowing for differences in technique. Thoracic aorta tortuous and atherosclerotic, unchanged. Hilar and mediastinal contours otherwise unremarkable. Streaky and patchy airspace opacities in the left lower lobe. Lungs otherwise clear. Mild pulmonary venous hypertension without overt edema. No pleural effusions.  Degenerative changes involving the lumbar spine. IMPRESSION: 1. Left lower lobe atelectasis versus bronchopneumonia. Bronchopneumonia is favored. 2. Stable cardiomegaly. Pulmonary venous hypertension without overt edema. Electronically Signed   By: Evangeline Dakin M.D.   On: 11/06/2015 18:32     Microbiology: Recent Results (from the past 240 hour(s))  MRSA PCR Screening     Status: None   Collection Time: 11/06/15  9:38 PM  Result Value Ref Range Status   MRSA by PCR NEGATIVE NEGATIVE Final    Comment:        The GeneXpert MRSA Assay (FDA approved for NASAL specimens only), is one component of a comprehensive MRSA colonization surveillance program. It is not intended to diagnose MRSA infection nor to guide or monitor treatment for MRSA infections.   Culture, blood (routine x 2) Call MD if unable to obtain prior to antibiotics being  given     Status: None (Preliminary result)   Collection Time: 11/06/15 10:05 PM  Result Value Ref Range Status   Specimen Description BLOOD RIGHT ARM  Final   Special Requests BOTTLES DRAWN AEROBIC AND ANAEROBIC 10CC  Final   Culture NO GROWTH 2 DAYS  Final   Report Status PENDING  Incomplete  Culture, blood (routine x 2) Call MD if unable to obtain prior to antibiotics being given     Status: None (Preliminary result)   Collection Time: 11/06/15 10:15 PM  Result Value Ref Range Status   Specimen Description BLOOD RIGHT ARM  Final   Special Requests BOTTLES DRAWN AEROBIC AND ANAEROBIC 10CC  Final   Culture NO GROWTH 2 DAYS  Final   Report Status PENDING  Incomplete  Culture, sputum-assessment     Status: None   Collection Time: 11/07/15  7:39 AM  Result Value Ref Range Status   Specimen Description SPUTUM  Final   Special Requests NONE  Final   Sputum evaluation   Final    MICROSCOPIC FINDINGS SUGGEST THAT THIS SPECIMEN IS NOT REPRESENTATIVE OF LOWER RESPIRATORY SECRETIONS. PLEASE RECOLLECT. Gram Stain Report Called to,Read Back By and Verified With: VErskine Emery AT [id number] ON [id number] BY Rhea Bleacher    Report Status 11/07/2015 FINAL  Final     Labs: Basic Metabolic Panel:  Recent Labs Lab 11/06/15 1621 11/07/15 0555 11/08/15 1630  NA 140 141 140  K 4.3 4.3 3.3*  CL 96* 100* 96*  CO2 30 30 31   GLUCOSE 89 79 93  BUN 27* 33* 21*  CREATININE 9.85* 10.54* 8.10*  CALCIUM 10.5* 9.3 9.9  PHOS  --  6.3* 4.5   Liver Function Tests:  Recent Labs Lab 11/06/15 1621 11/07/15 0555 11/08/15 1630  AST 25  --   --   ALT 23  --   --   ALKPHOS 89  --   --   BILITOT 0.5  --   --   PROT 6.8  --   --   ALBUMIN 3.3* 3.1* 3.2*   No results for input(s): LIPASE, AMYLASE in the last 168 hours. No results for input(s): AMMONIA in the last 168 hours. CBC:  Recent Labs Lab 11/06/15 1621 11/07/15 0555 11/08/15 1630  WBC 9.9 9.5 9.1  NEUTROABS 8.0* 6.8  --   HGB 10.9* 10.1* 10.3*    HCT 33.5* 31.6* 32.4*  MCV 92.3 92.9 92.0  PLT 251 239 264   Cardiac Enzymes:  Recent Labs Lab 11/07/15 1426 11/07/15 2056  TROPONINI 0.05* 0.04*   BNP: Invalid input(s): POCBNP CBG: No results for input(s): GLUCAP  in the last 168 hours.  Time coordinating discharge:  Greater than 30 minutes  Signed:  Taylen Osorto, DO [facility] Pager: [phone] 11/09/2015, 11:26 AM

## 2015-11-09 NOTE — Progress Notes (Signed)
ANTIBIOTIC CONSULT NOTE - INITIAL  Pharmacy Consult for Vancomycin Indication: rule out pneumonia  Allergies  Allergen Reactions  . Ace Inhibitors Anaphylaxis, Shortness Of Breath and Swelling    Tongue and throat swells, cannot breathe    Patient Measurements: Height: 5\' 3"  (160 cm) Weight: 163 lb 4.8 oz (74.072 kg) IBW/kg (Calculated) : 52.4 Adjusted Body Weight:   Vital Signs: Temp: 98.1 F (36.7 C) (11/13 0510) Temp Source: Oral (11/13 0510) BP: 145/77 mmHg (11/13 0510) Pulse Rate: 95 (11/13 0510) Intake/Output from previous day: 11/12 0701 - 11/13 0700 In: 422 [P.O.:422] Out: -  Intake/Output from this shift:    Labs:  Recent Labs  11/06/15 1621 11/07/15 0555 11/08/15 1630  WBC 9.9 9.5 9.1  HGB 10.9* 10.1* 10.3*  PLT 251 239 264  CREATININE 9.85* 10.54* 8.10*   Estimated Creatinine Clearance: 7.1 mL/min (by C-G formula based on Cr of 8.1). No results for input(s): VANCOTROUGH, VANCOPEAK, VANCORANDOM, GENTTROUGH, GENTPEAK, GENTRANDOM, TOBRATROUGH, TOBRAPEAK, TOBRARND, AMIKACINPEAK, AMIKACINTROU, AMIKACIN in the last 72 hours.   Microbiology: Recent Results (from the past 720 hour(s))  MRSA PCR Screening     Status: None   Collection Time: 11/06/15  9:38 PM  Result Value Ref Range Status   MRSA by PCR NEGATIVE NEGATIVE Final    Comment:        The GeneXpert MRSA Assay (FDA approved for NASAL specimens only), is one component of a comprehensive MRSA colonization surveillance program. It is not intended to diagnose MRSA infection nor to guide or monitor treatment for MRSA infections.   Culture, blood (routine x 2) Call MD if unable to obtain prior to antibiotics being given     Status: None (Preliminary result)   Collection Time: 11/06/15 10:05 PM  Result Value Ref Range Status   Specimen Description BLOOD RIGHT ARM  Final   Special Requests BOTTLES DRAWN AEROBIC AND ANAEROBIC 10CC  Final   Culture NO GROWTH 2 DAYS  Final   Report Status PENDING   Incomplete  Culture, blood (routine x 2) Call MD if unable to obtain prior to antibiotics being given     Status: None (Preliminary result)   Collection Time: 11/06/15 10:15 PM  Result Value Ref Range Status   Specimen Description BLOOD RIGHT ARM  Final   Special Requests BOTTLES DRAWN AEROBIC AND ANAEROBIC 10CC  Final   Culture NO GROWTH 2 DAYS  Final   Report Status PENDING  Incomplete  Culture, sputum-assessment     Status: None   Collection Time: 11/07/15  7:39 AM  Result Value Ref Range Status   Specimen Description SPUTUM  Final   Special Requests NONE  Final   Sputum evaluation   Final    MICROSCOPIC FINDINGS SUGGEST THAT THIS SPECIMEN IS NOT REPRESENTATIVE OF LOWER RESPIRATORY SECRETIONS. PLEASE RECOLLECT. Gram Stain Report Called to,Read Back By and Verified With: VErskine Emery AT [id number] ON [id number] BY Rhea Bleacher    Report Status 11/07/2015 FINAL  Final    Medical History: Past Medical History  Diagnosis Date  . Hypertension   . Hyperlipidemia   . Obesity   . Fibrosis of uterus   . Anemia   . Chronic bronchitis   . Hyperparathyroidism, secondary renal ([place])   . Shortness of breath dyspnea   . Depression   . GERD (gastroesophageal reflux disease)   . Family history of adverse reaction to anesthesia     "sister took a long long time to wake up"  . Membranoproliferative glomerulonephritis  type 1  . ESRD (end stage renal disease) on dialysis [facility], [place])     "[place]; off [employer]; MWF" (11/06/2015)  . NEPHROTIC SYNDROME 05/07/2010    Qualifier: Diagnosis of  By: Hollie Salk CMA, [name]    . Pneumonia ?01/2015; 06/09/2015; 11/06/2015  . Diabetes mellitus     "only when I take steroids" (11/06/2015)  . Daily headache   . Degenerative joint disease     "right knee" (11/06/2015)    Medications:  Prescriptions prior to admission  Medication Sig Dispense Refill Last Dose  . acetaminophen (TYLENOL) 500 MG tablet Take 1,500 mg by mouth every 8 (eight) hours as  needed for moderate pain or headache.    over 30 days  . albuterol (PROVENTIL HFA;VENTOLIN HFA) 108 (90 BASE) MCG/ACT inhaler Inhale 1-2 puffs into the lungs every 6 (six) hours as needed for wheezing or shortness of breath. 1 Inhaler 0 Past Week at Unknown time  . amLODipine (NORVASC) 10 MG tablet Take 10 mg by mouth at bedtime.    11/04/2015 at Unknown time  . cinacalcet (SENSIPAR) 60 MG tablet Take 60 mg by mouth at bedtime.    0000000  . folic acid-vitamin b complex-vitamin c-selenium-zinc (DIALYVITE) 3 MG TABS Take 1 tablet by mouth at bedtime.    11/04/2015  . gabapentin (NEURONTIN) 300 MG capsule Take 300 mg by mouth at bedtime.    11/04/2015  . ibuprofen (ADVIL,MOTRIN) 200 MG tablet Take 600 mg by mouth every 6 (six) hours as needed for moderate pain.   over 30 days  . losartan (COZAAR) 100 MG tablet Take 100 mg by mouth at bedtime.    11/04/2015  . pravastatin (PRAVACHOL) 20 MG tablet Take 20 mg by mouth at bedtime.    11/04/2015  . sevelamer carbonate (RENVELA) 800 MG tablet Take 2,400 mg by mouth 3 (three) times daily with meals.   11/04/2015  . traMADol (ULTRAM) 50 MG tablet Take 50 mg by mouth daily as needed for moderate pain or severe pain.    11/06/2015 at Unknown time   Scheduled:  . amLODipine  10 mg Oral QHS  . antiseptic oral rinse  7 mL Mouth Rinse BID  . vancomycin  750 mg Intravenous Q M,W,F-HD   And  . cefTAZidime (FORTAZ)  IV  2 g Intravenous Q M,W,F-HD  . cinacalcet  60 mg Oral QHS  . [START ON 11/14/2015] darbepoetin (ARANESP) injection - DIALYSIS  60 mcg Intravenous Q Fri-HD  . [START ON 11/10/2015] doxercalciferol  2 mcg Intravenous Q M,W,F-HD  . feeding supplement (NEPRO CARB STEADY)  237 mL Oral BID BM  . gabapentin  100 mg Oral QHS  . guaiFENesin  600 mg Oral BID  . heparin  4,000 Units Dialysis Once in dialysis  . heparin  5,000 Units Subcutaneous 3 times per day  . losartan  100 mg Oral QHS  . multivitamin with minerals  1 tablet Oral Daily  . nicotine  14 mg  Transdermal Daily  . pravastatin  20 mg Oral QHS  . sevelamer carbonate  2,400 mg Oral TID WC   Infusions:    Assessment: 59yo female with history of ESRD MWF (last session 11/11), HTN, and HLD presents with suspected unintentional overdose, cough and SOB. Pharmacy is consulted to dose vancomycin for suspected pneumonia. Received ceftazidime and vancomycin post-HD 11/11. Next session scheduled for 11/14. Remains afebrile, WBC 9.3, BCx NGTD.    Goal of Therapy:  Vancomycin trough level 15-20 mcg/ml  Plan:  Vancomycin  750mg  qHD Ceftazidime 2g qHD Expected duration 10 days (ending 11/21) with resolution of temperature and/or normalization of WBC Follow up culture results, HD plans and clinical course  Stephens November, PharmD PGY1 Resident Pager [phone] 11/09/2015,11:59 AM

## 2015-11-09 NOTE — Progress Notes (Signed)
PT Cancellation Note  Patient Details Name: Kim Hodge MRN: [id number] DOB: [birth year]   Cancelled Treatment:    Reason Eval/Treat Not Completed: Other (comment) (States she is fine, no Rehab needs).  Notified nursing and will return if pt changes her mind.   Ramond Dial 11/09/2015, 3:37 PM   Mee Hives, PT MS Acute Rehab Dept. Number: [facility] [id number] and [place] [phone]

## 2015-11-09 NOTE — Progress Notes (Signed)
Patient discharge teaching given, including activity, diet, follow-up appoints, and medications. Patient verbalized understanding of all discharge instructions. IV access was d/c'd. Vitals are stable. Skin is intact except as charted in most recent assessments. Pt to be escorted out by NT, to be driven home by self.

## 2015-11-11 ENCOUNTER — Ambulatory Visit
Admission: RE | Admit: 2015-11-11 | Discharge: 2015-11-11 | Disposition: A | Discharge status: Home / Self Care | Payer: Medicare Other | Source: Ambulatory Visit

## 2015-11-11 DIAGNOSIS — Z1231 Encounter for screening mammogram for malignant neoplasm of breast: Secondary | ICD-10-CM

## 2015-11-11 LAB — CULTURE, BLOOD (ROUTINE X 2)
Culture: NO GROWTH
Culture: NO GROWTH

## 2015-11-14 ENCOUNTER — Other Ambulatory Visit: Payer: Self-pay | Admitting: *Deleted

## 2015-11-14 NOTE — Patient Outreach (Signed)
[facility]) Care Management  11/14/2015  Kim Hodge June 09, 1956 [id number]   EMMI-pneumonia follow up: Red on dashboard for patient has not been to follow up appointment.  Telephone call to patient; HIPPA verification received.  Subjective:   Patient voices that she has appointment to see primary care doctor in follow up after hospital stay on 12/15/2015. States appointment was set up prior to her hospital discharge. However she realizes now that she will need to have appointment changed because she has hemodialysis on Monday, Wednesday and Friday and appointment falls on Monday. Advised patient to try to reschedule appointment for earlier appointment. Discharge instructions states to have follow up in 2 weeks post hospital discharge. Patient states she will call office to reschedule appointment. Patient advised to request to have her name placed on list to be called if someone cancels in order to get earlier appointment if needed. Patient voices understanding and will call to make appointment. States no transportation problems.  Patient voices she has had no trouble breathing, no fever, no increase in sputum production or chest pain since discharge from hospital with pneumonia.  Patient was advised of symptoms to report to doctor and symptoms that require emergency treatment -911. Voices understanding. States she has completed antibiotic dosage which was administered during hemodialysis treatment. States she is taking medications as directed by doctor and has no problem getting medications.  States she is up to date on flu and pneumonia vaccines which she receives at dialysis center.  Objective:  See medication list as noted. See intake assessment as noted.  Assessment:  Patient independent with personal  & health care concerns. Adherent with taking medications as precribed. No transportation problems. Has working knowledge reportable signs & symptoms ad action  plan. Up to date on flu and pneumonia vaccines  Plan: will close out Emmi-pneumonia dashboard follow up. Will notify care management assistant.

## 2015-12-09 ENCOUNTER — Telehealth: Payer: Self-pay

## 2015-12-09 NOTE — Telephone Encounter (Signed)
-----   Message from Gatha Mayer, MD sent at 12/09/2015  1:45 PM EST ----- Regarding: ? can be direct appt says he is on heparin  I suspect that is for hemodialysis  If so seems like he can be a direct as I do not see any anti-coagulants on his list

## 2015-12-09 NOTE — Telephone Encounter (Signed)
Patient notified Heparin with dialysis only Scheduled for colon 02/17/16 and pre-visit 01/27/16

## 2015-12-15 ENCOUNTER — Ambulatory Visit: Payer: PRIVATE HEALTH INSURANCE | Admitting: Internal Medicine

## 2016-01-27 ENCOUNTER — Ambulatory Visit (AMBULATORY_SURGERY_CENTER): Payer: Self-pay | Admitting: *Deleted

## 2016-01-27 VITALS — Ht 63.0 in | Wt 169.0 lb

## 2016-01-27 DIAGNOSIS — Z1211 Encounter for screening for malignant neoplasm of colon: Secondary | ICD-10-CM

## 2016-01-27 MED ORDER — POLYETHYLENE GLYCOL 3350 17 GM/SCOOP PO POWD: 1.0000 | Freq: Every day | ORAL | Status: DC

## 2016-01-27 MED ORDER — BISACODYL 5 MG PO TBEC: 5.0000 mg | DELAYED_RELEASE_TABLET | Freq: Once | ORAL | Status: DC

## 2016-01-27 NOTE — Progress Notes (Signed)
No egg or soy allergy known to patient  No issues with past sedation with any surgeries  or procedures, no intubation problems  No diet pills per patient No home 02 use per patient  No blood thinners per patient  Pt has left av fistula- no bp sticks left arm

## 2016-02-17 ENCOUNTER — Encounter: Payer: Self-pay | Admitting: Internal Medicine

## 2016-02-17 ENCOUNTER — Ambulatory Visit (AMBULATORY_SURGERY_CENTER): Payer: Medicare Other | Admitting: Internal Medicine

## 2016-02-17 VITALS — BP 141/75 | HR 70 | Temp 97.5°F | Resp 13 | Ht 63.0 in | Wt 169.0 lb

## 2016-02-17 DIAGNOSIS — Z1211 Encounter for screening for malignant neoplasm of colon: Secondary | ICD-10-CM | POA: Diagnosis not present

## 2016-02-17 DIAGNOSIS — D120 Benign neoplasm of cecum: Secondary | ICD-10-CM

## 2016-02-17 DIAGNOSIS — D125 Benign neoplasm of sigmoid colon: Secondary | ICD-10-CM

## 2016-02-17 DIAGNOSIS — K635 Polyp of colon: Secondary | ICD-10-CM

## 2016-02-17 MED ORDER — SODIUM CHLORIDE 0.9 % IV SOLN
500.0000 mL | INTRAVENOUS | Status: DC
Start: 2016-02-17 — End: 2016-02-18

## 2016-02-17 NOTE — Op Note (Signed)
[place]  [address] [place], [id number]   COLONOSCOPY PROCEDURE REPORT  PATIENT: Kim Hodge, Kim Hodge  MR#: [id number] BIRTHDATE: May 02, 1956 , 60  yrs. old GENDER: female ENDOSCOPIST: Gatha Mayer, MD, [facility] PROCEDURE DATE:  02/17/2016 PROCEDURE:   Colonoscopy, screening and Colonoscopy with snare polypectomy First Screening Colonoscopy - Avg.  risk and is 50 yrs.  old or older - No.  Prior Negative Screening - Now for repeat screening. 10 or more years since last screening  History of Adenoma - Now for follow-up colonoscopy & has been > or = to 3 yrs.  N/A  Polyps removed today? Yes ASA CLASS:   Class III INDICATIONS:Screening for colonic neoplasia and Colorectal Neoplasm Risk Assessment for this procedure is average risk. MEDICATIONS: Propofol 240 mg IV and Monitored anesthesia care  DESCRIPTION OF PROCEDURE:   After the risks benefits and alternatives of the procedure were thoroughly explained, informed consent was obtained.  The digital rectal exam revealed no abnormalities of the rectum.   The [id number]  endoscope was introduced through the anus and advanced to the cecum, which was identified by both the appendix and ileocecal valve. No adverse events experienced.   The quality of the prep was excellent. (MiraLax was used)  The instrument was then slowly withdrawn as the colon was fully examined. Estimated blood loss is zero unless otherwise noted in this procedure report.  COLON FINDINGS: Two smooth sessile polyps ranging from 10 to 31mm in size were found at the cecum.  Polypectomies were performed using snare cautery.  The resection was complete, the polyp tissue was completely retrieved and sent to histology.   Two smooth sessile polyps ranging from 4 to 44mm in size were found in the sigmoid colon.  Polypectomies were performed with a cold snare.  The resection was complete, the polyp tissue was completely retrieved and sent to histology.    There was mild diverticulosis noted in the left colon.   External and internal hemorrhoids were found.   The examination was otherwise normal.  Retroflexed views revealed internal hemorrhoids and Retroflexed views revealed external hemorrhoids. The time to cecum = 4.9 Withdrawal time = 21.9   The scope was withdrawn and the procedure completed. COMPLICATIONS: There were no immediate complications.  ENDOSCOPIC IMPRESSION: 1.   Two sessile polyps ranging from 10 to 55mm in size were found at the cecum; polypectomies were performed using snare cautery 2.   Two sessile polyps ranging from 4 to 22mm in size were found in the sigmoid colon; polypectomies were performed with a cold snare 3.   There was mild diverticulosis noted in the left colon 4.   External and internal hemorrhoids 5.   The examination was otherwise normal  RECOMMENDATIONS: 1.  Hold Aspirin and all other NSAIDS for 2 weeks. 2.  Timing of repeat colonoscopy will be determined by pathology findings.  eSigned:  Gatha Mayer, MD, [facility] 02/17/2016 2:05 PM   cc: The Patient and Dr. Pricilla Holm   PATIENT NAME:  Kim Hodge, Kim Hodge MR#: [id number]

## 2016-02-17 NOTE — Patient Instructions (Addendum)
I found and removed 3 polyps - all look benign. I will let you know pathology results and when to have another routine colonoscopy by mail.  I appreciate the opportunity to care for you. Gatha Mayer, MD, FACG  YOU HAD AN ENDOSCOPIC PROCEDURE TODAY AT [facility]:   Refer to the procedure report that was given to you for any specific questions about what was found during the examination.  If the procedure report does not answer your questions, please call your gastroenterologist to clarify.  If you requested that your care partner not be given the details of your procedure findings, then the procedure report has been included in a sealed envelope for you to review at your convenience later.  YOU SHOULD EXPECT: Some feelings of bloating in the abdomen. Passage of more gas than usual.  Walking can help get rid of the air that was put into your GI tract during the procedure and reduce the bloating. If you had a lower endoscopy (such as a colonoscopy or flexible sigmoidoscopy) you may notice spotting of blood in your stool or on the toilet paper. If you underwent a bowel prep for your procedure, you may not have a normal bowel movement for a few days.  Please Note:  You might notice some irritation and congestion in your nose or some drainage.  This is from the oxygen used during your procedure.  There is no need for concern and it should clear up in a day or so.  SYMPTOMS TO REPORT IMMEDIATELY:   Following lower endoscopy (colonoscopy or flexible sigmoidoscopy):  Excessive amounts of blood in the stool  Significant tenderness or worsening of abdominal pains  Swelling of the abdomen that is new, acute  Fever of 100F or higher  For urgent or emergent issues, a gastroenterologist can be reached at any hour by calling [phone].   DIET: Your first meal following the procedure should be a small meal and then it is ok to progress to your normal diet. Heavy or fried  foods are harder to digest and may make you feel nauseous or bloated.  Likewise, meals heavy in dairy and vegetables can increase bloating.  Drink plenty of fluids but you should avoid alcoholic beverages for 24 hours.  ACTIVITY:  You should plan to take it easy for the rest of today and you should NOT DRIVE or use heavy machinery until tomorrow (because of the sedation medicines used during the test).    FOLLOW UP: Our staff will call the number listed on your records the next business day following your procedure to check on you and address any questions or concerns that you may have regarding the information given to you following your procedure. If we do not reach you, we will leave a message.  However, if you are feeling well and you are not experiencing any problems, there is no need to return our call.  We will assume that you have returned to your regular daily activities without incident.  If any biopsies were taken you will be contacted by phone or by letter within the next 1-3 weeks.  Please call us at [phone] if you have not heard about the biopsies in 3 weeks.    SIGNATURES/CONFIDENTIALITY: You and/or your care partner have signed paperwork which will be entered into your electronic medical record.  These signatures attest to the fact that that the information above on your After Visit Summary has been reviewed and is understood.  Full responsibility of the confidentiality of this discharge information lies with you and/or your care-partner.  Polyp, diverticulosis and hemorrhoid information given-hemorrhoid banding.   No aspirin or NSAIDS for 2 weeks.

## 2016-02-17 NOTE — Progress Notes (Signed)
Report to PACU, RN, vss, BBS= Clear.  

## 2016-02-17 NOTE — Progress Notes (Signed)
Called to room to assist during endoscopic procedure.  Patient ID and intended procedure confirmed with present staff. Received instructions for my participation in the procedure from the performing physician.  

## 2016-02-18 ENCOUNTER — Telehealth: Payer: Self-pay

## 2016-02-18 NOTE — Telephone Encounter (Signed)
  Follow up Call-  Call back number 02/17/2016  Post procedure Call Back phone  # [phone]  Permission to leave phone message Yes     Patient questions:  Do you have a fever, pain , or abdominal swelling? No. Pain Score  0 *  Have you tolerated food without any problems? Yes.    Have you been able to return to your normal activities? Yes.    Do you have any questions about your discharge instructions: Diet   No. Medications  No. Follow up visit  No.  Do you have questions or concerns about your Care? No.  Actions: * If pain score is 4 or above: No action needed, pain <4.

## 2016-02-25 ENCOUNTER — Encounter: Payer: Self-pay | Admitting: Internal Medicine

## 2016-02-25 DIAGNOSIS — Z860100 Personal history of colon polyps, unspecified: Secondary | ICD-10-CM | POA: Insufficient documentation

## 2016-02-25 HISTORY — DX: Personal history of colon polyps, unspecified: Z86.0100

## 2016-02-25 HISTORY — DX: Personal history of colonic polyps: Z86.010

## 2016-02-25 NOTE — Progress Notes (Signed)
Quick Note:  2 serrated polyps of cecum 10 and 18 mm 2 diminutive sigmoid hyperplastic polyps Recall 2020 ______

## 2016-06-03 ENCOUNTER — Encounter: Payer: Self-pay | Admitting: Vascular Surgery

## 2016-06-08 ENCOUNTER — Encounter: Payer: Self-pay | Admitting: Internal Medicine

## 2016-06-08 ENCOUNTER — Telehealth: Payer: Self-pay | Admitting: *Deleted

## 2016-06-08 ENCOUNTER — Other Ambulatory Visit (INDEPENDENT_AMBULATORY_CARE_PROVIDER_SITE_OTHER): Payer: Medicare Other

## 2016-06-08 ENCOUNTER — Ambulatory Visit (INDEPENDENT_AMBULATORY_CARE_PROVIDER_SITE_OTHER): Payer: Medicare Other | Admitting: Internal Medicine

## 2016-06-08 ENCOUNTER — Ambulatory Visit (INDEPENDENT_AMBULATORY_CARE_PROVIDER_SITE_OTHER)
Admission: RE | Admit: 2016-06-08 | Discharge: 2016-06-08 | Disposition: A | Discharge status: Home / Self Care | Payer: Medicare Other | Source: Ambulatory Visit | Attending: Internal Medicine | Admitting: Internal Medicine

## 2016-06-08 VITALS — BP 122/82 | HR 80 | Temp 98.5°F | Resp 16 | Ht 63.0 in | Wt 172.0 lb

## 2016-06-08 DIAGNOSIS — Z720 Tobacco use: Secondary | ICD-10-CM | POA: Diagnosis not present

## 2016-06-08 DIAGNOSIS — E785 Hyperlipidemia, unspecified: Secondary | ICD-10-CM | POA: Diagnosis not present

## 2016-06-08 DIAGNOSIS — J189 Pneumonia, unspecified organism: Secondary | ICD-10-CM

## 2016-06-08 DIAGNOSIS — J181 Lobar pneumonia, unspecified organism: Secondary | ICD-10-CM | POA: Diagnosis not present

## 2016-06-08 DIAGNOSIS — H6091 Unspecified otitis externa, right ear: Secondary | ICD-10-CM | POA: Insufficient documentation

## 2016-06-08 LAB — COMPREHENSIVE METABOLIC PANEL
ALBUMIN: 4.1 g/dL (ref 3.5–5.2)
ALK PHOS: 122 U/L — AB (ref 39–117)
ALT: 15 U/L (ref 0–35)
AST: 20 U/L (ref 0–37)
BUN: 47 mg/dL — AB (ref 6–23)
CHLORIDE: 95 meq/L — AB (ref 96–112)
CO2: 31 mEq/L (ref 19–32)
Calcium: 9.4 mg/dL (ref 8.4–10.5)
Creatinine, Ser: 10.37 mg/dL (ref 0.40–1.20)
GFR: 4.87 mL/min — AB (ref >60.00)
Glucose, Bld: 97 mg/dL (ref 70–99)
POTASSIUM: 4.3 meq/L (ref 3.5–5.1)
SODIUM: 138 meq/L (ref 135–145)
Total Bilirubin: 0.4 mg/dL (ref 0.2–1.2)
Total Protein: 7.9 g/dL (ref 6.0–8.3)

## 2016-06-08 LAB — LIPID PANEL
CHOLESTEROL: 220 mg/dL — AB (ref 0–200)
HDL: 47.8 mg/dL (ref >39.00)
LDL CALC: 147 mg/dL — AB (ref 0–99)
NonHDL: 171.79
Total CHOL/HDL Ratio: 5
Triglycerides: 125 mg/dL (ref 0.0–149.0)
VLDL: 25 mg/dL (ref 0.0–40.0)

## 2016-06-08 MED ORDER — NEOMYCIN-POLYMYXIN-HC 3.5-10000-1 OP SUSP: 3.0000 [drp] | Freq: Two times a day (BID) | OPHTHALMIC | Status: DC

## 2016-06-08 NOTE — Progress Notes (Signed)
Pre visit review using our clinic review tool, if applicable. No additional management support is needed unless otherwise documented below in the visit note. 

## 2016-06-08 NOTE — Assessment & Plan Note (Signed)
Insurance will not cover corticosporin otic but does cover opth which is identical so will rx and instruct to use in right ear.

## 2016-06-08 NOTE — Patient Instructions (Addendum)
We will check the chest x-ray today to make sure the pneumonia is gone.   We have put in the labs to do before our physical.   Come back for the physical.   We have sent in the ear drops for you that you can use 3 drops twice a day in the right ear for the next 3 days. It says for eye but this is for your ear as we talked about.

## 2016-06-08 NOTE — Telephone Encounter (Signed)
Lab called: Creatinine is critical at 10.18. GFR is 4.81.

## 2016-06-08 NOTE — Telephone Encounter (Signed)
Expected, patient on dialysis.

## 2016-06-08 NOTE — Assessment & Plan Note (Signed)
Counseled her to quit smoking but she does not feel able at this time. She would love to quit.

## 2016-06-08 NOTE — Progress Notes (Signed)
   Subjective:    Patient ID: Kim Hodge, female    DOB: 18-Feb-1956, 60 y.o.   MRN: [id number]  HPI The patient is a 60 YO female coming in for follow up on several issues including pneumonia (treated back in November and never followed up, has chronic cough which is unchanged lately, she feels better after the treatment, no follow up cxr), her sinuses (having new right ear pain and jaw, bad right now, taking allegra, some sinus drainage, no pressure in her sinuses), and her cholesterol (levels not checked in some time, taking pravastatin without side effects). She is also still having her hernia on her stomach and it is about the same as last year. Has some hemorrhoids as well that are causing her problems from time to time.   Review of Systems  Constitutional: Positive for fatigue. Negative for fever, chills, activity change and appetite change.  HENT: Positive for congestion, ear pain and rhinorrhea. Negative for facial swelling, postnasal drip, sinus pressure, sore throat and trouble swallowing.   Eyes: Negative.   Respiratory: Positive for cough. Negative for chest tightness, shortness of breath and wheezing.        Chronic stable.   Cardiovascular: Negative for chest pain, palpitations and leg swelling.  Gastrointestinal: Positive for diarrhea, constipation and abdominal distention. Negative for abdominal pain.       Stable  Musculoskeletal: Negative.   Skin: Negative.   Neurological: Negative for dizziness, seizures, speech difficulty and weakness.  Psychiatric/Behavioral: Negative.       Objective:   Physical Exam  Constitutional: She is oriented to person, place, and time. She appears well-developed and well-nourished.  HENT:  Head: Normocephalic and atraumatic.  Ears with clear TMs bilaterally right otitis extrerna, oropharynx with mild erythema and turbinates with swelling and redness.   Eyes: EOM are normal.  Neck: Normal range of motion.  Cardiovascular: Normal rate and  regular rhythm.   Pulmonary/Chest: Effort normal and breath sounds normal. No respiratory distress. She has no wheezes.  Abdominal: Soft. She exhibits no distension. There is no tenderness. There is no rebound.  3-4 cm umbilical hernia, easily reducible.   Musculoskeletal: She exhibits no edema.  Lymphadenopathy:    She has no cervical adenopathy.  Neurological: She is alert and oriented to person, place, and time. Coordination normal.  Skin: Skin is warm and dry.   Filed Vitals:   06/08/16 0804  BP: 122/82  Pulse: 80  Temp: 98.5 F (36.9 C)  TempSrc: Oral  Resp: 16  Height: 5\' 3"  (1.6 m)  Weight: 172 lb (78.019 kg)  SpO2: 95%      Assessment & Plan:

## 2016-06-08 NOTE — Assessment & Plan Note (Signed)
Checking lipid panel and adjust as needed. Taking pravastatin daily without side effects.

## 2016-06-08 NOTE — Assessment & Plan Note (Signed)
Checking CXR for resolution today. Finished all of her antibiotics. She is still smoking and we talked about how this increases her risk of lung problems and disease and cancer. She is not able to quit right now.

## 2016-06-10 ENCOUNTER — Ambulatory Visit: Payer: Medicare Other | Admitting: Vascular Surgery

## 2016-06-14 ENCOUNTER — Encounter: Payer: Self-pay | Admitting: Vascular Surgery

## 2016-06-15 ENCOUNTER — Ambulatory Visit (INDEPENDENT_AMBULATORY_CARE_PROVIDER_SITE_OTHER): Payer: Medicare Other | Admitting: Vascular Surgery

## 2016-06-15 ENCOUNTER — Encounter: Payer: Self-pay | Admitting: Vascular Surgery

## 2016-06-15 VITALS — BP 120/81 | HR 75 | Temp 97.4°F | Resp 16 | Ht 63.0 in | Wt 171.0 lb

## 2016-06-15 DIAGNOSIS — Z992 Dependence on renal dialysis: Secondary | ICD-10-CM

## 2016-06-15 DIAGNOSIS — N186 End stage renal disease: Secondary | ICD-10-CM | POA: Diagnosis not present

## 2016-06-15 NOTE — Progress Notes (Signed)
Subjective:     Patient ID: Kim Hodge, female   DOB: [birth year], 60 y.o.   MRN: [id number]  HPI this 60 year old female with end-stage renal disease has been on dialysis for 8 years. She currently has a left upper arm AV graft. She previously had a left upper arm brachial-cephalic AV fistula. She has an area of dilatation on the lateral half of the graft and was referred for evaluation. She denies any history of bleeding from this area. She has had no infection or drainage. Graft is functioning satisfactorily. She dialyzes Monday Wednesday Friday. She denies any pain or numbness in the left hand.  Past Medical History  Diagnosis Date  . Hypertension   . Hyperlipidemia   . Obesity   . Fibrosis of uterus   . Anemia   . Chronic bronchitis   . Hyperparathyroidism, secondary renal ([facility])   . Shortness of breath dyspnea   . Depression   . GERD (gastroesophageal reflux disease)   . Family history of adverse reaction to anesthesia     "sister took a long long time to wake up"  . Membranoproliferative glomerulonephritis     type 1  . ESRD (end stage renal disease) on dialysis [facility])     "[facility]; off [employer]; MWF" (11/06/2015)  . NEPHROTIC SYNDROME 05/07/2010    Qualifier: Diagnosis of  By: Hollie Salk CMA, [name]    . Pneumonia ?01/2015; 06/09/2015; 11/06/2015  . Daily headache   . Degenerative joint disease     "right knee" (11/06/2015)  . Diabetes mellitus     "only when I take steroids" (11/06/2015)  . Neuromuscular disorder (H[facility])     neuropathy  . Hx of colonic polyps 02/25/2016    Social History  Substance Use Topics  . Smoking status: Current Some Day Smoker -- 0.33 packs/day for 44 years    Types: Cigarettes  . Smokeless tobacco: Never Used  . Alcohol Use: No    Family History  Problem Relation Age of Onset  . Cancer Mother     breast  . Breast cancer Mother   . Stroke Father   . Colon cancer Neg Hx   . Colon polyps Neg Hx     Allergies  Allergen  Reactions  . Ace Inhibitors Anaphylaxis, Shortness Of Breath and Swelling    Tongue and throat swells, cannot breathe     Current outpatient prescriptions:  .  acetaminophen (TYLENOL) 500 MG tablet, Take 1,500 mg by mouth every 8 (eight) hours as needed for moderate pain or headache. Reported on 06/08/2016, Disp: , Rfl:  .  albuterol (PROVENTIL HFA;VENTOLIN HFA) 108 (90 BASE) MCG/ACT inhaler, Inhale 1-2 puffs into the lungs every 6 (six) hours as needed for wheezing or shortness of breath., Disp: 1 Inhaler, Rfl: 0 .  amLODipine (NORVASC) 10 MG tablet, Take 5 mg by mouth at bedtime. Per patient taking 5 mg po daily, Disp: , Rfl:  .  cinacalcet (SENSIPAR) 60 MG tablet, Take 90 mg by mouth at bedtime. Reported on 06/08/2016, Disp: , Rfl:  .  cinacalcet (SENSIPAR) 90 MG tablet, Take 90 mg by mouth daily., Disp: , Rfl:  .  famotidine (ACID REDUCER MAXIMUM STRENGTH) 20 MG tablet, Take 20 mg by mouth as needed for heartburn or indigestion., Disp: , Rfl:  .  Ferric Citrate (AURYXIA) 1 GM 210 MG(Fe) TABS, Take by mouth. Two with each meal., Disp: , Rfl:  .  fexofenadine (ALLEGRA) 180 MG tablet, Take 180 mg by mouth daily., Disp: ,  Rfl:  .  folic acid-vitamin b complex-vitamin c-selenium-zinc (DIALYVITE) 3 MG TABS, Take by mouth at bedtime. 800 mg daily, Disp: , Rfl:  .  gabapentin (NEURONTIN) 300 MG capsule, Take 300 mg by mouth daily., Disp: , Rfl:  .  losartan (COZAAR) 100 MG tablet, Take 100 mg by mouth at bedtime. , Disp: , Rfl:  .  pravastatin (PRAVACHOL) 20 MG tablet, Take 20 mg by mouth at bedtime. Reported on 01/27/2016, Disp: , Rfl:  .  calcium acetate (PHOSLO) 667 MG capsule, Take by mouth 3 (three) times daily with meals. Reported on 06/15/2016, Disp: , Rfl:  .  neomycin-polymyxin-hydrocortisone (CORTISPORIN) 3.5-10000-1 ophthalmic suspension, Place 3 drops into the right eye 2 (two) times daily. (Patient not taking: Reported on 06/15/2016), Disp: 7.5 mL, Rfl: 0 .  Nutritional Supplements (FEEDING  SUPPLEMENT, NEPRO CARB STEADY,) LIQD, Take 237 mLs by mouth 2 (two) times daily between meals. (Patient not taking: Reported on 06/15/2016), Disp: 60 Can, Rfl: 0 .  pramipexole (MIRAPEX) 0.125 MG tablet, Take 1 tablet (0.125 mg total) by mouth 3 (three) times daily. (Patient not taking: Reported on 06/15/2016), Disp: 30 tablet, Rfl: 0 .  sevelamer carbonate (RENVELA) 800 MG tablet, Take 2,400 mg by mouth 3 (three) times daily with meals. Reported on 06/15/2016, Disp: , Rfl:  .  traMADol (ULTRAM) 50 MG tablet, Take 50 mg by mouth daily as needed for moderate pain or severe pain. Reported on 06/15/2016, Disp: , Rfl:   Filed Vitals:   06/15/16 0921  BP: 120/81  Pulse: 75  Temp: 97.4 F (36.3 C)  Resp: 16  Height: 5\' 3"  (1.6 m)  Weight: 171 lb (77.565 kg)  SpO2: 96%    Body mass index is 30.3 kg/(m^2).         Review of Systems denies chest pain, dyspnea on exertion, PND, orthopnea, hemoptysis     Objective:   Physical Exam BP 120/81 mmHg  Pulse 75  Temp(Src) 97.4 F (36.3 C)  Resp 16  Ht 5\' 3"  (1.6 m)  Wt 171 lb (77.565 kg)  BMI 30.30 kg/m2  SpO2 96%  Gen. well-developed well-nourished female no apparent distress alert and oriented 3 Lungs no rhonchi or wheezing Left upper extremity with brachial-axillary loop AV graft with arterial anastomosis and distal upper arm. Focal area of dilatation at the 4:00 position with some thinning of the skin. There is no ulceration involving the skin which is quite smooth. Maximum diameter is about 2-1/2-3 cm. It is nontender to palpation. There is excellent pulse and palpable thrill in the graft. 2+ radial pulse palpable distally with well-perfused left hand.     Assessment:     Area of dilatation and thinning of the skin left upper arm AV graft which is functioning nicely-no history of bleeding or infection or ulceration  I discussed at length this with the patient and offered her the option of placing that half of the loop versus further  observation and she preferred not to have surgery performed at this time which I thank you is reasonable since she's had no bleeding oral serration    Plan:     Patient will monitor the area of dilatation and if it enlarges significantly she will be in touch with [place] to replace that half of the loop Obviously if she has any episodes of bleeding or drainage from this area should be replaced. Would be happy to schedule that at any time if she desires to proceed

## 2016-06-16 ENCOUNTER — Encounter: Payer: Self-pay | Admitting: Nephrology

## 2016-07-01 ENCOUNTER — Ambulatory Visit (INDEPENDENT_AMBULATORY_CARE_PROVIDER_SITE_OTHER): Payer: Medicare Other | Admitting: Internal Medicine

## 2016-07-01 ENCOUNTER — Encounter: Payer: Self-pay | Admitting: Internal Medicine

## 2016-07-01 VITALS — BP 98/58 | HR 95 | Temp 98.3°F | Resp 18 | Ht 63.0 in | Wt 176.0 lb

## 2016-07-01 DIAGNOSIS — Z0000 Encounter for general adult medical examination without abnormal findings: Secondary | ICD-10-CM | POA: Diagnosis not present

## 2016-07-01 DIAGNOSIS — Z720 Tobacco use: Secondary | ICD-10-CM

## 2016-07-01 DIAGNOSIS — Z23 Encounter for immunization: Secondary | ICD-10-CM | POA: Diagnosis not present

## 2016-07-01 MED ORDER — CELECOXIB 200 MG PO CAPS: 200.0000 mg | ORAL_CAPSULE | Freq: Two times a day (BID) | ORAL | Status: DC

## 2016-07-01 MED ORDER — ZOSTER VACCINE LIVE 19400 UNT/0.65ML ~~LOC~~ SUSR: 0.6500 mL | Freq: Once | SUBCUTANEOUS | Status: DC

## 2016-07-01 NOTE — Patient Instructions (Signed)
We have given you the tetanus shot today.   We have given you the prescription for the shingles shot and you can get that anytime.   Think about doing some exercise 3-4 days per week for 15-20 minutes.   Health Maintenance, Female Adopting a healthy lifestyle and getting preventive care can go a long way to promote health and wellness. Talk with your health care provider about what schedule of regular examinations is right for you. This is a good chance for you to check in with your provider about disease prevention and staying healthy. In between checkups, there are plenty of things you can do on your own. Experts have done a lot of research about which lifestyle changes and preventive measures are most likely to keep you healthy. Ask your health care provider for more information. WEIGHT AND DIET  Eat a healthy diet  Be sure to include plenty of vegetables, fruits, low-fat dairy products, and lean protein.  Do not eat a lot of foods high in solid fats, added sugars, or salt.  Get regular exercise. This is one of the most important things you can do for your health.  Most adults should exercise for at least 150 minutes each week. The exercise should increase your heart rate and make you sweat (moderate-intensity exercise).  Most adults should also do strengthening exercises at least twice a week. This is in addition to the moderate-intensity exercise.  Maintain a healthy weight  Body mass index (BMI) is a measurement that can be used to identify possible weight problems. It estimates body fat based on height and weight. Your health care provider can help determine your BMI and help you achieve or maintain a healthy weight.  For females 85 years of age and older:   A BMI below 18.5 is considered underweight.  A BMI of 18.5 to 24.9 is normal.  A BMI of 25 to 29.9 is considered overweight.  A BMI of 30 and above is considered obese.  Watch levels of cholesterol and blood  lipids  You should start having your blood tested for lipids and cholesterol at 61 years of age, then have this test every 5 years.  You may need to have your cholesterol levels checked more often if:  Your lipid or cholesterol levels are high.  You are older than 61 years of age.  You are at high risk for heart disease.  CANCER SCREENING   Lung Cancer  Lung cancer screening is recommended for adults 69-84 years old who are at high risk for lung cancer because of a history of smoking.  A yearly low-dose CT scan of the lungs is recommended for people who:  Currently smoke.  Have quit within the past 15 years.  Have at least a 30-pack-year history of smoking. A pack year is smoking an average of one pack of cigarettes a day for 1 year.  Yearly screening should continue until it has been 15 years since you quit.  Yearly screening should stop if you develop a health problem that would prevent you from having lung cancer treatment.  Breast Cancer  Practice breast self-awareness. This means understanding how your breasts normally appear and feel.  It also means doing regular breast self-exams. Let your health care provider know about any changes, no matter how small.  If you are in your 20s or 30s, you should have a clinical breast exam (CBE) by a health care provider every 1-3 years as part of a regular health exam.  If you are 40 or older, have a CBE every year. Also consider having a breast X-ray (mammogram) every year.  If you have a family history of breast cancer, talk to your health care provider about genetic screening.  If you are at high risk for breast cancer, talk to your health care provider about having an MRI and a mammogram every year.  Breast cancer gene (BRCA) assessment is recommended for women who have family members with BRCA-related cancers. BRCA-related cancers include:  Breast.  Ovarian.  Tubal.  Peritoneal cancers.  Results of the assessment  will determine the need for genetic counseling and BRCA1 and BRCA2 testing. Cervical Cancer Your health care provider may recommend that you be screened regularly for cancer of the pelvic organs (ovaries, uterus, and vagina). This screening involves a pelvic examination, including checking for microscopic changes to the surface of your cervix (Pap test). You may be encouraged to have this screening done every 3 years, beginning at age 24.  For women ages 59-65, health care providers may recommend pelvic exams and Pap testing every 3 years, or they may recommend the Pap and pelvic exam, combined with testing for human papilloma virus (HPV), every 5 years. Some types of HPV increase your risk of cervical cancer. Testing for HPV may also be done on women of any age with unclear Pap test results.  Other health care providers may not recommend any screening for nonpregnant women who are considered low risk for pelvic cancer and who do not have symptoms. Ask your health care provider if a screening pelvic exam is right for you.  If you have had past treatment for cervical cancer or a condition that could lead to cancer, you need Pap tests and screening for cancer for at least 20 years after your treatment. If Pap tests have been discontinued, your risk factors (such as having a new sexual partner) need to be reassessed to determine if screening should resume. Some women have medical problems that increase the chance of getting cervical cancer. In these cases, your health care provider may recommend more frequent screening and Pap tests. Colorectal Cancer  This type of cancer can be detected and often prevented.  Routine colorectal cancer screening usually begins at 61 years of age and continues through 61 years of age.  Your health care provider may recommend screening at an earlier age if you have risk factors for colon cancer.  Your health care provider may also recommend using home test kits to check  for hidden blood in the stool.  A small camera at the end of a tube can be used to examine your colon directly (sigmoidoscopy or colonoscopy). This is done to check for the earliest forms of colorectal cancer.  Routine screening usually begins at age 26.  Direct examination of the colon should be repeated every 5-10 years through 61 years of age. However, you may need to be screened more often if early forms of precancerous polyps or small growths are found. Skin Cancer  Check your skin from head to toe regularly.  Tell your health care provider about any new moles or changes in moles, especially if there is a change in a mole's shape or color.  Also tell your health care provider if you have a mole that is larger than the size of a pencil eraser.  Always use sunscreen. Apply sunscreen liberally and repeatedly throughout the day.  Protect yourself by wearing long sleeves, pants, a wide-brimmed hat, and sunglasses whenever you  are outside. HEART DISEASE, DIABETES, AND HIGH BLOOD PRESSURE   High blood pressure causes heart disease and increases the risk of stroke. High blood pressure is more likely to develop in:  People who have blood pressure in the high end of the normal range (130-139/85-89 mm Hg).  People who are overweight or obese.  People who are African American.  If you are 18-39 years of age, have your blood pressure checked every 3-5 years. If you are 40 years of age or older, have your blood pressure checked every year. You should have your blood pressure measured twice--once when you are at a hospital or clinic, and once when you are not at a hospital or clinic. Record the average of the two measurements. To check your blood pressure when you are not at a hospital or clinic, you can use:  An automated blood pressure machine at a pharmacy.  A home blood pressure monitor.  If you are between 55 years and 79 years old, ask your health care provider if you should take  aspirin to prevent strokes.  Have regular diabetes screenings. This involves taking a blood sample to check your fasting blood sugar level.  If you are at a normal weight and have a low risk for diabetes, have this test once every three years after 61 years of age.  If you are overweight and have a high risk for diabetes, consider being tested at a younger age or more often. PREVENTING INFECTION  Hepatitis B  If you have a higher risk for hepatitis B, you should be screened for this virus. You are considered at high risk for hepatitis B if:  You were born in a country where hepatitis B is common. Ask your health care provider which countries are considered high risk.  Your parents were born in a high-risk country, and you have not been immunized against hepatitis B (hepatitis B vaccine).  You have HIV or AIDS.  You use needles to inject street drugs.  You live with someone who has hepatitis B.  You have had sex with someone who has hepatitis B.  You get hemodialysis treatment.  You take certain medicines for conditions, including cancer, organ transplantation, and autoimmune conditions. Hepatitis C  Blood testing is recommended for:  Everyone born from 1945 through 1965.  Anyone with known risk factors for hepatitis C. Sexually transmitted infections (STIs)  You should be screened for sexually transmitted infections (STIs) including gonorrhea and chlamydia if:  You are sexually active and are younger than 61 years of age.  You are older than 61 years of age and your health care provider tells you that you are at risk for this type of infection.  Your sexual activity has changed since you were last screened and you are at an increased risk for chlamydia or gonorrhea. Ask your health care provider if you are at risk.  If you do not have HIV, but are at risk, it may be recommended that you take a prescription medicine daily to prevent HIV infection. This is called  pre-exposure prophylaxis (PrEP). You are considered at risk if:  You are sexually active and do not regularly use condoms or know the HIV status of your partner(s).  You take drugs by injection.  You are sexually active with a partner who has HIV. Talk with your health care provider about whether you are at high risk of being infected with HIV. If you choose to begin PrEP, you should first be tested for   HIV. You should then be tested every 3 months for as long as you are taking PrEP.  PREGNANCY   If you are premenopausal and you may become pregnant, ask your health care provider about preconception counseling.  If you may become pregnant, take 400 to 800 micrograms (mcg) of folic acid every day.  If you want to prevent pregnancy, talk to your health care provider about birth control (contraception). OSTEOPOROSIS AND MENOPAUSE   Osteoporosis is a disease in which the bones lose minerals and strength with aging. This can result in serious bone fractures. Your risk for osteoporosis can be identified using a bone density scan.  If you are 65 years of age or older, or if you are at risk for osteoporosis and fractures, ask your health care provider if you should be screened.  Ask your health care provider whether you should take a calcium or vitamin D supplement to lower your risk for osteoporosis.  Menopause may have certain physical symptoms and risks.  Hormone replacement therapy may reduce some of these symptoms and risks. Talk to your health care provider about whether hormone replacement therapy is right for you.  HOME CARE INSTRUCTIONS   Schedule regular health, dental, and eye exams.  Stay current with your immunizations.   Do not use any tobacco products including cigarettes, chewing tobacco, or electronic cigarettes.  If you are pregnant, do not drink alcohol.  If you are breastfeeding, limit how much and how often you drink alcohol.  Limit alcohol intake to no more than 1  drink per day for nonpregnant women. One drink equals 12 ounces of beer, 5 ounces of wine, or 1 ounces of hard liquor.  Do not use street drugs.  Do not share needles.  Ask your health care provider for help if you need support or information about quitting drugs.  Tell your health care provider if you often feel depressed.  Tell your health care provider if you have ever been abused or do not feel safe at home.   This information is not intended to replace advice given to you by your health care provider. Make sure you discuss any questions you have with your health care provider.   Document Released: 06/28/2011 Document Revised: 01/03/2015 Document Reviewed: 11/14/2013 Elsevier Interactive Patient Education 2016 [employer] Inc.  

## 2016-07-01 NOTE — Progress Notes (Signed)
   Subjective:    Patient ID: Kim Hodge, female    DOB: September 25, 1956, 60 y.o.   MRN: [id number]  HPI Here for medicare wellness, no new complaints. Please see A/P for status and treatment of chronic medical problems.   Diet: renal Physical activity: sedentary Depression/mood screen: negative Hearing: intact to whispered voice Visual acuity: with deficiency, cannot afford glasses at this time, performs annual eye exam  ADLs: capable Fall risk: none Home safety: good Cognitive evaluation: intact to orientation, naming, recall and repetition EOL planning: adv directives discussed, not in place but discussed with family  I have personally reviewed and have noted 1. The patient's medical and social history - reviewed today no changes 2. Their use of alcohol, tobacco or illicit drugs 3. Their current medications and supplements 4. The patient's functional ability including ADL's, fall risks, home safety risks and hearing or visual impairment. 5. Diet and physical activities 6. Evidence for depression or mood disorders 7. Care team reviewed and updated (available in snapshot)  Review of Systems  Constitutional: Positive for fatigue. Negative for fever, chills, activity change and appetite change.  HENT: Negative for facial swelling, sinus pressure, sore throat and trouble swallowing.   Eyes: Negative.   Respiratory: Negative for cough, chest tightness, shortness of breath and wheezing.   Cardiovascular: Negative for chest pain, palpitations and leg swelling.  Gastrointestinal: Positive for diarrhea and constipation. Negative for abdominal pain and abdominal distention.       Some bowel changes since starting dialysis  Musculoskeletal: Negative.   Skin: Negative.   Neurological: Negative for dizziness, seizures, speech difficulty and weakness.  Psychiatric/Behavioral: Negative.       Objective:   Physical Exam  Constitutional: She is oriented to person, place, and time. She  appears well-developed and well-nourished.  HENT:  Head: Normocephalic and atraumatic.  Eyes: EOM are normal.  Neck: Normal range of motion.  Cardiovascular: Normal rate and regular rhythm.   Pulmonary/Chest: Effort normal and breath sounds normal. No respiratory distress. She has no wheezes.  Abdominal: Soft. Bowel sounds are normal. She exhibits no distension. There is no tenderness. There is no rebound.  Musculoskeletal: She exhibits no edema.  Lymphadenopathy:    She has no cervical adenopathy.  Neurological: She is alert and oriented to person, place, and time. Coordination normal.  Skin: Skin is warm and dry.   Filed Vitals:   07/01/16 1335  BP: 98/58  Pulse: 95  Temp: 98.3 F (36.8 C)  TempSrc: Oral  Resp: 18  Height: 5\' 3"  (1.6 m)  Weight: 176 lb (79.833 kg)  SpO2: 98%      Assessment & Plan:  Tdap given at visit

## 2016-07-01 NOTE — Assessment & Plan Note (Addendum)
Given tdap at visit, rx for shingles shot. Got hiv screening through dialysis but no records for [place] to review. Colonoscopy done this year and due for repeat in 3 years. No DEXA due to ESRD. Counseled on the need for exercise. Given 10 year screening recommendations. Counseled on smoking cessation.

## 2016-07-01 NOTE — Assessment & Plan Note (Signed)
Counseled again to quit and she is not able to try at this time. Previous attempt with >1 month cessation which increases likelihood of success in the future. Reminded about the risks and harms of smoking.

## 2016-07-01 NOTE — Progress Notes (Signed)
Pre visit review using our clinic review tool, if applicable. No additional management support is needed unless otherwise documented below in the visit note. 

## 2016-07-08 ENCOUNTER — Other Ambulatory Visit: Payer: Self-pay | Admitting: *Deleted

## 2016-07-08 MED ORDER — ALBUTEROL SULFATE HFA 108 (90 BASE) MCG/ACT IN AERS: 1.0000 | INHALATION_SPRAY | Freq: Four times a day (QID) | RESPIRATORY_TRACT | Status: AC | PRN

## 2016-07-08 NOTE — Telephone Encounter (Signed)
Rec'd call pt requesting refill on her albuterol inhaler. Verified pharmacy inform will send to [facility]...Johny Chess

## 2016-08-16 ENCOUNTER — Other Ambulatory Visit (HOSPITAL_COMMUNITY): Payer: Self-pay | Admitting: Nephrology

## 2016-08-18 ENCOUNTER — Other Ambulatory Visit (HOSPITAL_COMMUNITY): Payer: Self-pay | Admitting: Nephrology

## 2016-08-18 DIAGNOSIS — N189 Chronic kidney disease, unspecified: Secondary | ICD-10-CM

## 2016-08-18 DIAGNOSIS — C649 Malignant neoplasm of unspecified kidney, except renal pelvis: Secondary | ICD-10-CM

## 2016-08-26 ENCOUNTER — Ambulatory Visit (HOSPITAL_COMMUNITY)
Admission: RE | Admit: 2016-08-26 | Discharge: 2016-08-26 | Disposition: A | Discharge status: Home / Self Care | Payer: Medicare Other | Source: Ambulatory Visit | Attending: Nephrology | Admitting: Nephrology

## 2016-08-26 DIAGNOSIS — N189 Chronic kidney disease, unspecified: Secondary | ICD-10-CM | POA: Insufficient documentation

## 2016-08-26 DIAGNOSIS — N281 Cyst of kidney, acquired: Secondary | ICD-10-CM | POA: Diagnosis not present

## 2016-09-03 ENCOUNTER — Encounter: Payer: Self-pay | Admitting: Vascular Surgery

## 2016-09-09 ENCOUNTER — Ambulatory Visit: Payer: Medicare Other | Admitting: Vascular Surgery

## 2016-09-10 ENCOUNTER — Encounter: Payer: Self-pay | Admitting: Vascular Surgery

## 2016-09-16 ENCOUNTER — Ambulatory Visit (INDEPENDENT_AMBULATORY_CARE_PROVIDER_SITE_OTHER): Payer: Medicare Other | Admitting: Vascular Surgery

## 2016-09-16 ENCOUNTER — Encounter: Payer: Self-pay | Admitting: Vascular Surgery

## 2016-09-16 VITALS — BP 120/81 | HR 112 | Temp 97.8°F | Resp 16 | Ht 63.0 in | Wt 172.0 lb

## 2016-09-16 DIAGNOSIS — N186 End stage renal disease: Secondary | ICD-10-CM | POA: Diagnosis not present

## 2016-09-16 DIAGNOSIS — Z992 Dependence on renal dialysis: Secondary | ICD-10-CM | POA: Diagnosis not present

## 2016-09-16 NOTE — Progress Notes (Signed)
[facility] [place] HISTORY AND PHYSICAL   History of Present Illness:  Patient is a 60 y.o. year old female who presents with thinner skin area over her left upper arm graft mid section  She states they have not been sticking her graft in that area on purpose lately to protect it.  She has not had trouble with bleeding episodes until this past week.  She was in [place] and the center she visited had to hold pressure for over 2 hours to stop the bleeding.  This caused the graft to clot and they had to perform a thrombectomy.  The graft is 61 years old and other issue has ran good.  She had HD yesterday without difficulty.  She has HD M-W-F.    Past Medical History:  Diagnosis Date  . Anemia   . Chronic bronchitis   . Daily headache   . Degenerative joint disease    "right knee" (11/06/2015)  . Depression   . Diabetes mellitus    "only when I take steroids" (11/06/2015)  . ESRD (end stage renal disease) on dialysis [facility])    "[place]; off [employer]; MWF" (11/06/2015)  . Family history of adverse reaction to anesthesia    "sister took a long long time to wake up"  . Fibrosis of uterus   . GERD (gastroesophageal reflux disease)   . Hx of colonic polyps 02/25/2016  . Hyperlipidemia   . Hyperparathyroidism, secondary renal ([place])   . Hypertension   . Membranoproliferative glomerulonephritis    type 1  . NEPHROTIC SYNDROME 05/07/2010   Qualifier: Diagnosis of  By: Hollie Salk CMA, [name]    . Neuromuscular disorder (HCC)    neuropathy  . Obesity   . Pneumonia ?01/2015; 06/09/2015; 11/06/2015  . Shortness of breath dyspnea     Past Surgical History:  Procedure Laterality Date  . ARTERIOVENOUS GRAFT PLACEMENT Left 02/2010   upper arm; wrapped it around the fistula"  . AV FISTULA PLACEMENT Left 01/02/10   upper arm; "only lasted 2 months"  . COLONOSCOPY     Dr Lajoyce Corners  . ESOPHAGOGASTRODUODENOSCOPY     Dr Lajoyce Corners  . KNEE ARTHROSCOPY Right 2006  . UPPER  GASTROINTESTINAL ENDOSCOPY    . VAGINAL HYSTERECTOMY  2011     Social History Social History  Substance Use Topics  . Smoking status: Current Some Day Smoker    Packs/day: 0.33    Years: 44.00    Types: Cigarettes  . Smokeless tobacco: Never Used     Comment: 1 pk every 3 days.   . Alcohol use No    Family History Family History  Problem Relation Age of Onset  . Cancer Mother     breast  . Breast cancer Mother   . Stroke Father   . Colon cancer Neg Hx   . Colon polyps Neg Hx     Allergies  Allergies  Allergen Reactions  . Ace Inhibitors Anaphylaxis, Shortness Of Breath and Swelling    Tongue and throat swells, cannot breathe     Current Outpatient Prescriptions  Medication Sig Dispense Refill  . acetaminophen (TYLENOL) 500 MG tablet Take 1,500 mg by mouth every 8 (eight) hours as needed for moderate pain or headache. Reported on 06/08/2016    . albuterol (PROVENTIL HFA;VENTOLIN HFA) 108 (90 Base) MCG/ACT inhaler Inhale 1-2 puffs into the lungs every 6 (six) hours as needed for wheezing or shortness of breath. 1 Inhaler 1  . calcium acetate (PHOSLO) 667  MG capsule Take by mouth 3 (three) times daily with meals. Reported on 06/15/2016    . cinacalcet (SENSIPAR) 90 MG tablet Take 90 mg by mouth daily.    . famotidine (ACID REDUCER MAXIMUM STRENGTH) 20 MG tablet Take 20 mg by mouth as needed for heartburn or indigestion.    . fexofenadine (ALLEGRA) 180 MG tablet Take 180 mg by mouth daily.    Marland Kitchen gabapentin (NEURONTIN) 300 MG capsule Take 300 mg by mouth daily.    . pravastatin (PRAVACHOL) 20 MG tablet Take 20 mg by mouth at bedtime. Reported on 01/27/2016     No current facility-administered medications for this visit.     ROS:   General:  No weight loss, Fever, chills  HEENT: No recent headaches, no nasal bleeding, no visual changes, no sore throat  Neurologic: No dizziness, blackouts, seizures. No recent symptoms of stroke or mini- stroke. No recent episodes of  slurred speech, or temporary blindness.  Cardiac: No recent episodes of chest pain/pressure, no shortness of breath at rest.  No shortness of breath with exertion.  Denies history of atrial fibrillation or irregular heartbeat  Vascular: No history of rest pain in feet.  No history of claudication.  No history of non-healing ulcer, No history of DVT   Pulmonary: No home oxygen, no productive cough, no hemoptysis,  No asthma or wheezing  Musculoskeletal:  [ ]  Arthritis, [ ]  Low back pain,  [ ]  Joint pain  Hematologic:No history of hypercoagulable state.  No history of easy bleeding.  No history of anemia  Gastrointestinal: No hematochezia or melena,  No gastroesophageal reflux, no trouble swallowing  Urinary: [x ] chronic Kidney disease, [ x] on HD - [ ]  MWF or [ ]  TTHS, [ ]  Burning with urination, [ ]  Frequent urination, [ ]  Difficulty urinating;   Skin: No rashes  Psychological: No history of anxiety,  No history of depression   Physical Examination  Vitals:   09/16/16 1258  BP: 120/81  Pulse: (!) 112  Resp: 16  Temp: 97.8 F (36.6 C)  TempSrc: Oral  SpO2: 100%  Weight: 172 lb (78 kg)  Height: 5\' 3"  (1.6 m)    Body mass index is 30.47 kg/m.  General:  Alert and oriented, no acute distress HEENT: Normal Neck: No bruit or JVD Pulmonary: Clear to auscultation bilaterally Cardiac: Regular Rate and Rhythm without murmur Gastrointestinal: Soft, non-tender, non-distended, no mass, no scars Skin: Mid graft some thinning of the skin. There is no ulceration involving the skin which is not really smooth or shiny in appearance. 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  At this time she is not in any hurry to have the graft revised.  She  will have the HD tech. Stick above and below the area in question.  If she has problems or concerns and want the graft revised she will call us.  PLAN: Roxy Horseman PA-C [facility]  The patient was seen today in conjunction with Dr. Oneida Alar  History and exam details as above. Patient did have a bleeding episode recently but this does not appear necessarily related any ulceration over the access site. 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 I discussed with the patient today the  possibility of revising this. However, I do not believe that this is a huge risk for bleeding currently. The patient will follow-up with [place] on as-needed basis if she wishes to have this revised at some point future. Currently they are avoiding this stick area.  Ruta Hinds, MD [facility] [place] Office: [phone] Pager: [phone]

## 2016-09-17 ENCOUNTER — Other Ambulatory Visit: Payer: Self-pay

## 2016-10-03 ENCOUNTER — Encounter (HOSPITAL_COMMUNITY): Payer: Self-pay | Admitting: Emergency Medicine

## 2016-10-03 ENCOUNTER — Emergency Department (HOSPITAL_COMMUNITY): Payer: Medicare Other

## 2016-10-03 ENCOUNTER — Ambulatory Visit (HOSPITAL_COMMUNITY): Admit: 2016-10-03 | Payer: Medicare Other | Admitting: Cardiovascular Disease

## 2016-10-03 ENCOUNTER — Encounter (HOSPITAL_COMMUNITY): Admission: EM | Disposition: E | Payer: Self-pay | Source: Home / Self Care | Attending: Cardiovascular Disease

## 2016-10-03 ENCOUNTER — Inpatient Hospital Stay (HOSPITAL_COMMUNITY)
Admission: EM | Admit: 2016-10-03 | Discharge: 2016-10-27 | DRG: 246 | Disposition: E | Payer: Medicare Other | Attending: Cardiovascular Disease | Admitting: Cardiovascular Disease

## 2016-10-03 DIAGNOSIS — Z91018 Allergy to other foods: Secondary | ICD-10-CM

## 2016-10-03 DIAGNOSIS — Z992 Dependence on renal dialysis: Secondary | ICD-10-CM

## 2016-10-03 DIAGNOSIS — K219 Gastro-esophageal reflux disease without esophagitis: Secondary | ICD-10-CM | POA: Diagnosis present

## 2016-10-03 DIAGNOSIS — Z452 Encounter for adjustment and management of vascular access device: Secondary | ICD-10-CM

## 2016-10-03 DIAGNOSIS — I469 Cardiac arrest, cause unspecified: Secondary | ICD-10-CM

## 2016-10-03 DIAGNOSIS — E1122 Type 2 diabetes mellitus with diabetic chronic kidney disease: Secondary | ICD-10-CM | POA: Diagnosis present

## 2016-10-03 DIAGNOSIS — D62 Acute posthemorrhagic anemia: Secondary | ICD-10-CM

## 2016-10-03 DIAGNOSIS — Z90710 Acquired absence of both cervix and uterus: Secondary | ICD-10-CM

## 2016-10-03 DIAGNOSIS — F17210 Nicotine dependence, cigarettes, uncomplicated: Secondary | ICD-10-CM | POA: Diagnosis present

## 2016-10-03 DIAGNOSIS — I2121 ST elevation (STEMI) myocardial infarction involving left circumflex coronary artery: Secondary | ICD-10-CM | POA: Diagnosis not present

## 2016-10-03 DIAGNOSIS — R570 Cardiogenic shock: Secondary | ICD-10-CM | POA: Diagnosis present

## 2016-10-03 DIAGNOSIS — I213 ST elevation (STEMI) myocardial infarction of unspecified site: Secondary | ICD-10-CM | POA: Diagnosis not present

## 2016-10-03 DIAGNOSIS — I4901 Ventricular fibrillation: Secondary | ICD-10-CM | POA: Diagnosis not present

## 2016-10-03 DIAGNOSIS — J9601 Acute respiratory failure with hypoxia: Secondary | ICD-10-CM | POA: Diagnosis not present

## 2016-10-03 DIAGNOSIS — K921 Melena: Secondary | ICD-10-CM | POA: Diagnosis not present

## 2016-10-03 DIAGNOSIS — Z955 Presence of coronary angioplasty implant and graft: Secondary | ICD-10-CM

## 2016-10-03 DIAGNOSIS — I10 Essential (primary) hypertension: Secondary | ICD-10-CM | POA: Diagnosis not present

## 2016-10-03 DIAGNOSIS — I120 Hypertensive chronic kidney disease with stage 5 chronic kidney disease or end stage renal disease: Secondary | ICD-10-CM | POA: Diagnosis present

## 2016-10-03 DIAGNOSIS — N2581 Secondary hyperparathyroidism of renal origin: Secondary | ICD-10-CM | POA: Diagnosis present

## 2016-10-03 DIAGNOSIS — Z720 Tobacco use: Secondary | ICD-10-CM | POA: Diagnosis present

## 2016-10-03 DIAGNOSIS — Z9889 Other specified postprocedural states: Secondary | ICD-10-CM | POA: Diagnosis not present

## 2016-10-03 DIAGNOSIS — I4891 Unspecified atrial fibrillation: Secondary | ICD-10-CM | POA: Diagnosis not present

## 2016-10-03 DIAGNOSIS — E872 Acidosis: Secondary | ICD-10-CM | POA: Diagnosis present

## 2016-10-03 DIAGNOSIS — I2119 ST elevation (STEMI) myocardial infarction involving other coronary artery of inferior wall: Secondary | ICD-10-CM | POA: Diagnosis present

## 2016-10-03 DIAGNOSIS — Z6834 Body mass index (BMI) 34.0-34.9, adult: Secondary | ICD-10-CM

## 2016-10-03 DIAGNOSIS — Z87441 Personal history of nephrotic syndrome: Secondary | ICD-10-CM

## 2016-10-03 DIAGNOSIS — E1165 Type 2 diabetes mellitus with hyperglycemia: Secondary | ICD-10-CM | POA: Diagnosis present

## 2016-10-03 DIAGNOSIS — I2510 Atherosclerotic heart disease of native coronary artery without angina pectoris: Secondary | ICD-10-CM | POA: Diagnosis present

## 2016-10-03 DIAGNOSIS — Z79899 Other long term (current) drug therapy: Secondary | ICD-10-CM

## 2016-10-03 DIAGNOSIS — M1711 Unilateral primary osteoarthritis, right knee: Secondary | ICD-10-CM | POA: Diagnosis present

## 2016-10-03 DIAGNOSIS — T8089XA Other complications following infusion, transfusion and therapeutic injection, initial encounter: Secondary | ICD-10-CM | POA: Diagnosis not present

## 2016-10-03 DIAGNOSIS — J939 Pneumothorax, unspecified: Secondary | ICD-10-CM

## 2016-10-03 DIAGNOSIS — N186 End stage renal disease: Secondary | ICD-10-CM | POA: Diagnosis not present

## 2016-10-03 DIAGNOSIS — R571 Hypovolemic shock: Secondary | ICD-10-CM | POA: Diagnosis not present

## 2016-10-03 DIAGNOSIS — E669 Obesity, unspecified: Secondary | ICD-10-CM | POA: Diagnosis present

## 2016-10-03 DIAGNOSIS — T82838A Hemorrhage of vascular prosthetic devices, implants and grafts, initial encounter: Secondary | ICD-10-CM | POA: Diagnosis not present

## 2016-10-03 DIAGNOSIS — K683 Retroperitoneal hematoma: Secondary | ICD-10-CM

## 2016-10-03 DIAGNOSIS — D638 Anemia in other chronic diseases classified elsewhere: Secondary | ICD-10-CM | POA: Diagnosis present

## 2016-10-03 DIAGNOSIS — I97638 Postprocedural hematoma of a circulatory system organ or structure following other circulatory system procedure: Secondary | ICD-10-CM | POA: Diagnosis not present

## 2016-10-03 DIAGNOSIS — Z66 Do not resuscitate: Secondary | ICD-10-CM | POA: Diagnosis present

## 2016-10-03 DIAGNOSIS — E785 Hyperlipidemia, unspecified: Secondary | ICD-10-CM | POA: Diagnosis present

## 2016-10-03 DIAGNOSIS — G931 Anoxic brain damage, not elsewhere classified: Secondary | ICD-10-CM

## 2016-10-03 DIAGNOSIS — E118 Type 2 diabetes mellitus with unspecified complications: Secondary | ICD-10-CM

## 2016-10-03 DIAGNOSIS — Z6832 Body mass index (BMI) 32.0-32.9, adult: Secondary | ICD-10-CM

## 2016-10-03 DIAGNOSIS — R195 Other fecal abnormalities: Secondary | ICD-10-CM | POA: Diagnosis not present

## 2016-10-03 DIAGNOSIS — Z888 Allergy status to other drugs, medicaments and biological substances status: Secondary | ICD-10-CM

## 2016-10-03 DIAGNOSIS — Z9289 Personal history of other medical treatment: Secondary | ICD-10-CM

## 2016-10-03 DIAGNOSIS — N858 Other specified noninflammatory disorders of uterus: Secondary | ICD-10-CM | POA: Diagnosis present

## 2016-10-03 DIAGNOSIS — IMO0002 Reserved for concepts with insufficient information to code with codable children: Secondary | ICD-10-CM | POA: Diagnosis present

## 2016-10-03 DIAGNOSIS — Z515 Encounter for palliative care: Secondary | ICD-10-CM | POA: Diagnosis present

## 2016-10-03 DIAGNOSIS — R58 Hemorrhage, not elsewhere classified: Secondary | ICD-10-CM

## 2016-10-03 HISTORY — PX: CARDIAC CATHETERIZATION: SHX172

## 2016-10-03 LAB — CBC
HCT: 27.7 % — ABNORMAL LOW (ref 36.0–46.0)
HCT: 41.5 % (ref 36.0–46.0)
HEMOGLOBIN: 8.4 g/dL — AB (ref 12.0–15.0)
Hemoglobin: 13.4 g/dL (ref 12.0–15.0)
MCH: 32.2 pg (ref 26.0–34.0)
MCH: 31.2 pg (ref 26.0–34.0)
MCHC: 30.3 g/dL (ref 30.0–36.0)
MCHC: 32.3 g/dL (ref 30.0–36.0)
MCV: 106.1 fL — AB (ref 78.0–100.0)
MCV: 96.5 fL (ref 78.0–100.0)
PLATELETS: 188 10*3/uL (ref 150–400)
PLATELETS: 254 10*3/uL (ref 150–400)
RBC: 2.61 MIL/uL — AB (ref 3.87–5.11)
RBC: 4.3 MIL/uL (ref 3.87–5.11)
RDW: 15.2 % (ref 11.5–15.5)
RDW: 16.1 % — ABNORMAL HIGH (ref 11.5–15.5)
WBC: 11.9 10*3/uL — AB (ref 4.0–10.5)
WBC: 35.3 10*3/uL — ABNORMAL HIGH (ref 4.0–10.5)

## 2016-10-03 LAB — BASIC METABOLIC PANEL
Anion gap: 12 (ref 5–15)
Anion gap: 14 (ref 5–15)
BUN: 53 mg/dL — AB (ref 6–20)
BUN: 55 mg/dL — AB (ref 6–20)
CALCIUM: 7.1 mg/dL — AB (ref 8.9–10.3)
CALCIUM: 7.3 mg/dL — AB (ref 8.9–10.3)
CHLORIDE: 108 mmol/L (ref 101–111)
CHLORIDE: 104 mmol/L (ref 101–111)
CO2: 13 mmol/L — AB (ref 22–32)
CO2: 14 mmol/L — ABNORMAL LOW (ref 22–32)
CREATININE: 11.25 mg/dL — AB (ref 0.44–1.00)
CREATININE: 11.32 mg/dL — AB (ref 0.44–1.00)
GFR calc non Af Amer: 3 mL/min — ABNORMAL LOW (ref >60)
GFR, EST AFRICAN AMERICAN: 4 mL/min — AB (ref >60)
GFR, EST AFRICAN AMERICAN: 4 mL/min — AB (ref >60)
GFR, EST NON AFRICAN AMERICAN: 3 mL/min — AB (ref >60)
GLUCOSE: 249 mg/dL — AB (ref 65–99)
Glucose, Bld: 157 mg/dL — ABNORMAL HIGH (ref 65–99)
Potassium: 4.4 mmol/L (ref 3.5–5.1)
Potassium: 6.2 mmol/L — ABNORMAL HIGH (ref 3.5–5.1)
SODIUM: 132 mmol/L — AB (ref 135–145)
Sodium: 133 mmol/L — ABNORMAL LOW (ref 135–145)

## 2016-10-03 LAB — GLUCOSE, CAPILLARY
GLUCOSE-CAPILLARY: 234 mg/dL — AB (ref 65–99)
GLUCOSE-CAPILLARY: 205 mg/dL — AB (ref 65–99)
GLUCOSE-CAPILLARY: 215 mg/dL — AB (ref 65–99)
GLUCOSE-CAPILLARY: 199 mg/dL — AB (ref 65–99)
GLUCOSE-CAPILLARY: 129 mg/dL — AB (ref 65–99)
GLUCOSE-CAPILLARY: 135 mg/dL — AB (ref 65–99)
GLUCOSE-CAPILLARY: 148 mg/dL — AB (ref 65–99)
Glucose-Capillary: 199 mg/dL — ABNORMAL HIGH (ref 65–99)
Glucose-Capillary: 152 mg/dL — ABNORMAL HIGH (ref 65–99)
Glucose-Capillary: 126 mg/dL — ABNORMAL HIGH (ref 65–99)
Glucose-Capillary: 150 mg/dL — ABNORMAL HIGH (ref 65–99)

## 2016-10-03 LAB — POCT I-STAT, CHEM 8
BUN: 49 mg/dL — AB (ref 6–20)
BUN: 45 mg/dL — ABNORMAL HIGH (ref 6–20)
BUN: 52 mg/dL — ABNORMAL HIGH (ref 6–20)
BUN: 50 mg/dL — AB (ref 6–20)
CALCIUM ION: 1.04 mmol/L — AB (ref 1.15–1.40)
CALCIUM ION: 0.98 mmol/L — AB (ref 1.15–1.40)
CALCIUM ION: 1.03 mmol/L — AB (ref 1.15–1.40)
CHLORIDE: 107 mmol/L (ref 101–111)
CREATININE: 11.4 mg/dL — AB (ref 0.44–1.00)
CREATININE: 10.7 mg/dL — AB (ref 0.44–1.00)
Calcium, Ion: 1.03 mmol/L — ABNORMAL LOW (ref 1.15–1.40)
Chloride: 107 mmol/L (ref 101–111)
Chloride: 111 mmol/L (ref 101–111)
Chloride: 108 mmol/L (ref 101–111)
Creatinine, Ser: 11.5 mg/dL — ABNORMAL HIGH (ref 0.44–1.00)
Creatinine, Ser: 11.3 mg/dL — ABNORMAL HIGH (ref 0.44–1.00)
GLUCOSE: 239 mg/dL — AB (ref 65–99)
GLUCOSE: 200 mg/dL — AB (ref 65–99)
GLUCOSE: 188 mg/dL — AB (ref 65–99)
Glucose, Bld: 158 mg/dL — ABNORMAL HIGH (ref 65–99)
HCT: 40 % (ref 36.0–46.0)
HCT: 41 % (ref 36.0–46.0)
HEMATOCRIT: 42 % (ref 36.0–46.0)
HEMATOCRIT: 39 % (ref 36.0–46.0)
HEMOGLOBIN: 14.3 g/dL (ref 12.0–15.0)
HEMOGLOBIN: 13.6 g/dL (ref 12.0–15.0)
Hemoglobin: 13.9 g/dL (ref 12.0–15.0)
Hemoglobin: 13.3 g/dL (ref 12.0–15.0)
Potassium: 4.5 mmol/L (ref 3.5–5.1)
Potassium: 4.1 mmol/L (ref 3.5–5.1)
Potassium: 5.1 mmol/L (ref 3.5–5.1)
Potassium: 5.1 mmol/L (ref 3.5–5.1)
SODIUM: 138 mmol/L (ref 135–145)
SODIUM: 137 mmol/L (ref 135–145)
Sodium: 137 mmol/L (ref 135–145)
Sodium: 140 mmol/L (ref 135–145)
TCO2: 15 mmol/L (ref 0–100)
TCO2: 14 mmol/L (ref 0–100)
TCO2: 16 mmol/L (ref 0–100)
TCO2: 16 mmol/L (ref 0–100)

## 2016-10-03 LAB — PREPARE FRESH FROZEN PLASMA
UNIT DIVISION: 0
Unit division: 0

## 2016-10-03 LAB — POCT ACTIVATED CLOTTING TIME
ACTIVATED CLOTTING TIME: 246 s
ACTIVATED CLOTTING TIME: 230 s
ACTIVATED CLOTTING TIME: 213 s

## 2016-10-03 LAB — COMPREHENSIVE METABOLIC PANEL
ALK PHOS: 81 U/L (ref 38–126)
ALT: 33 U/L (ref 14–54)
ANION GAP: 20 — AB (ref 5–15)
AST: 47 U/L — ABNORMAL HIGH (ref 15–41)
Albumin: 1.7 g/dL — ABNORMAL LOW (ref 3.5–5.0)
BUN: 48 mg/dL — ABNORMAL HIGH (ref 6–20)
CHLORIDE: 107 mmol/L (ref 101–111)
CO2: 14 mmol/L — ABNORMAL LOW (ref 22–32)
CREATININE: 11.27 mg/dL — AB (ref 0.44–1.00)
Calcium: 7.5 mg/dL — ABNORMAL LOW (ref 8.9–10.3)
GFR, EST AFRICAN AMERICAN: 4 mL/min — AB (ref >60)
GFR, EST NON AFRICAN AMERICAN: 3 mL/min — AB (ref >60)
Glucose, Bld: 271 mg/dL — ABNORMAL HIGH (ref 65–99)
POTASSIUM: 4.5 mmol/L (ref 3.5–5.1)
SODIUM: 141 mmol/L (ref 135–145)
Total Bilirubin: 0.3 mg/dL (ref 0.3–1.2)
Total Protein: 3.5 g/dL — ABNORMAL LOW (ref 6.5–8.1)

## 2016-10-03 LAB — TROPONIN I
TROPONIN I: 0.67 ng/mL — AB (ref <0.03)
Troponin I: 3.45 ng/mL (ref <0.03)

## 2016-10-03 LAB — POCT I-STAT 3, ART BLOOD GAS (G3+)
ACID-BASE DEFICIT: 14 mmol/L — AB (ref 0.0–2.0)
BICARBONATE: 13 mmol/L — AB (ref 20.0–28.0)
O2 Saturation: 96 %
PH ART: 7.242 — AB (ref 7.350–7.450)
TCO2: 14 mmol/L (ref 0–100)
pCO2 arterial: 28.6 mmHg — ABNORMAL LOW (ref 32.0–48.0)
pO2, Arterial: 81 mmHg — ABNORMAL LOW (ref 83.0–108.0)

## 2016-10-03 LAB — PROTIME-INR
INR: 1.64
INR: 4.46 — AB
INR: 2.18
PROTHROMBIN TIME: 19.6 s — AB (ref 11.4–15.2)
Prothrombin Time: 43.7 seconds — ABNORMAL HIGH (ref 11.4–15.2)
Prothrombin Time: 24.6 seconds — ABNORMAL HIGH (ref 11.4–15.2)

## 2016-10-03 LAB — MRSA PCR SCREENING: MRSA by PCR: NEGATIVE

## 2016-10-03 LAB — I-STAT ARTERIAL BLOOD GAS, ED
ACID-BASE DEFICIT: 16 mmol/L — AB (ref 0.0–2.0)
BICARBONATE: 12.7 mmol/L — AB (ref 20.0–28.0)
O2 SAT: 99 %
PO2 ART: 170 mmHg — AB (ref 83.0–108.0)
TCO2: 14 mmol/L (ref 0–100)
pCO2 arterial: 39.9 mmHg (ref 32.0–48.0)
pH, Arterial: 7.113 — CL (ref 7.350–7.450)

## 2016-10-03 LAB — T4, FREE: FREE T4: 0.94 ng/dL (ref 0.61–1.12)

## 2016-10-03 LAB — I-STAT CG4 LACTIC ACID, ED: Lactic Acid, Venous: 5.27 mmol/L (ref 0.5–1.9)

## 2016-10-03 LAB — I-STAT CHEM 8, ED
BUN: 51 mg/dL — AB (ref 6–20)
CHLORIDE: 108 mmol/L (ref 101–111)
Calcium, Ion: 0.99 mmol/L — ABNORMAL LOW (ref 1.15–1.40)
Creatinine, Ser: 11.5 mg/dL — ABNORMAL HIGH (ref 0.44–1.00)
GLUCOSE: 252 mg/dL — AB (ref 65–99)
HCT: 25 % — ABNORMAL LOW (ref 36.0–46.0)
Hemoglobin: 8.5 g/dL — ABNORMAL LOW (ref 12.0–15.0)
POTASSIUM: 4.3 mmol/L (ref 3.5–5.1)
Sodium: 138 mmol/L (ref 135–145)
TCO2: 15 mmol/L (ref 0–100)

## 2016-10-03 LAB — HEPATIC FUNCTION PANEL
ALBUMIN: 2.3 g/dL — AB (ref 3.5–5.0)
ALT: 133 U/L — AB (ref 14–54)
AST: 152 U/L — AB (ref 15–41)
Alkaline Phosphatase: 104 U/L (ref 38–126)
Bilirubin, Direct: <0.1 mg/dL — ABNORMAL LOW (ref 0.1–0.5)
Total Bilirubin: 0.5 mg/dL (ref 0.3–1.2)
Total Protein: 4.6 g/dL — ABNORMAL LOW (ref 6.5–8.1)

## 2016-10-03 LAB — APTT
APTT: 128 s — AB (ref 24–36)
APTT: 66 s — AB (ref 24–36)

## 2016-10-03 LAB — I-STAT TROPONIN, ED: TROPONIN I, POC: 0.1 ng/mL — AB (ref 0.00–0.08)

## 2016-10-03 LAB — MAGNESIUM: MAGNESIUM: 2.4 mg/dL (ref 1.7–2.4)

## 2016-10-03 LAB — TSH: TSH: 1.655 u[IU]/mL (ref 0.350–4.500)

## 2016-10-03 LAB — PREPARE RBC (CROSSMATCH)

## 2016-10-03 SURGERY — LEFT HEART CATH AND CORONARY ANGIOGRAPHY

## 2016-10-03 MED ORDER — LIDOCAINE HCL (PF) 1 % IJ SOLN
INTRAMUSCULAR | Status: DC | PRN
  Administered 2016-10-03: 18 mL

## 2016-10-03 MED ORDER — FENTANYL BOLUS VIA INFUSION
50.0000 ug | INTRAVENOUS | Status: DC | PRN
Start: 2016-10-03 — End: 2016-10-08
  Filled 2016-10-03: qty 50

## 2016-10-03 MED ORDER — SODIUM CHLORIDE 0.9 % IV SOLN: 250.0000 mL | INTRAVENOUS | Status: DC | PRN

## 2016-10-03 MED ORDER — ASPIRIN 81 MG PO CHEW
324.0000 mg | CHEWABLE_TABLET | ORAL | Status: AC
  Administered 2016-10-03: 324 mg via ORAL

## 2016-10-03 MED ORDER — SODIUM CHLORIDE 0.9% FLUSH: 3.0000 mL | INTRAVENOUS | Status: DC | PRN

## 2016-10-03 MED ORDER — CISATRACURIUM BOLUS VIA INFUSION
0.0500 mg/kg | INTRAVENOUS | Status: DC | PRN
  Filled 2016-10-03: qty 4

## 2016-10-03 MED ORDER — IOPAMIDOL (ISOVUE-370) INJECTION 76%
INTRAVENOUS | Status: DC | PRN
  Administered 2016-10-03: 205 mL via INTRA_ARTERIAL

## 2016-10-03 MED ORDER — FENTANYL CITRATE (PF) 100 MCG/2ML IJ SOLN
INTRAMUSCULAR | Status: AC
  Filled 2016-10-03: qty 2

## 2016-10-03 MED ORDER — PRISMASOL BGK 4/2.5 32-4-2.5 MEQ/L IV SOLN
INTRAVENOUS | Status: DC
  Administered 2016-10-04 – 2016-10-07 (×4): via INTRAVENOUS_CENTRAL
  Filled 2016-10-03 (×5): qty 5000

## 2016-10-03 MED ORDER — ACETAMINOPHEN 325 MG PO TABS: 650.0000 mg | ORAL_TABLET | ORAL | Status: DC | PRN

## 2016-10-03 MED ORDER — IOPAMIDOL (ISOVUE-370) INJECTION 76%
INTRAVENOUS | Status: AC
  Filled 2016-10-03: qty 100

## 2016-10-03 MED ORDER — NOREPINEPHRINE BITARTRATE 1 MG/ML IV SOLN
0.0000 ug/min | INTRAVENOUS | Status: DC
  Administered 2016-10-03: 5 ug/min via INTRAVENOUS
  Administered 2016-10-03: 25 ug/min via INTRAVENOUS
  Administered 2016-10-04: 20 ug/min via INTRAVENOUS
  Filled 2016-10-03 (×4): qty 4

## 2016-10-03 MED ORDER — FENTANYL CITRATE (PF) 100 MCG/2ML IJ SOLN
INTRAMUSCULAR | Status: AC | PRN
  Administered 2016-10-03: 100 ug via INTRAVENOUS

## 2016-10-03 MED ORDER — CLOPIDOGREL BISULFATE 75 MG PO TABS
75.0000 mg | ORAL_TABLET | Freq: Every day | ORAL | Status: DC
  Administered 2016-10-04 – 2016-10-07 (×4): 75 mg via ORAL
  Filled 2016-10-03 (×4): qty 1

## 2016-10-03 MED ORDER — SODIUM CHLORIDE 0.9 % IV SOLN
10.0000 mL/h | Freq: Once | INTRAVENOUS | Status: AC
  Administered 2016-10-03: 10 mL/h via INTRAVENOUS

## 2016-10-03 MED ORDER — FENTANYL CITRATE (PF) 100 MCG/2ML IJ SOLN
INTRAMUSCULAR | Status: DC | PRN
  Administered 2016-10-03: 50 ug via INTRAVENOUS

## 2016-10-03 MED ORDER — ORAL CARE MOUTH RINSE
15.0000 mL | Freq: Four times a day (QID) | OROMUCOSAL | Status: DC
  Administered 2016-10-03: 15 mL via OROMUCOSAL

## 2016-10-03 MED ORDER — VERAPAMIL HCL 2.5 MG/ML IV SOLN
INTRAVENOUS | Status: AC
  Filled 2016-10-03: qty 2

## 2016-10-03 MED ORDER — SODIUM CHLORIDE 0.9 % IV SOLN: 100.0000 ug/h | INTRAVENOUS | Status: DC

## 2016-10-03 MED ORDER — SODIUM CHLORIDE 0.9 % IV SOLN
1.0000 ug/kg/min | INTRAVENOUS | Status: DC
  Administered 2016-10-03: 1.7 ug/kg/min via INTRAVENOUS
  Administered 2016-10-04: 1.5 ug/kg/min via INTRAVENOUS
  Filled 2016-10-03 (×2): qty 20

## 2016-10-03 MED ORDER — ONDANSETRON HCL 4 MG/2ML IJ SOLN
4.0000 mg | Freq: Four times a day (QID) | INTRAMUSCULAR | Status: DC | PRN
  Administered 2016-10-05: 4 mg via INTRAVENOUS
  Filled 2016-10-03: qty 2

## 2016-10-03 MED ORDER — BIVALIRUDIN BOLUS VIA INFUSION - CUPID
INTRAVENOUS | Status: DC | PRN
  Administered 2016-10-03: 58.5 mg via INTRAVENOUS

## 2016-10-03 MED ORDER — CISATRACURIUM BOLUS VIA INFUSION
0.1000 mg/kg | Freq: Once | INTRAVENOUS | Status: AC
  Administered 2016-10-03: 7.8 mg via INTRAVENOUS
  Filled 2016-10-03: qty 8

## 2016-10-03 MED ORDER — FENTANYL CITRATE (PF) 100 MCG/2ML IJ SOLN
100.0000 ug | Freq: Once | INTRAMUSCULAR | Status: AC
  Administered 2016-10-03: 100 ug via INTRAVENOUS

## 2016-10-03 MED ORDER — NITROGLYCERIN 1 MG/10 ML FOR IR/CATH LAB
INTRA_ARTERIAL | Status: AC
  Filled 2016-10-03: qty 10

## 2016-10-03 MED ORDER — ASPIRIN 81 MG PO CHEW
81.0000 mg | CHEWABLE_TABLET | Freq: Every day | ORAL | Status: DC
  Administered 2016-10-04 – 2016-10-06 (×3): 81 mg via ORAL
  Filled 2016-10-03 (×3): qty 1

## 2016-10-03 MED ORDER — INSULIN ASPART 100 UNIT/ML ~~LOC~~ SOLN
1.0000 [IU] | SUBCUTANEOUS | Status: DC
  Administered 2016-10-04 – 2016-10-05 (×4): 1 [IU] via SUBCUTANEOUS

## 2016-10-03 MED ORDER — ATORVASTATIN CALCIUM 80 MG PO TABS
80.0000 mg | ORAL_TABLET | Freq: Every day | ORAL | Status: DC
  Administered 2016-10-03 – 2016-10-07 (×5): 80 mg via ORAL
  Filled 2016-10-03 (×5): qty 1

## 2016-10-03 MED ORDER — SODIUM CHLORIDE 0.9 % IV SOLN
INTRAVENOUS | Status: DC
  Administered 2016-10-03: 12:00:00 via INTRAVENOUS
  Administered 2016-10-05: 250 mL via INTRAVENOUS
  Administered 2016-10-06 – 2016-10-07 (×2): 1000 mL via INTRAVENOUS

## 2016-10-03 MED ORDER — MIDAZOLAM HCL 2 MG/2ML IJ SOLN
INTRAMUSCULAR | Status: DC | PRN
  Administered 2016-10-03: 2 mg via INTRAVENOUS

## 2016-10-03 MED ORDER — HEPARIN (PORCINE) IN NACL 2-0.9 UNIT/ML-% IJ SOLN
INTRAMUSCULAR | Status: AC
  Filled 2016-10-03: qty 1500

## 2016-10-03 MED ORDER — ASPIRIN 300 MG RE SUPP: 300.0000 mg | RECTAL | Status: AC

## 2016-10-03 MED ORDER — LIDOCAINE HCL (PF) 1 % IJ SOLN
INTRAMUSCULAR | Status: AC
  Filled 2016-10-03: qty 30

## 2016-10-03 MED ORDER — TRANEXAMIC ACID 1000 MG/10ML IV SOLN
1000.0000 mg | INTRAVENOUS | Status: AC
  Administered 2016-10-03: 1000 mg via INTRAVENOUS
  Filled 2016-10-03: qty 10

## 2016-10-03 MED ORDER — MIDAZOLAM HCL 2 MG/2ML IJ SOLN
INTRAMUSCULAR | Status: AC
  Filled 2016-10-03: qty 2

## 2016-10-03 MED ORDER — CHLORHEXIDINE GLUCONATE 0.12% ORAL RINSE (MEDLINE KIT)
15.0000 mL | Freq: Two times a day (BID) | OROMUCOSAL | Status: DC
  Administered 2016-10-03: 15 mL via OROMUCOSAL

## 2016-10-03 MED ORDER — PANTOPRAZOLE SODIUM 40 MG PO PACK
40.0000 mg | PACK | Freq: Every day | ORAL | Status: DC
  Administered 2016-10-03 – 2016-10-07 (×5): 40 mg
  Filled 2016-10-03 (×5): qty 20

## 2016-10-03 MED ORDER — NITROGLYCERIN 1 MG/10 ML FOR IR/CATH LAB
INTRA_ARTERIAL | Status: DC | PRN
  Administered 2016-10-03: 200 ug via INTRA_ARTERIAL

## 2016-10-03 MED ORDER — ASPIRIN EC 81 MG PO TBEC: 81.0000 mg | DELAYED_RELEASE_TABLET | Freq: Every day | ORAL | Status: DC

## 2016-10-03 MED ORDER — INSULIN GLARGINE 100 UNIT/ML ~~LOC~~ SOLN
10.0000 [IU] | Freq: Every day | SUBCUTANEOUS | Status: DC
  Administered 2016-10-04 – 2016-10-07 (×4): 10 [IU] via SUBCUTANEOUS
  Filled 2016-10-03 (×5): qty 0.1

## 2016-10-03 MED ORDER — CLOPIDOGREL BISULFATE 300 MG PO TABS
ORAL_TABLET | ORAL | Status: AC
  Filled 2016-10-03: qty 2

## 2016-10-03 MED ORDER — NITROGLYCERIN 0.4 MG SL SUBL: 0.4000 mg | SUBLINGUAL_TABLET | SUBLINGUAL | Status: DC | PRN

## 2016-10-03 MED ORDER — ONDANSETRON HCL 4 MG/2ML IJ SOLN: 4.0000 mg | Freq: Four times a day (QID) | INTRAMUSCULAR | Status: DC | PRN

## 2016-10-03 MED ORDER — BIVALIRUDIN 250 MG IV SOLR
INTRAVENOUS | Status: AC
  Filled 2016-10-03: qty 250

## 2016-10-03 MED ORDER — PROPOFOL 1000 MG/100ML IV EMUL
25.0000 ug/kg/min | INTRAVENOUS | Status: DC
  Administered 2016-10-03: 25 ug/kg/min via INTRAVENOUS
  Administered 2016-10-03: 30 ug/kg/min via INTRAVENOUS
  Administered 2016-10-04: 25 ug/kg/min via INTRAVENOUS
  Filled 2016-10-03 (×3): qty 100

## 2016-10-03 MED ORDER — SODIUM CHLORIDE 0.9% FLUSH: 3.0000 mL | Freq: Two times a day (BID) | INTRAVENOUS | Status: DC

## 2016-10-03 MED ORDER — IOPAMIDOL (ISOVUE-370) INJECTION 76%
INTRAVENOUS | Status: AC
  Filled 2016-10-03: qty 125

## 2016-10-03 MED ORDER — SODIUM CHLORIDE 0.9 % IV SOLN
100.0000 ug/h | INTRAVENOUS | Status: DC
  Administered 2016-10-03: 50 ug/h via INTRAVENOUS
  Filled 2016-10-03: qty 50

## 2016-10-03 MED ORDER — SODIUM CHLORIDE 0.9 % IV SOLN: INTRAVENOUS | Status: DC

## 2016-10-03 MED ORDER — FENTANYL 2500MCG IN NS 250ML (10MCG/ML) PREMIX INFUSION
100.0000 ug/h | INTRAVENOUS | Status: DC
  Administered 2016-10-04: 100 ug/h via INTRAVENOUS
  Filled 2016-10-03: qty 250

## 2016-10-03 MED ORDER — SODIUM CHLORIDE 0.9 % IV SOLN
INTRAVENOUS | Status: DC
  Administered 2016-10-03: 1.4 [IU]/h via INTRAVENOUS
  Filled 2016-10-03: qty 2.5

## 2016-10-03 MED ORDER — ORAL CARE MOUTH RINSE
15.0000 mL | OROMUCOSAL | Status: DC
  Administered 2016-10-03 – 2016-10-07 (×37): 15 mL via OROMUCOSAL

## 2016-10-03 MED ORDER — HEPARIN SODIUM (PORCINE) 1000 UNIT/ML DIALYSIS
1000.0000 [IU] | INTRAMUSCULAR | Status: DC | PRN
Start: 2016-10-03 — End: 2016-10-08
  Administered 2016-10-05: 2800 [IU] via INTRAVENOUS_CENTRAL
  Administered 2016-10-07: 3000 [IU] via INTRAVENOUS_CENTRAL
  Filled 2016-10-03: qty 6
  Filled 2016-10-03: qty 3
  Filled 2016-10-03 (×2): qty 6
  Filled 2016-10-03: qty 3

## 2016-10-03 MED ORDER — CLOPIDOGREL BISULFATE 300 MG PO TABS
ORAL_TABLET | ORAL | Status: DC | PRN
  Administered 2016-10-03: 600 mg via ORAL

## 2016-10-03 MED ORDER — ARTIFICIAL TEARS OP OINT
1.0000 "application " | TOPICAL_OINTMENT | Freq: Three times a day (TID) | OPHTHALMIC | Status: DC
  Administered 2016-10-03 – 2016-10-04 (×5): 1 via OPHTHALMIC
  Filled 2016-10-03: qty 3.5

## 2016-10-03 MED ORDER — PRISMASOL BGK 4/2.5 32-4-2.5 MEQ/L IV SOLN
INTRAVENOUS | Status: DC
  Administered 2016-10-04 – 2016-10-07 (×10): via INTRAVENOUS_CENTRAL
  Filled 2016-10-03 (×12): qty 5000

## 2016-10-03 MED ORDER — PRISMASOL BGK 4/2.5 32-4-2.5 MEQ/L IV SOLN
INTRAVENOUS | Status: DC
  Administered 2016-10-04 – 2016-10-07 (×30): via INTRAVENOUS_CENTRAL
  Filled 2016-10-03 (×40): qty 5000

## 2016-10-03 MED ORDER — SODIUM CHLORIDE 0.9 % IV SOLN
INTRAVENOUS | Status: DC | PRN
  Administered 2016-10-03: 0.25 mg/kg/h via INTRAVENOUS

## 2016-10-03 MED ORDER — INSULIN ASPART 100 UNIT/ML ~~LOC~~ SOLN
2.0000 [IU] | SUBCUTANEOUS | Status: DC
  Administered 2016-10-03: 6 [IU] via SUBCUTANEOUS

## 2016-10-03 MED ORDER — HEPARIN (PORCINE) 2000 UNITS/L FOR CRRT
INTRAVENOUS_CENTRAL | Status: DC | PRN
  Administered 2016-10-04: 09:00:00 via INTRAVENOUS_CENTRAL
  Filled 2016-10-03 (×2): qty 1000

## 2016-10-03 MED ORDER — HEPARIN (PORCINE) IN NACL 2-0.9 UNIT/ML-% IJ SOLN
INTRAMUSCULAR | Status: DC | PRN
  Administered 2016-10-03: 1500 mL

## 2016-10-03 MED ORDER — DEXTROSE 10 % IV SOLN: INTRAVENOUS | Status: DC | PRN

## 2016-10-03 MED ORDER — CHLORHEXIDINE GLUCONATE 0.12% ORAL RINSE (MEDLINE KIT)
15.0000 mL | Freq: Two times a day (BID) | OROMUCOSAL | Status: DC
  Administered 2016-10-03 – 2016-10-07 (×9): 15 mL via OROMUCOSAL

## 2016-10-03 SURGICAL SUPPLY — 16 items
BALLN MINITREK RX 2.0X12 (BALLOONS) ×3
BALLOON MINITREK RX 2.0X12 (BALLOONS) IMPLANT
CATH INFINITI 5FR MULTPACK ANG (CATHETERS) ×2 IMPLANT
CATH VISTA GUIDE 6FR XB3.5 (CATHETERS) ×2 IMPLANT
HOVERMATT SINGLE USE (MISCELLANEOUS) ×2 IMPLANT
KIT ENCORE 26 ADVANTAGE (KITS) ×2 IMPLANT
KIT HEART LEFT (KITS) ×3 IMPLANT
PACK CARDIAC CATHETERIZATION (CUSTOM PROCEDURE TRAY) ×3 IMPLANT
SHEATH PINNACLE 6F 10CM (SHEATH) ×2 IMPLANT
STENT SYNERGY DES 2.25X12 (Permanent Stent) ×2 IMPLANT
STENT SYNERGY DES 2.25X20 (Permanent Stent) ×2 IMPLANT
SYR MEDRAD MARK V 150ML (SYRINGE) ×3 IMPLANT
TRANSDUCER W/STOPCOCK (MISCELLANEOUS) ×3 IMPLANT
TUBING CIL FLEX 10 FLL-RA (TUBING) ×3 IMPLANT
WIRE ASAHI PROWATER 180CM (WIRE) ×2 IMPLANT
WIRE EMERALD 3MM-J .035X150CM (WIRE) ×4 IMPLANT

## 2016-10-03 NOTE — ED Provider Notes (Addendum)
[place] DEPT Provider Note   CSN: [id number] Arrival date & time:        History   Chief Complaint No chief complaint on file.   HPI Kim Hodge is a 60 y.o. female.  HPI  60 year old female with a history of end-stage renal disease on dialysis presents status post cardiac arrest. History is taken from EMS as the patient is unresponsive. Patient reportedly called 911 because her fistula was bleeding and can stop. When first responders arrived there was blood in multiple rooms as it appeared the patient walked around the house while bleeding. EMS estimates "3-4 L of blood loss". Patient was briefly resuscitated with CPR given they could not find a pulse. This lasted 3 rounds of CPR. The last round they stated the patient was breathing on her own but they continued CPR. She then came in on an epinephrine drip. Fistula is no longer bleeding.  Past Medical History:  Diagnosis Date  . Anemia   . Chronic bronchitis   . Daily headache   . Degenerative joint disease    "right knee" (11/06/2015)  . Depression   . Diabetes mellitus    "only when I take steroids" (11/06/2015)  . ESRD (end stage renal disease) on dialysis [facility])    "[place]; off [employer]; MWF" (11/06/2015)  . Family history of adverse reaction to anesthesia    "sister took a long long time to wake up"  . Fibrosis of uterus   . GERD (gastroesophageal reflux disease)   . Hx of colonic polyps 02/25/2016  . Hyperlipidemia   . Hyperparathyroidism, secondary renal ([place])   . Hypertension   . Membranoproliferative glomerulonephritis    type 1  . NEPHROTIC SYNDROME 05/07/2010   Qualifier: Diagnosis of  By: Hollie Salk CMA, [name]    . Neuromuscular disorder (HCC)    neuropathy  . Obesity   . Pneumonia ?01/2015; 06/09/2015; 11/06/2015  . Shortness of breath dyspnea     Patient Active Problem List   Diagnosis Date Noted  . Routine general medical examination at a health care facility 07/01/2016  . ESRD  on dialysis ([place]) 06/15/2016  . Hx of colonic polyps 02/25/2016  . Anemia of chronic disease 06/09/2015  . Tobacco abuse 06/05/2015  . Hyperlipidemia 09/24/2010  . Essential hypertension 05/07/2010    Past Surgical History:  Procedure Laterality Date  . ARTERIOVENOUS GRAFT PLACEMENT Left 02/2010   upper arm; wrapped it around the fistula"  . AV FISTULA PLACEMENT Left 01/02/10   upper arm; "only lasted 2 months"  . COLONOSCOPY     Dr Lajoyce Corners  . ESOPHAGOGASTRODUODENOSCOPY     Dr Lajoyce Corners  . KNEE ARTHROSCOPY Right 2006  . UPPER GASTROINTESTINAL ENDOSCOPY    . VAGINAL HYSTERECTOMY  2011    OB History    No data available       Home Medications    Prior to Admission medications   Medication Sig Start Date End Date Taking? Authorizing Provider  acetaminophen (TYLENOL) 500 MG tablet Take 1,500 mg by mouth every 8 (eight) hours as needed for moderate pain or headache. Reported on 06/08/2016    Historical Provider, MD  albuterol (PROVENTIL HFA;VENTOLIN HFA) 108 (90 Base) MCG/ACT inhaler Inhale 1-2 puffs into the lungs every 6 (six) hours as needed for wheezing or shortness of breath. 07/08/16   Hoyt Koch, MD  calcium acetate (PHOSLO) 667 MG capsule Take by mouth 3 (three) times daily with meals. Reported on 06/15/2016    Historical Provider,  MD  cinacalcet (SENSIPAR) 90 MG tablet Take 90 mg by mouth daily.    Historical Provider, MD  famotidine (ACID REDUCER MAXIMUM STRENGTH) 20 MG tablet Take 20 mg by mouth as needed for heartburn or indigestion.    Historical Provider, MD  fexofenadine (ALLEGRA) 180 MG tablet Take 180 mg by mouth daily.    Historical Provider, MD  gabapentin (NEURONTIN) 300 MG capsule Take 300 mg by mouth daily.    Historical Provider, MD  pravastatin (PRAVACHOL) 20 MG tablet Take 20 mg by mouth at bedtime. Reported on 01/27/2016    Historical Provider, MD    Family History Family History  Problem Relation Age of Onset  . Cancer Mother     breast  . Breast cancer  Mother   . Stroke Father   . Colon cancer Neg Hx   . Colon polyps Neg Hx     Social History Social History  Substance Use Topics  . Smoking status: Current Some Day Smoker    Packs/day: 0.33    Years: 44.00    Types: Cigarettes  . Smokeless tobacco: Never Used     Comment: 1 pk every 3 days.   . Alcohol use No     Allergies   Ace inhibitors   Review of Systems Review of Systems  Unable to perform ROS: Patient unresponsive     Physical Exam Updated Vital Signs BP 92/71   Pulse 112   Resp 18   SpO2 100%   Physical Exam  Constitutional: She appears well-developed and well-nourished.  Covered in dried blood in all 4 extremities and trunk  HENT:  Head: Normocephalic and atraumatic.  Right Ear: External ear normal.  Left Ear: External ear normal.  Nose: Nose normal.  Eyes: Right eye exhibits no discharge. Left eye exhibits no discharge.  Pupils mid-sized bilaterally  Cardiovascular: Normal rate, regular rhythm and normal heart sounds.   Pulmonary/Chest: Effort normal and breath sounds normal.  Abdominal: Soft. There is no tenderness.  Musculoskeletal:  Left arm fistula with no thrill and with a clot without active bleeding  Neurological: She is unresponsive.  Gags during intubation and initially had some spontaneous respirations but did not react to pain.  Skin: Skin is warm and dry.  Nursing note and vitals reviewed.    ED Treatments / Results  Labs (all labs ordered are listed, but only abnormal results are displayed) Labs Reviewed  COMPREHENSIVE METABOLIC PANEL - Abnormal; Notable for the following:       Result Value   CO2 14 (*)    Glucose, Bld 271 (*)    BUN 48 (*)    Creatinine, Ser 11.27 (*)    Calcium 7.5 (*)    Total Protein 3.5 (*)    Albumin 1.7 (*)    AST 47 (*)    GFR calc non Af Amer 3 (*)    GFR calc Af Amer 4 (*)    Anion gap 20 (*)    All other components within normal limits  CBC - Abnormal; Notable for the following:    WBC  11.9 (*)    RBC 2.61 (*)    Hemoglobin 8.4 (*)    HCT 27.7 (*)    MCV 106.1 (*)    All other components within normal limits  PROTIME-INR - Abnormal; Notable for the following:    Prothrombin Time 19.6 (*)    All other components within normal limits  I-STAT CHEM 8, ED - Abnormal; Notable for the following:  BUN 51 (*)    Creatinine, Ser 11.50 (*)    Glucose, Bld 252 (*)    Calcium, Ion 0.99 (*)    Hemoglobin 8.5 (*)    HCT 25.0 (*)    All other components within normal limits  I-STAT TROPOININ, ED - Abnormal; Notable for the following:    Troponin i, poc 0.10 (*)    All other components within normal limits  I-STAT ARTERIAL BLOOD GAS, ED - Abnormal; Notable for the following:    pH, Arterial 7.113 (*)    pO2, Arterial 170.0 (*)    Bicarbonate 12.7 (*)    Acid-base deficit 16.0 (*)    All other components within normal limits  I-STAT CG4 LACTIC ACID, ED - Abnormal; Notable for the following:    Lactic Acid, Venous 5.27 (*)    All other components within normal limits  TYPE AND SCREEN  PREPARE FRESH FROZEN PLASMA  PREPARE RBC (CROSSMATCH)    EKG  EKG Interpretation  Date/Time:  Sunday October 03 2016 08:31:17 EDT Ventricular Rate:  94 PR Interval:    QRS Duration: 93 QT Interval:  343 QTC Calculation: 429 R Axis:   59 Text Interpretation:  Sinus rhythm Prolonged PR interval Inferior infarct, acute (LCx) ST elevation inferiorly and V3-V6 worse compared to earlier in the day Confirmed by Aamira Bischoff MD, Mckynzie Liwanag [phone]) on 10/26/2016 9:18:31 AM       Radiology Dg Chest Portable 1 View  Result Date: 10/11/2016 CLINICAL DATA:  Intubated. EXAM: PORTABLE CHEST 1 VIEW COMPARISON:  06/08/2016 FINDINGS: Endotracheal tube tip is 1 cm above the carina. Heart size is normal. Aortic atherosclerosis. Pulmonary vascularity is normal. Lungs are clear. No acute bone finding. IMPRESSION: Endotracheal tube tip 1 cm above the carina. Lungs clear. Vascularity normal. Aortic atherosclerosis  Electronically Signed   By: Nelson Chimes M.D.   On: 10/25/2016 08:53   Dg Abd Portable 1 View  Result Date: 10/02/2016 CLINICAL DATA:  Intubated.  Nasogastric placement. EXAM: PORTABLE ABDOMEN - 1 VIEW COMPARISON:  None. FINDINGS: Nasogastric tube enters the stomach, extends to the fundus in has its tip in the body antrum junction region. Small bowel gas pattern appears unremarkable. There is an unusual angular gas collection in or projected over the left abdomen. The nature of this is uncertain. Is there any concern regarding perforated viscus? Additional abdominal imaging suggested if abdominal pathology is suspected. Extensive chronic degenerative changes affect the lower lumbar spine and sacroiliac joints. IMPRESSION: Nasogastric tube in the stomach. Unusual angular gas shadow in or over the left abdomen. Is there concern regarding abdominal pathology? If so, CT and/or additional radiography suggested. Electronically Signed   By: Nelson Chimes M.D.   On: 10/18/2016 08:57    Procedures ARTERIAL LINE Date/Time: 10/12/2016 8:18 AM Performed by: Sherwood Gambler Authorized by: Sherwood Gambler   Consent:    Consent obtained:  Emergent situation Indications:    Indications: hemodynamic monitoring and multiple ABGs   Pre-procedure details:    Skin preparation:  2% Chlorhexidine   Preparation: Patient was prepped and draped in sterile fashion   Sedation:    Sedation type: intubated. Anesthesia (see MAR for exact dosages):    Anesthesia method:  None Procedure details:    Location:  R femoral   Needle gauge:  20 G   Placement technique:  Ultrasound guided and Seldinger   Number of attempts:  1   Transducer: waveform confirmed   Post-procedure details:    Post-procedure:  Biopatch applied, sterile dressing applied  and sutured   CMS:  Unchanged   Patient tolerance of procedure:  Tolerated well, no immediate complications  .Intubation Date/Time: 09/29/2016 8:50 AM Performed by: Sherwood Gambler Authorized by: Sherwood Gambler   Consent:    Consent obtained:  Emergent situation Pre-procedure details:    Patient status:  Unresponsive   Pretreatment medications:  None   Paralytics:  None Procedure details:    Preoxygenation: king airway.   CPR in progress: no     Intubation method:  Oral   Oral intubation technique:  Video-assisted   Laryngoscope size: #3.   Tube size (mm):  7.5   Tube type:  Cuffed   Number of attempts:  1   Cricoid pressure: yes     Tube visualized through cords: yes   Placement assessment:    ETT to lip:  24   Tube secured with:  ETT holder   Breath sounds:  Equal   Placement verification: chest rise and CXR verification     CXR findings:  ETT in proper place Post-procedure details:    Patient tolerance of procedure:  Tolerated well, no immediate complications   (including critical care time)  CRITICAL CARE Performed by: Sherwood Gambler T   Total critical care time: 40 minutes  Critical care time was exclusive of separately billable procedures and treating other patients.  Critical care was necessary to treat or prevent imminent or life-threatening deterioration.  Critical care was time spent personally by me on the following activities: development of treatment plan with patient and/or surrogate as well as nursing, discussions with consultants, evaluation of patient's response to treatment, examination of patient, obtaining history from patient or surrogate, ordering and performing treatments and interventions, ordering and review of laboratory studies, ordering and review of radiographic studies, pulse oximetry and re-evaluation of patient's condition.   Medications Ordered in ED Medications  fentaNYL (SUBLIMAZE) injection ( Intravenous Canceled Entry 10/04/2016 0800)  tranexamic acid (CYKLOKAPRON) 1,000 mg in sodium chloride 0.9 % 100 mL IVPB (0 mg Intravenous Stopped 10/22/2016 0810)  0.9 %  sodium chloride infusion (10 mL/hr Intravenous  New Bag/Given 10/11/2016 0745)     Initial Impression / Assessment and Plan / ED Course  I have reviewed the triage vital signs and the nursing notes.  Pertinent labs & imaging results that were available during my care of the patient were reviewed by me and considered in my medical decision making (see chart for details).  Clinical Course  Comment By Time  D/w Dr. Lamonte Sakai, ICU will come to admit Sherwood Gambler, MD 10/08 [phone]  EKG now looks like STEMI. D/w Dr. Gwenlyn Found, cancel code STEMI but cards will come evaluate Sherwood Gambler, MD 10/08 [phone]  Vascular consulted Sherwood Gambler, MD 10/08 [phone]    Patient was given 2 units of emergency release shortly after arrival given concern for blood loss. She was covered in blood and appeared to have severe bleeding at home. She was also given IV TXA because of her massive bleeding since it was enough to cause her to lose a pulse. Patient blood pressure did become more reasonable in the 0000000 systolic with an adequate map. Initial ECG shortly after arrival shows very mild but not STEMI criteria elevations. However later EKG was repeated and shows diffuse ST elevations concerning for myocardial injury. STEMI was called and cardiology consulted. They did take to the Cath Lab. Patient will then be admitted in critical condition. When first discussing with ICU and cardiology, given no V. fib arrest, patient was  not cooled.  Final Clinical Impressions(s) / ED Diagnoses   Final diagnoses:  Cardiac arrest ([place])  Hypovolemic shock ([place])  ST elevation myocardial infarction (STEMI), unspecified artery [facility])    New Prescriptions New Prescriptions   No medications on file     Sherwood Gambler, MD 10/25/2016 [place], MD 10/26/2016 1624

## 2016-10-03 NOTE — Consult Note (Signed)
[facility] called to aid in transporting family.  [facility] called away to actively dying patient on other floor; ED tech will transport family to cath waiting area; [facility] available of family needs further support. Gwynn Burly 12:07 PM

## 2016-10-03 NOTE — H&P (View-Only) (Signed)
Procedure Note: suture left arm AV fistula  Fistula is occluded by exam Single 5 0 prolene suture placed over ulcerated portion of fistula   Pt currently on vent  STEMI  If pt needs urgent dialysis will need temporary catheter. We will address her access when overall more stable  Call if questions  Ruta Hinds, MD [facility] [place] Office: [phone] Pager: [phone]

## 2016-10-03 NOTE — Consult Note (Signed)
Renal Service Consult Note [facility]  Kim Hodge 10/20/2016 [place] Requesting Physician:  Dr. Gwenlyn Found  Reason for Consult:  ESRD pt sp arrest/ AVF bleed, AMI and heart cath HPI: The patient is a 60 y.o. year-old with hx of HTN, tobacco use, poss COPD and ESRD on HD at [facility] MWF.  Patient called 911 this am due to bleeding AVF that she couldn't stop.  She was poorly responsive on EMS arrival, they did 3 rounds of CPR and she started to breath on her own.  Came in w epi drip and getting O neg prbc's 2 units.  She continued to bleed in ED and rec'd tranexamic acid.  Initial EKG was ok but then developed acute STEMI EKG changes.  Intubated in ED.  Dr Oneida Alar examined AVF and it was clotted, he oversewed the AVF at the bedside at the site of an ulceration.  Then she was taken for heart cath which showed 100% LCx which was stented successfully. Also had 50% L main, 75% LAD stenosis and 80% long RCA stenosis. LVEF was normal and LVEDP was low at 5.  Started on [employer] cooling protocol in the cath lab.  No is in ICU sedated and chemically paralyzed.    CXR on admission clear.  LVEDP low at cath.  Asked to see for ESRD.    Pt unresponsive at this time.    Last 3 admissions here were for resp infections (PNA x 2, bronchitis), poss underlying COPD, tobacco use.  She is not only any DM medications.  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     Past Medical History  Past Medical History:  Diagnosis Date  . Anemia   . Chronic bronchitis   . Daily headache   . Degenerative joint disease    "right knee" (11/06/2015)  . Depression   . Diabetes mellitus    "only when I take steroids" (11/06/2015)  . ESRD (end stage renal disease) on dialysis [facility])    "[place]; off [employer]; MWF" (11/06/2015)  . Family history of adverse reaction to anesthesia    "sister took a long long time to wake up"  . Fibrosis of uterus   . GERD (gastroesophageal reflux  disease)   . Hx of colonic polyps 02/25/2016  . Hyperlipidemia   . Hyperparathyroidism, secondary renal ([place])   . Hypertension   . Membranoproliferative glomerulonephritis    type 1  . NEPHROTIC SYNDROME 05/07/2010   Qualifier: Diagnosis of  By: Hollie Salk CMA, [name]    . Neuromuscular disorder (HCC)    neuropathy  . Obesity   . Pneumonia ?01/2015; 06/09/2015; 11/06/2015  . Shortness of breath dyspnea    Past Surgical History  Past Surgical History:  Procedure Laterality Date  . ARTERIOVENOUS GRAFT PLACEMENT Left 02/2010   upper arm; wrapped it around the fistula"  . AV FISTULA PLACEMENT Left 01/02/10   upper arm; "only lasted 2 months"  . COLONOSCOPY     Dr Lajoyce Corners  . ESOPHAGOGASTRODUODENOSCOPY     Dr Lajoyce Corners  . KNEE ARTHROSCOPY Right 2006  . UPPER GASTROINTESTINAL ENDOSCOPY    . VAGINAL HYSTERECTOMY  2011   Family History  Family History  Problem Relation Age of Onset  . Cancer Mother     breast  . Breast cancer Mother   . Stroke Father   . Colon cancer Neg Hx   . Colon polyps Neg Hx    Social History  reports  that she has been smoking Cigarettes.  She has a 14.52 pack-year smoking history. She has never used smokeless tobacco. She reports that she uses drugs, including Marijuana. She reports that she does not drink alcohol. Allergies  Allergies  Allergen Reactions  . Ace Inhibitors Anaphylaxis, Shortness Of Breath and Swelling    Tongue and throat swells, cannot breathe   Home medications Prior to Admission medications   Medication Sig Start Date End Date Taking? Authorizing Provider  albuterol (PROVENTIL HFA;VENTOLIN HFA) 108 (90 Base) MCG/ACT inhaler Inhale 1-2 puffs into the lungs every 6 (six) hours as needed for wheezing or shortness of breath. 07/08/16  Yes Hoyt Koch, MD  amLODipine (NORVASC) 10 MG tablet Take 10 mg by mouth daily.  08/28/16  Yes Historical Provider, MD  gabapentin (NEURONTIN) 300 MG capsule Take 300 mg by mouth at bedtime.    Yes Historical  Provider, MD  pravastatin (PRAVACHOL) 20 MG tablet Take 20 mg by mouth at bedtime. Reported on 01/27/2016   Yes Historical Provider, MD  acetaminophen (TYLENOL) 500 MG tablet Take 1,500 mg by mouth every 8 (eight) hours as needed for moderate pain or headache. Reported on 06/08/2016    Historical Provider, MD  calcium acetate (PHOSLO) 667 MG capsule Take by mouth 3 (three) times daily with meals. Reported on 06/15/2016    Historical Provider, MD  cinacalcet (SENSIPAR) 90 MG tablet Take 90 mg by mouth daily.    Historical Provider, MD  famotidine (ACID REDUCER MAXIMUM STRENGTH) 20 MG tablet Take 20 mg by mouth as needed for heartburn or indigestion.    Historical Provider, MD  fexofenadine (ALLEGRA) 180 MG tablet Take 180 mg by mouth daily.    Historical Provider, MD   Liver Function Tests  Recent Labs Lab 09/30/2016 0742 10/11/2016 0952  AST 47* 152*  ALT 33 133*  ALKPHOS 81 104  BILITOT 0.3 0.5  PROT 3.5* 4.6*  ALBUMIN 1.7* 2.3*   No results for input(s): LIPASE, AMYLASE in the last 168 hours. CBC  Recent Labs Lab 10/18/2016 0742  10/05/2016 1219 10/07/2016 1241 10/20/2016 1350 10/04/2016 1546  WBC 11.9*  --  35.3*  --   --   --   HGB 8.4*  < > 13.4 14.3 13.6 13.9  HCT 27.7*  < > 41.5 42.0 40.0 41.0  MCV 106.1*  --  96.5  --   --   --   PLT 188  --  254  --   --   --   < > = values in this interval not displayed. Basic Metabolic Panel  Recent Labs Lab 10/04/2016 0742 10/07/2016 0753 10/09/2016 1235 09/27/2016 1241 10/18/2016 1350 09/26/2016 1546  NA 141 138 133* 137 140 138  K 4.5 4.3 4.4 4.5 4.1 5.1  CL 107 108 108 107 111 107  CO2 14*  --  13*  --   --   --   GLUCOSE 271* 252* 249* 239* 200* 188*  BUN 48* 51* 53* 49* 45* 52*  CREATININE 11.27* 11.50* 11.25* 11.40* 10.70* 11.50*  CALCIUM 7.5*  --  7.1*  --   --   --    Iron/TIBC/Ferritin/ %Sat    Component Value Date/Time   IRON 17 (L) 07/30/2010 1639   TIBC 208 (L) 07/30/2010 1639   FERRITIN 432 (H) 07/30/2010 1639   IRONPCTSAT 8  (L) 07/30/2010 1639    Vitals:   10/14/2016 1400 10/16/2016 1500 10/04/2016 1600 10/24/2016 1700  BP:      Pulse:  81 69  Resp: (!) 24 (!) 24 (!) 24 (!) 24  Temp: (!) 90.5 F (32.5 C) (!) 90.9 F (32.7 C) (!) 91 F (32.8 C) (!) 91.8 F (33.2 C)  TempSrc:    Core (Comment)  SpO2:   100% 100%  Weight:       Exam Gen intubated, sedated, on the vent , unresponsive No rash, cyanosis or gangrene Sclera anicteric, throat w ETT  No jvd or bruits Chest clear bilat ant and lat RRR no MRG Abd soft ntnd no mass or ascites +bs obese no hsm GU  defer MS no joint effusions or deformity Ext no sig LE or UE edema / no wounds or ulcers Neuro is sedated on vent LUA AVF no bruit / thrill  Home meds > proventil, Sensipar, Phoslo, famotidine, Allegra, Neurontin, Pravachol  Dialysis: MWF [facility]   3h 42min   79kg   2/2.25 bath  P2   Hep 4000  LUA AVG Hect 2 ug tiw No ESA   Assessment: 1. Cardiac arrest - on cooling protocol 2. AVG bleed - AVG clotted on arrival and ulceration oversewn by VVS 3. STEMI - sp heart cath w PCI to 100% LCx, also mod-severe LAD and RCA disease, LV normal 4. Vol - low LVEDP at cath, CXR clear, no edema; under dry wt 1 kg 5. Hypotension - on levophed 20 ug/min 6. ESRD on HD , usual HD MWF    Plan - no indication for HD now.  Will likely need CRRT if BP's remain low.  No access right now, needs temp HD cath. Have d/w CCM.   Will follow.   Kelly Splinter MD [employer] pager [phone]   10/21/2016, 5:53 PM

## 2016-10-03 NOTE — Code Documentation (Signed)
First unit O negative complete, second unit O negative emergency release being infused per verbal order from [facility].

## 2016-10-03 NOTE — Progress Notes (Signed)
EKG CRITICAL VALUE     12 lead EKG performed.  Critical value noted.  Etta Quill, RN notified.   Markese Bloxham, CCT 10/05/2016 1:45 PM

## 2016-10-03 NOTE — Procedures (Signed)
Procedure Note: suture left arm AV fistula  Fistula is occluded by exam Single 5 0 prolene suture placed over ulcerated portion of fistula   Pt currently on vent  STEMI  If pt needs urgent dialysis will need temporary catheter. We will address her access when overall more stable  Call if questions  Ruta Hinds, MD [facility] [place] Office: [phone] Pager: [phone]

## 2016-10-03 NOTE — Consult Note (Signed)
Reason for Consult: STEMI   Referring Physician: Dr. Regenia Skeeter    PCP:  Hoyt Koch, MD  Primary Cardiologist:NEW  Kim Hodge is an 60 y.o. female.    Chief Complaint: arrest at home after bleed from fistula    HPI: 7 yof with hx ESRD on dialysis with DM, hx tobacco use.  She called EMS with bleeding from fistula, when EMS arrived from RN she needed CPR and then she did breathe on her own.  No V fib noted.  Her bed had 2-3 L of blood per RN.   Here she continued to bleed from fistula and tranexamic acid.  Initial EKG without ST elevation only depression second EKG with ST elevation inf. And ant. lat. Leads.  Currently intubated, Dr. Oneida Alar following fistula and it has clotted.  Pt not responding did have cough with intubation.  BP stable and SR stable.  Not responding and fentyl was 1-2 hours ago.           nuc 2011 with possible decreased activity in inf. Wall but could not rule out attenuation  Echo 2011 mild LVH, EF 55-60% G1DD.    Dr. Sallyanne Kuster has seen plan to send for urgent cath for NSTEMI.  MD discussed with critical not candidate for hypothermic protocol.   Past Medical History:  Diagnosis Date  . Anemia   . Chronic bronchitis   . Daily headache   . Degenerative joint disease    "right knee" (11/06/2015)  . Depression   . Diabetes mellitus    "only when I take steroids" (11/06/2015)  . ESRD (end stage renal disease) on dialysis [facility])    "[place]; off [employer]; MWF" (11/06/2015)  . Family history of adverse reaction to anesthesia    "sister took a long long time to wake up"  . Fibrosis of uterus   . GERD (gastroesophageal reflux disease)   . Hx of colonic polyps 02/25/2016  . Hyperlipidemia   . Hyperparathyroidism, secondary renal ([place])   . Hypertension   . Membranoproliferative glomerulonephritis    type 1  . NEPHROTIC SYNDROME 05/07/2010   Qualifier: Diagnosis of  By: Hollie Salk CMA, [name]    . Neuromuscular disorder (HCC)      neuropathy  . Obesity   . Pneumonia ?01/2015; 06/09/2015; 11/06/2015  . Shortness of breath dyspnea     Past Surgical History:  Procedure Laterality Date  . ARTERIOVENOUS GRAFT PLACEMENT Left 02/2010   upper arm; wrapped it around the fistula"  . AV FISTULA PLACEMENT Left 01/02/10   upper arm; "only lasted 2 months"  . COLONOSCOPY     Dr Lajoyce Corners  . ESOPHAGOGASTRODUODENOSCOPY     Dr Lajoyce Corners  . KNEE ARTHROSCOPY Right 2006  . UPPER GASTROINTESTINAL ENDOSCOPY    . VAGINAL HYSTERECTOMY  2011    Family History  Problem Relation Age of Onset  . Cancer Mother     breast  . Breast cancer Mother   . Stroke Father   . Colon cancer Neg Hx   . Colon polyps Neg Hx    Social History:  reports that she has been smoking Cigarettes.  She has a 14.52 pack-year smoking history. She has never used smokeless tobacco. She reports that she uses drugs, including Marijuana. She reports that she does not drink alcohol.  Allergies:  Allergies  Allergen Reactions  . Ace Inhibitors Anaphylaxis, Shortness Of Breath and Swelling    Tongue and throat swells, cannot breathe  OUTPATIENT MEDICATIONS: No current facility-administered medications on file prior to encounter.    Current Outpatient Prescriptions on File Prior to Encounter  Medication Sig Dispense Refill  . acetaminophen (TYLENOL) 500 MG tablet Take 1,500 mg by mouth every 8 (eight) hours as needed for moderate pain or headache. Reported on 06/08/2016    . albuterol (PROVENTIL HFA;VENTOLIN HFA) 108 (90 Base) MCG/ACT inhaler Inhale 1-2 puffs into the lungs every 6 (six) hours as needed for wheezing or shortness of breath. 1 Inhaler 1  . calcium acetate (PHOSLO) 667 MG capsule Take by mouth 3 (three) times daily with meals. Reported on 06/15/2016    . cinacalcet (SENSIPAR) 90 MG tablet Take 90 mg by mouth daily.    . famotidine (ACID REDUCER MAXIMUM STRENGTH) 20 MG tablet Take 20 mg by mouth as needed for heartburn or indigestion.    . fexofenadine  (ALLEGRA) 180 MG tablet Take 180 mg by mouth daily.    Marland Kitchen gabapentin (NEURONTIN) 300 MG capsule Take 300 mg by mouth daily.    . pravastatin (PRAVACHOL) 20 MG tablet Take 20 mg by mouth at bedtime. Reported on 01/27/2016       Results for orders placed or performed during the hospital encounter of 10/10/2016 (from the past 48 hour(s))  Prepare fresh frozen plasma     Status: None (Preliminary result)   Collection Time: 09/27/2016  7:31 AM  Result Value Ref Range   Unit Number [id number]    Blood Component Type LIQ PLASMA    Unit division 00    Status of Unit ISSUED    Unit tag comment VERBAL ORDERS PER DR GOLDSTON    Transfusion Status OK TO TRANSFUSE    Unit Number [id number]    Blood Component Type THAWED PLASMA    Unit division 00    Status of Unit ISSUED    Unit tag comment VERBAL ORDERS PER DR GOLDSTON    Transfusion Status OK TO TRANSFUSE   Type and screen     Status: None (Preliminary result)   Collection Time: 10/07/2016  7:42 AM  Result Value Ref Range   ABO/RH(D) O POS    Antibody Screen NEG    Sample Expiration 10/06/2016    Unit Number [id number]    Blood Component Type RBC LR PHER1    Unit division 00    Status of Unit ISSUED    Unit tag comment VERBAL ORDERS PER DR GOLDSTON    Transfusion Status OK TO TRANSFUSE    Crossmatch Result COMPATIBLE    Unit Number [id number]    Blood Component Type RED CELLS,LR    Unit division 00    Status of Unit ISSUED    Unit tag comment VERBAL ORDERS PER DR GOLDSTON    Transfusion Status OK TO TRANSFUSE    Crossmatch Result COMPATIBLE    Unit Number [id number]    Blood Component Type RED CELLS,LR    Unit division 00    Status of Unit ALLOCATED    Transfusion Status OK TO TRANSFUSE    Crossmatch Result Compatible    Unit Number [id number]    Blood Component Type RED CELLS,LR    Unit division 00    Status of Unit ALLOCATED    Transfusion Status OK TO TRANSFUSE    Crossmatch Result Compatible    Unit Number  [id number]    Blood Component Type RED CELLS,LR    Unit division 00    Status of Unit ALLOCATED    Transfusion Status OK TO  TRANSFUSE    Crossmatch Result Compatible    Unit Number [id number]    Blood Component Type RED CELLS,LR    Unit division 00    Status of Unit ALLOCATED    Transfusion Status OK TO TRANSFUSE    Crossmatch Result Compatible   Comprehensive metabolic panel     Status: Abnormal   Collection Time: 10/17/2016  7:42 AM  Result Value Ref Range   Sodium 141 135 - 145 mmol/L   Potassium 4.5 3.5 - 5.1 mmol/L   Chloride 107 101 - 111 mmol/L   CO2 14 (L) 22 - 32 mmol/L   Glucose, Bld 271 (H) 65 - 99 mg/dL   BUN 48 (H) 6 - 20 mg/dL   Creatinine, Ser 11.27 (H) 0.44 - 1.00 mg/dL   Calcium 7.5 (L) 8.9 - 10.3 mg/dL   Total Protein 3.5 (L) 6.5 - 8.1 g/dL   Albumin 1.7 (L) 3.5 - 5.0 g/dL   AST 47 (H) 15 - 41 U/L   ALT 33 14 - 54 U/L   Alkaline Phosphatase 81 38 - 126 U/L   Total Bilirubin 0.3 0.3 - 1.2 mg/dL   GFR calc non Af Amer 3 (L) >60 mL/min   GFR calc Af Amer 4 (L) >60 mL/min    Comment: (NOTE) The eGFR has been calculated using the CKD EPI equation. This calculation has not been validated in all clinical situations. eGFR's persistently <60 mL/min signify possible Chronic Kidney Disease.    Anion gap 20 (H) 5 - 15  CBC     Status: Abnormal   Collection Time: 10/24/2016  7:42 AM  Result Value Ref Range   WBC 11.9 (H) 4.0 - 10.5 K/uL   RBC 2.61 (L) 3.87 - 5.11 MIL/uL   Hemoglobin 8.4 (L) 12.0 - 15.0 g/dL   HCT 27.7 (L) 36.0 - 46.0 %   MCV 106.1 (H) 78.0 - 100.0 fL   MCH 32.2 26.0 - 34.0 pg   MCHC 30.3 30.0 - 36.0 g/dL   RDW 15.2 11.5 - 15.5 %   Platelets 188 150 - 400 K/uL  Protime-INR     Status: Abnormal   Collection Time: 10/21/2016  7:42 AM  Result Value Ref Range   Prothrombin Time 19.6 (H) 11.4 - 15.2 seconds   INR 1.64   I-Stat Troponin, ED - 0, 3, 6 hours (not at [facility])     Status: Abnormal   Collection Time: 10/13/2016  7:51 AM  Result Value Ref  Range   Troponin i, poc 0.10 (HH) 0.00 - 0.08 ng/mL   Comment NOTIFIED PHYSICIAN    Comment 3            Comment: Due to the release kinetics of cTnI, a negative result within the first hours of the onset of symptoms does not rule out myocardial infarction with certainty. If myocardial infarction is still suspected, repeat the test at appropriate intervals.   I-Stat Chem 8, ED     Status: Abnormal   Collection Time: 09/27/2016  7:53 AM  Result Value Ref Range   Sodium 138 135 - 145 mmol/L   Potassium 4.3 3.5 - 5.1 mmol/L   Chloride 108 101 - 111 mmol/L   BUN 51 (H) 6 - 20 mg/dL   Creatinine, Ser 11.50 (H) 0.44 - 1.00 mg/dL   Glucose, Bld 252 (H) 65 - 99 mg/dL   Calcium, Ion 0.99 (L) 1.15 - 1.40 mmol/L   TCO2 15 0 - 100 mmol/L   Hemoglobin 8.5 (  L) 12.0 - 15.0 g/dL   HCT 25.0 (L) 36.0 - 46.0 %  Prepare RBC     Status: None   Collection Time: 10/20/2016  8:08 AM  Result Value Ref Range   Order Confirmation ORDER PROCESSED BY BLOOD BANK   I-Stat arterial blood gas, ED     Status: Abnormal   Collection Time: 09/26/2016  8:50 AM  Result Value Ref Range   pH, Arterial 7.113 (LL) 7.350 - 7.450   pCO2 arterial 39.9 32.0 - 48.0 mmHg   pO2, Arterial 170.0 (H) 83.0 - 108.0 mmHg   Bicarbonate 12.7 (L) 20.0 - 28.0 mmol/L   TCO2 14 0 - 100 mmol/L   O2 Saturation 99.0 %   Acid-base deficit 16.0 (H) 0.0 - 2.0 mmol/L   Patient temperature HIDE    Collection site ARTERIAL LINE    Drawn by Operator    Sample type ARTERIAL    Comment NOTIFIED PHYSICIAN   I-Stat CG4 Lactic Acid, ED     Status: Abnormal   Collection Time: 10/06/2016  8:56 AM  Result Value Ref Range   Lactic Acid, Venous 5.27 (HH) 0.5 - 1.9 mmol/L   Comment NOTIFIED PHYSICIAN    Dg Chest Portable 1 View  Result Date: 10/05/2016 CLINICAL DATA:  Intubated. EXAM: PORTABLE CHEST 1 VIEW COMPARISON:  06/08/2016 FINDINGS: Endotracheal tube tip is 1 cm above the carina. Heart size is normal. Aortic atherosclerosis. Pulmonary vascularity  is normal. Lungs are clear. No acute bone finding. IMPRESSION: Endotracheal tube tip 1 cm above the carina. Lungs clear. Vascularity normal. Aortic atherosclerosis Electronically Signed   By: Nelson Chimes M.D.   On: 10/19/2016 08:53   Dg Abd Portable 1 View  Result Date: 10/25/2016 CLINICAL DATA:  Intubated.  Nasogastric placement. EXAM: PORTABLE ABDOMEN - 1 VIEW COMPARISON:  None. FINDINGS: Nasogastric tube enters the stomach, extends to the fundus in has its tip in the body antrum junction region. Small bowel gas pattern appears unremarkable. There is an unusual angular gas collection in or projected over the left abdomen. The nature of this is uncertain. Is there any concern regarding perforated viscus? Additional abdominal imaging suggested if abdominal pathology is suspected. Extensive chronic degenerative changes affect the lower lumbar spine and sacroiliac joints. IMPRESSION: Nasogastric tube in the stomach. Unusual angular gas shadow in or over the left abdomen. Is there concern regarding abdominal pathology? If so, CT and/or additional radiography suggested. Electronically Signed   By: Nelson Chimes M.D.   On: 10/07/2016 08:57    ROS: unable to obtain pt unresponsive   Blood pressure 92/71, pulse 112, resp. rate 18, SpO2 100 %.  Wt Readings from Last 3 Encounters:  09/16/16 172 lb (78 kg)  07/01/16 176 lb (79.8 kg)  06/15/16 171 lb (77.6 kg)    PE: General:unresponsive, Intubated  Skin:Warm and dry, brisk capillary refill HEENT:normocephalic, sclera clear, mucus membranes moist Neck:supple, no JVD, no bruits  Heart:S1S2 RRR without murmur, gallup, rub or click Lungs:clear, ant. without rales, rhonchi, or wheezes VZD:GLOVF,IEPP, non tender, + BS, do not palpate liver spleen or masses Ext:no lower ext edema, 1+ pedal pulses, 2+ radial pulses Neuro:Unresponsive    Assessment/Plan Principal Problem:   Cardiac arrest (HCC) Active Problems:   Acute blood loss anemia   STEMI (ST  elevation myocardial infarction) ([place])   Hyperlipidemia   Essential hypertension   Anemia of chronic disease   ESRD on dialysis [facility])  To cath lab, will notify renal of dialysis - though currently no access.  Continue meds IV. See Dr. Victorino December note.  Cath lab was delayed to control bleeding and stabilize pt.  Dr. Oneida Alar to follow fistula, CCM to see for pulm.   Abnormal abd XRAY may need CT abd once stable.   No heparin with acute bleed hgb 8.5  Troponin 0.10  Cecilie Kicks  Nurse Practitioner Certified [place] Pager [phone] or after 5pm or weekends call [phone] 10/02/2016, 9:44 AM    I have seen and examined the patient along with Cecilie Kicks , NP.  I have reviewed the chart, notes and new data.  I agree with NP's note.  Key new complaints: unresponsive, mechanically ventilated Key examination changes: no longer hypotensive, not on pressors. Bleeding has stopped and AV fistula has been sutured. Unable to awake. Key new findings / data: NSR, still has ST elevation after volume resuscitation  PLAN: Since she was able to call 911 herself, suspect downtime was short. Hypovolemic shock was cause of "arrest", no VT/VF documented. Metabolic (lactic) acidosis due to circulatory collapse. Initial ECG on arrival did not show ST elevation, albeit plagued by baseline artifact. ST elevation occurred after arrival, unclear if before or after tranexamic acid was administered. Would hope for recovery of neurological function, despite poor status right now. Recommend urgent cardiac cath and revascularization as appropriate. Prognosis is severely guarded, regardless of cath findings   Sanda Klein, MD, [place] [phone] 10/20/2016, 10:24 AM

## 2016-10-03 NOTE — H&P (Signed)
PULMONARY / CRITICAL CARE MEDICINE   Name: Kim Hodge MRN: [id number] DOB: 1955/12/30    ADMISSION DATE:  09/29/2016  REFERRING MD:  Dr Regenia Skeeter, ED  CHIEF COMPLAINT:  PEA arrest  HISTORY OF PRESENT ILLNESS:   59 yo woman, hx tobacco, ESRD on HD (MWF), DM. Has been seen for thinning above her LUE graft, some difficulty getting hemostasis after HD, thrombectomy after it clotted in mid September. She called EMS am 10/8 with acute heavy bleeding from the graft site. On EMS arrival she was unresponsive with estimated 2L blood in the bed. Received CPR x estimated 10 minutes with [facility]. Intubated in the ED, reportedly had a cough and gag but no other significant neuro responses at that time. Initial Hgb 8.4 > 8.5 down from baseline 10.  She received tranexamic acid in the ED, was seen by Dr Oneida Alar w vascular who over-sewed an ulcerated area over the graft site. Noted that the graft is now clotted, not functional. Subsequent ECG showed evolving ST elevations and cath lab was recommended. She is now in the cath lab on MV, sedation initiated.   PAST MEDICAL HISTORY :  She  has a past medical history of Anemia; Chronic bronchitis; Daily headache; Degenerative joint disease; Depression; Diabetes mellitus; ESRD (end stage renal disease) on dialysis ([place]); Family history of adverse reaction to anesthesia; Fibrosis of uterus; GERD (gastroesophageal reflux disease); colonic polyps (02/25/2016); Hyperlipidemia; Hyperparathyroidism, secondary renal ([place]); Hypertension; Membranoproliferative glomerulonephritis; NEPHROTIC SYNDROME (05/07/2010); Neuromuscular disorder ([place]); Obesity; Pneumonia (?01/2015; 06/09/2015; 11/06/2015); and Shortness of breath dyspnea.  PAST SURGICAL HISTORY: She  has a past surgical history that includes AV fistula placement (Left, 01/02/10); Knee arthroscopy (Right, 2006); Esophagogastroduodenoscopy; Colonoscopy; Vaginal hysterectomy (2011); Arteriovenous graft placement (Left, 02/2010); and Upper  gastrointestinal endoscopy.  Allergies  Allergen Reactions  . Ace Inhibitors Anaphylaxis, Shortness Of Breath and Swelling    Tongue and throat swells, cannot breathe    No current facility-administered medications on file prior to encounter.    Current Outpatient Prescriptions on File Prior to Encounter  Medication Sig  . acetaminophen (TYLENOL) 500 MG tablet Take 1,500 mg by mouth every 8 (eight) hours as needed for moderate pain or headache. Reported on 06/08/2016  . albuterol (PROVENTIL HFA;VENTOLIN HFA) 108 (90 Base) MCG/ACT inhaler Inhale 1-2 puffs into the lungs every 6 (six) hours as needed for wheezing or shortness of breath.  . calcium acetate (PHOSLO) 667 MG capsule Take by mouth 3 (three) times daily with meals. Reported on 06/15/2016  . cinacalcet (SENSIPAR) 90 MG tablet Take 90 mg by mouth daily.  . famotidine (ACID REDUCER MAXIMUM STRENGTH) 20 MG tablet Take 20 mg by mouth as needed for heartburn or indigestion.  . fexofenadine (ALLEGRA) 180 MG tablet Take 180 mg by mouth daily.  Marland Kitchen gabapentin (NEURONTIN) 300 MG capsule Take 300 mg by mouth daily.  . pravastatin (PRAVACHOL) 20 MG tablet Take 20 mg by mouth at bedtime. Reported on 01/27/2016    FAMILY HISTORY:  Her indicated that her mother is deceased. She indicated that her father is deceased.    SOCIAL HISTORY: She  reports that she has been smoking Cigarettes.  She has a 14.52 pack-year smoking history. She has never used smokeless tobacco. She reports that she uses drugs, including Marijuana. She reports that she does not drink alcohol.  REVIEW OF SYSTEMS:   Unable to obtain  SUBJECTIVE:   VITAL SIGNS: BP 132/86   Pulse 90   Temp (!) 95.1 F (35.1 C) (Rectal)  Resp 18   SpO2 100%   HEMODYNAMICS:    VENTILATOR SETTINGS: Vent Mode: PRVC FiO2 (%):  [40 %-60 %] 40 % Set Rate:  [14 bmp-24 bmp] 24 bmp Vt Set:  [420 mL-500 mL] 420 mL PEEP:  [5 cmH20] 5 cmH20 Plateau Pressure:  [19 cmH20] 19 cmH20  INTAKE  / OUTPUT: No intake/output data recorded.  PHYSICAL EXAMINATION: General:  Ill appearing woman, sedated and intubated Neuro:  Paralyzed, pupils small but react HEENT:  ETT in place,  Cardiovascular:  Regular, tachy, no M Lungs:  Distant but clear Abdomen:  Soft and tympanitic, + BS Musculoskeletal:  No deformities, no edema Skin:  No rash  LABS:  BMET  Recent Labs Lab 09/26/2016 0742 10/22/2016 0753  NA 141 138  K 4.5 4.3  CL 107 108  CO2 14*  --   BUN 48* 51*  CREATININE 11.27* 11.50*  GLUCOSE 271* 252*    Electrolytes  Recent Labs Lab 10/26/2016 0742  CALCIUM 7.5*    CBC  Recent Labs Lab 10/13/2016 0742 10/19/2016 0753  WBC 11.9*  --   HGB 8.4* 8.5*  HCT 27.7* 25.0*  PLT 188  --     Coag's  Recent Labs Lab 10/21/2016 0742  INR 1.64    Sepsis Markers  Recent Labs Lab 10/02/2016 0856  LATICACIDVEN 5.27*    ABG  Recent Labs Lab 10/15/2016 0850  PHART 7.113*  PCO2ART 39.9  PO2ART 170.0*    Liver Enzymes  Recent Labs Lab 10/07/2016 0742  AST 47*  ALT 33  ALKPHOS 81  BILITOT 0.3  ALBUMIN 1.7*    Cardiac Enzymes No results for input(s): TROPONINI, PROBNP in the last 168 hours.  Glucose No results for input(s): GLUCAP in the last 168 hours.  Imaging Ct Head Wo Contrast  Result Date: 10/06/2016 CLINICAL DATA:  60 year old female with a history of altered mental status. EXAM: CT HEAD WITHOUT CONTRAST TECHNIQUE: Contiguous axial images were obtained from the base of the skull through the vertex without intravenous contrast. COMPARISON:  CT 10/01/2014 FINDINGS: Brain: No acute intracranial hemorrhage. No midline shift or mass effect. Gray-white differentiation maintained. Unremarkable appearance of the ventricular system. Vascular: Calcifications of the intracranial vasculature. Skull: No acute fracture.  No aggressive bone lesion identified. Sinuses/Orbits: Air-fluid level of the left maxillary sinus. Other: None IMPRESSION: No CT evidence of  acute intracranial abnormality. These results were discussed by telephone at the time of interpretation on 10/13/2016 at 10:00 am to Dr. Gwenlyn Found, who verbally acknowledged these results. Air-fluid level of the left maxillary sinus, potentially sinusitis. Signed, Dulcy Fanny. Earleen Newport, DO [facility] Electronically Signed   By: Corrie Mckusick D.O.   On: 09/27/2016 10:00   Dg Chest Portable 1 View  Result Date: 09/30/2016 CLINICAL DATA:  Intubated. EXAM: PORTABLE CHEST 1 VIEW COMPARISON:  06/08/2016 FINDINGS: Endotracheal tube tip is 1 cm above the carina. Heart size is normal. Aortic atherosclerosis. Pulmonary vascularity is normal. Lungs are clear. No acute bone finding. IMPRESSION: Endotracheal tube tip 1 cm above the carina. Lungs clear. Vascularity normal. Aortic atherosclerosis Electronically Signed   By: Nelson Chimes M.D.   On: 10/07/2016 08:53   Dg Abd Portable 1 View  Result Date: 10/22/2016 CLINICAL DATA:  Intubated.  Nasogastric placement. EXAM: PORTABLE ABDOMEN - 1 VIEW COMPARISON:  None. FINDINGS: Nasogastric tube enters the stomach, extends to the fundus in has its tip in the body antrum junction region. Small bowel gas pattern appears unremarkable. There is  an unusual angular gas collection in or projected over the left abdomen. The nature of this is uncertain. Is there any concern regarding perforated viscus? Additional abdominal imaging suggested if abdominal pathology is suspected. Extensive chronic degenerative changes affect the lower lumbar spine and sacroiliac joints. IMPRESSION: Nasogastric tube in the stomach. Unusual angular gas shadow in or over the left abdomen. Is there concern regarding abdominal pathology? If so, CT and/or additional radiography suggested. Electronically Signed   By: Nelson Chimes M.D.   On: 10/21/2016 08:57     STUDIES:  Head Ct 10/8 >> no acute bleed or CVA  CULTURES:  ANTIBIOTICS:  SIGNIFICANT  EVENTS: Cardiac arrest after AV graft bleed 10/8 STEMI 10/8 > to cath lab >   LINES/TUBES: LUE AV graft x several years ETT 10/8 >>   DISCUSSION:   ASSESSMENT / PLAN:  PULMONARY A: Acute respiratory failure with hypoxemia, due to cardiac arrest P:   PRVC 8cc/kg VAP prevention orders   CARDIOVASCULAR A:  PEA arrest, presumed hemorrhagic due to acute blood loss anemia STEMI due either to arrest / hypotension + possible contribution tranexamic acid Baseline HTN, hyperlipidemia P:  Hemodynamics restored and stable off pressors post-resuscitation, PRBC L cath being performed now, anticoagulants and anti-plt depending on findings. This should be acceptable risk since her graft has been over-sewn.  Discussed risks/benefits of hypothermia with Dr Gwenlyn Found cardiology. I had originally deferred given her bleeding. Now that her graft has been addressed and there is some question about a primary cardiac event causing decompensation, I will initiate hypothermia at 33C. Her CPR time was estimated at 10 minutes.  Statin, other cardiac meds as per Cardiology plans.   RENAL A:   ESRD Bleeding AV graft, repaired P:   Appreciate Dr Oneida Alar' help  Graft will be unusable, will need an HD catheter placed, may be able to place trialysis cath since she will need for meds, etc. Will assess after cath Will notify renal of her admission.   GASTROINTESTINAL A:   SUP, hx GERD P:   protonix per tube NPO  HEMATOLOGIC A:   Acute blood loss anemia P:  follow serial CBC Transfusion goal 8.0 given cardiac event, active blood loss  INFECTIOUS A:   No evidence active infxn P:   Follow clinically off abx  ENDOCRINE A:   DM   P:   SSI per ICU protocol  NEUROLOGIC A:   Encephalopathy  Sedation for MV Therapeutic hypothermia P:   RASS goal: -5 Fentanyl + propofol Cisatracurium  EEG Assess MS once re-warmed   FAMILY  - Updates: No family present at this time, will attempt to call    - Inter-disciplinary family meet or Palliative Care meeting due by: 10/14   Independent CC time 60 minutes  Baltazar Apo, MD, PhD 10/07/2016, 4:52 PM  Pulmonary and Critical Care [phone] or if no answer [phone]

## 2016-10-03 NOTE — Code Documentation (Signed)
First unit O negative emergency release blood being infused with level 1 infuser per verbal order from MD [facility].

## 2016-10-03 NOTE — Interval H&P Note (Signed)
Cath Lab Visit (complete for each Cath Lab visit)  Clinical Evaluation Leading to the Procedure:   ACS: Yes.    Non-ACS:    Anginal Classification: CCS IV  Anti-ischemic medical therapy: No Therapy  Non-Invasive Test Results: No non-invasive testing performed  Prior CABG: No previous CABG      History and Physical Interval Note:  10/15/2016 9:38 AM  Kim Hodge  has presented today for surgery, with the diagnosis of STEMI  The various methods of treatment have been discussed with the patient and family. After consideration of risks, benefits and other options for treatment, the patient has consented to  Procedure(s): LEFT HEART CATH AND CORONARY ANGIOGRAPHY (N/A) as a surgical intervention .  The patient's history has been reviewed, patient examined, no change in status, stable for surgery.  I have reviewed the patient's chart and labs.  Questions were answered to the patient's satisfaction.     Quay Burow

## 2016-10-03 NOTE — Code Documentation (Signed)
No reactions to blood transfusions noted at this time. VS remain stable.

## 2016-10-03 NOTE — ED Notes (Signed)
Pt transporting to CT for scans with RN and RT, to proceed to cath lab after.

## 2016-10-04 ENCOUNTER — Inpatient Hospital Stay (HOSPITAL_COMMUNITY): Payer: Medicare Other

## 2016-10-04 ENCOUNTER — Encounter (HOSPITAL_COMMUNITY): Payer: Self-pay | Admitting: Cardiovascular Disease

## 2016-10-04 DIAGNOSIS — I213 ST elevation (STEMI) myocardial infarction of unspecified site: Secondary | ICD-10-CM

## 2016-10-04 DIAGNOSIS — E1165 Type 2 diabetes mellitus with hyperglycemia: Secondary | ICD-10-CM | POA: Diagnosis present

## 2016-10-04 DIAGNOSIS — I2510 Atherosclerotic heart disease of native coronary artery without angina pectoris: Secondary | ICD-10-CM | POA: Diagnosis present

## 2016-10-04 DIAGNOSIS — J9601 Acute respiratory failure with hypoxia: Secondary | ICD-10-CM

## 2016-10-04 DIAGNOSIS — G931 Anoxic brain damage, not elsewhere classified: Secondary | ICD-10-CM

## 2016-10-04 DIAGNOSIS — R570 Cardiogenic shock: Secondary | ICD-10-CM

## 2016-10-04 DIAGNOSIS — E784 Other hyperlipidemia: Secondary | ICD-10-CM

## 2016-10-04 DIAGNOSIS — IMO0002 Reserved for concepts with insufficient information to code with codable children: Secondary | ICD-10-CM | POA: Diagnosis present

## 2016-10-04 DIAGNOSIS — E118 Type 2 diabetes mellitus with unspecified complications: Secondary | ICD-10-CM

## 2016-10-04 DIAGNOSIS — Z955 Presence of coronary angioplasty implant and graft: Secondary | ICD-10-CM

## 2016-10-04 DIAGNOSIS — I2119 ST elevation (STEMI) myocardial infarction involving other coronary artery of inferior wall: Secondary | ICD-10-CM

## 2016-10-04 DIAGNOSIS — I469 Cardiac arrest, cause unspecified: Secondary | ICD-10-CM

## 2016-10-04 DIAGNOSIS — I10 Essential (primary) hypertension: Secondary | ICD-10-CM

## 2016-10-04 LAB — POCT I-STAT, CHEM 8
BUN: 40 mg/dL — AB (ref 6–20)
BUN: 38 mg/dL — ABNORMAL HIGH (ref 6–20)
CALCIUM ION: 1 mmol/L — AB (ref 1.15–1.40)
CREATININE: 7.7 mg/dL — AB (ref 0.44–1.00)
Calcium, Ion: 0.94 mmol/L — ABNORMAL LOW (ref 1.15–1.40)
Chloride: 108 mmol/L (ref 101–111)
Chloride: 107 mmol/L (ref 101–111)
Creatinine, Ser: 8.5 mg/dL — ABNORMAL HIGH (ref 0.44–1.00)
GLUCOSE: 152 mg/dL — AB (ref 65–99)
Glucose, Bld: 120 mg/dL — ABNORMAL HIGH (ref 65–99)
HCT: 36 % (ref 36.0–46.0)
HEMATOCRIT: 31 % — AB (ref 36.0–46.0)
HEMOGLOBIN: 10.5 g/dL — AB (ref 12.0–15.0)
HEMOGLOBIN: 12.2 g/dL (ref 12.0–15.0)
Potassium: 4.8 mmol/L (ref 3.5–5.1)
Potassium: 5.1 mmol/L (ref 3.5–5.1)
SODIUM: 137 mmol/L (ref 135–145)
Sodium: 136 mmol/L (ref 135–145)
TCO2: 15 mmol/L (ref 0–100)
TCO2: 16 mmol/L (ref 0–100)

## 2016-10-04 LAB — HEMOGLOBIN A1C
Hgb A1c MFr Bld: 5.4 % (ref 4.8–5.6)
Mean Plasma Glucose: 108 mg/dL

## 2016-10-04 LAB — HEPARIN LEVEL (UNFRACTIONATED)
Heparin Unfractionated: 1.26 IU/mL — ABNORMAL HIGH (ref 0.30–0.70)
Heparin Unfractionated: 1.68 IU/mL — ABNORMAL HIGH (ref 0.30–0.70)
Heparin Unfractionated: 0.76 IU/mL — ABNORMAL HIGH (ref 0.30–0.70)

## 2016-10-04 LAB — POCT ACTIVATED CLOTTING TIME
ACTIVATED CLOTTING TIME: 164 s
ACTIVATED CLOTTING TIME: 169 s
ACTIVATED CLOTTING TIME: 180 s
ACTIVATED CLOTTING TIME: 219 s
ACTIVATED CLOTTING TIME: 208 s
ACTIVATED CLOTTING TIME: 213 s
ACTIVATED CLOTTING TIME: 202 s
ACTIVATED CLOTTING TIME: 186 s
ACTIVATED CLOTTING TIME: 208 s
Activated Clotting Time: 109 seconds
Activated Clotting Time: 588 seconds
Activated Clotting Time: 191 seconds
Activated Clotting Time: 208 seconds
Activated Clotting Time: 252 seconds
Activated Clotting Time: 257 seconds
Activated Clotting Time: 208 seconds
Activated Clotting Time: 202 seconds
Activated Clotting Time: 202 seconds

## 2016-10-04 LAB — GLUCOSE, CAPILLARY
GLUCOSE-CAPILLARY: 107 mg/dL — AB (ref 65–99)
GLUCOSE-CAPILLARY: 117 mg/dL — AB (ref 65–99)
GLUCOSE-CAPILLARY: 128 mg/dL — AB (ref 65–99)
GLUCOSE-CAPILLARY: 130 mg/dL — AB (ref 65–99)
GLUCOSE-CAPILLARY: 139 mg/dL — AB (ref 65–99)
GLUCOSE-CAPILLARY: 144 mg/dL — AB (ref 65–99)
Glucose-Capillary: 153 mg/dL — ABNORMAL HIGH (ref 65–99)
Glucose-Capillary: 137 mg/dL — ABNORMAL HIGH (ref 65–99)

## 2016-10-04 LAB — BASIC METABOLIC PANEL
ANION GAP: 13 (ref 5–15)
ANION GAP: 14 (ref 5–15)
ANION GAP: 13 (ref 5–15)
BUN: 53 mg/dL — ABNORMAL HIGH (ref 6–20)
BUN: 55 mg/dL — ABNORMAL HIGH (ref 6–20)
BUN: 26 mg/dL — ABNORMAL HIGH (ref 6–20)
CALCIUM: 7.2 mg/dL — AB (ref 8.9–10.3)
CALCIUM: 7.1 mg/dL — AB (ref 8.9–10.3)
CO2: 15 mmol/L — ABNORMAL LOW (ref 22–32)
CO2: 15 mmol/L — ABNORMAL LOW (ref 22–32)
CO2: 16 mmol/L — ABNORMAL LOW (ref 22–32)
Calcium: 7.1 mg/dL — ABNORMAL LOW (ref 8.9–10.3)
Chloride: 105 mmol/L (ref 101–111)
Chloride: 105 mmol/L (ref 101–111)
Chloride: 106 mmol/L (ref 101–111)
Creatinine, Ser: 10.25 mg/dL — ABNORMAL HIGH (ref 0.44–1.00)
Creatinine, Ser: 10.13 mg/dL — ABNORMAL HIGH (ref 0.44–1.00)
Creatinine, Ser: 5.84 mg/dL — ABNORMAL HIGH (ref 0.44–1.00)
GFR, EST AFRICAN AMERICAN: 4 mL/min — AB (ref >60)
GFR, EST AFRICAN AMERICAN: 4 mL/min — AB (ref >60)
GFR, EST AFRICAN AMERICAN: 8 mL/min — AB (ref >60)
GFR, EST NON AFRICAN AMERICAN: 4 mL/min — AB (ref >60)
GFR, EST NON AFRICAN AMERICAN: 4 mL/min — AB (ref >60)
GFR, EST NON AFRICAN AMERICAN: 7 mL/min — AB (ref >60)
GLUCOSE: 116 mg/dL — AB (ref 65–99)
Glucose, Bld: 133 mg/dL — ABNORMAL HIGH (ref 65–99)
Glucose, Bld: 146 mg/dL — ABNORMAL HIGH (ref 65–99)
POTASSIUM: 5.9 mmol/L — AB (ref 3.5–5.1)
POTASSIUM: 4.9 mmol/L (ref 3.5–5.1)
Potassium: 5.5 mmol/L — ABNORMAL HIGH (ref 3.5–5.1)
SODIUM: 133 mmol/L — AB (ref 135–145)
SODIUM: 134 mmol/L — AB (ref 135–145)
SODIUM: 135 mmol/L (ref 135–145)

## 2016-10-04 LAB — BLOOD GAS, ARTERIAL
ACID-BASE DEFICIT: 12.7 mmol/L — AB (ref 0.0–2.0)
BICARBONATE: 13.8 mmol/L — AB (ref 20.0–28.0)
FIO2: 40
O2 SAT: 96.1 %
PH ART: 7.267 — AB (ref 7.350–7.450)
Patient temperature: 89.6
pCO2 arterial: 28.7 mmHg — ABNORMAL LOW (ref 32.0–48.0)
pO2, Arterial: 78 mmHg — ABNORMAL LOW (ref 83.0–108.0)

## 2016-10-04 LAB — CBC
HEMATOCRIT: 39.4 % (ref 36.0–46.0)
Hemoglobin: 12.9 g/dL (ref 12.0–15.0)
MCH: 31 pg (ref 26.0–34.0)
MCHC: 32.7 g/dL (ref 30.0–36.0)
MCV: 94.7 fL (ref 78.0–100.0)
Platelets: 177 10*3/uL (ref 150–400)
RBC: 4.16 MIL/uL (ref 3.87–5.11)
RDW: 18 % — AB (ref 11.5–15.5)
WBC: 29.9 10*3/uL — ABNORMAL HIGH (ref 4.0–10.5)

## 2016-10-04 LAB — RENAL FUNCTION PANEL
ALBUMIN: 2.2 g/dL — AB (ref 3.5–5.0)
ANION GAP: 13 (ref 5–15)
BUN: 33 mg/dL — ABNORMAL HIGH (ref 6–20)
CALCIUM: 7.1 mg/dL — AB (ref 8.9–10.3)
CO2: 14 mmol/L — ABNORMAL LOW (ref 22–32)
CREATININE: 6.48 mg/dL — AB (ref 0.44–1.00)
Chloride: 106 mmol/L (ref 101–111)
GFR, EST AFRICAN AMERICAN: 7 mL/min — AB (ref >60)
GFR, EST NON AFRICAN AMERICAN: 6 mL/min — AB (ref >60)
Glucose, Bld: 148 mg/dL — ABNORMAL HIGH (ref 65–99)
PHOSPHORUS: 6.4 mg/dL — AB (ref 2.5–4.6)
Potassium: 4.9 mmol/L (ref 3.5–5.1)
SODIUM: 133 mmol/L — AB (ref 135–145)

## 2016-10-04 LAB — ECHOCARDIOGRAM COMPLETE
HEIGHTINCHES: 63 in
Weight: 2941.82 oz

## 2016-10-04 LAB — LIPID PANEL
Cholesterol: 104 mg/dL (ref 0–200)
HDL: 18 mg/dL — ABNORMAL LOW (ref >40)
LDL CALC: 48 mg/dL (ref 0–99)
Total CHOL/HDL Ratio: 5.8 RATIO
Triglycerides: 189 mg/dL — ABNORMAL HIGH (ref <150)
VLDL: 38 mg/dL (ref 0–40)

## 2016-10-04 LAB — TROPONIN I: Troponin I: 5.91 ng/mL (ref <0.03)

## 2016-10-04 LAB — PHOSPHORUS: PHOSPHORUS: 7.7 mg/dL — AB (ref 2.5–4.6)

## 2016-10-04 LAB — MAGNESIUM: Magnesium: 2.6 mg/dL — ABNORMAL HIGH (ref 1.7–2.4)

## 2016-10-04 LAB — ALBUMIN: ALBUMIN: 2.4 g/dL — AB (ref 3.5–5.0)

## 2016-10-04 LAB — APTT: aPTT: 58 seconds — ABNORMAL HIGH (ref 24–36)

## 2016-10-04 MED ORDER — VASOPRESSIN 20 UNIT/ML IV SOLN
0.0300 [IU]/min | INTRAVENOUS | Status: DC
  Administered 2016-10-04 – 2016-10-05 (×2): 0.03 [IU]/min via INTRAVENOUS
  Filled 2016-10-04 (×2): qty 2

## 2016-10-04 MED ORDER — SODIUM CHLORIDE 0.9 % IJ SOLN
250.0000 [IU]/h | INTRAMUSCULAR | Status: DC
  Administered 2016-10-04: 250 [IU]/h via INTRAVENOUS_CENTRAL
  Administered 2016-10-05: 300 [IU]/h via INTRAVENOUS_CENTRAL
  Administered 2016-10-05: 400 [IU]/h via INTRAVENOUS_CENTRAL
  Administered 2016-10-06: 800 [IU]/h via INTRAVENOUS_CENTRAL
  Administered 2016-10-06: 900 [IU]/h via INTRAVENOUS_CENTRAL
  Administered 2016-10-06: 950 [IU]/h via INTRAVENOUS_CENTRAL
  Administered 2016-10-07 (×3): 1000 [IU]/h via INTRAVENOUS_CENTRAL
  Filled 2016-10-04 (×12): qty 2

## 2016-10-04 MED ORDER — SODIUM CHLORIDE 0.9 % IV SOLN
2.0000 mg/h | INTRAVENOUS | Status: DC
  Administered 2016-10-04 (×3): 5 mg/h via INTRAVENOUS
  Filled 2016-10-04 (×3): qty 10

## 2016-10-04 MED ORDER — SODIUM CHLORIDE 0.9 % IV BOLUS (SEPSIS)
1000.0000 mL | Freq: Once | INTRAVENOUS | Status: AC
  Administered 2016-10-04: 1000 mL via INTRAVENOUS

## 2016-10-04 MED ORDER — PRO-STAT SUGAR FREE PO LIQD
60.0000 mL | Freq: Three times a day (TID) | ORAL | Status: DC
  Administered 2016-10-05 – 2016-10-07 (×8): 60 mL
  Filled 2016-10-04 (×8): qty 60

## 2016-10-04 MED ORDER — SODIUM CHLORIDE 0.9 % IV BOLUS (SEPSIS): 1000.0000 mL | Freq: Once | INTRAVENOUS | Status: DC

## 2016-10-04 MED ORDER — HEPARIN BOLUS VIA INFUSION (CRRT)
1000.0000 [IU] | INTRAVENOUS | Status: DC | PRN
  Filled 2016-10-04: qty 1000

## 2016-10-04 MED ORDER — SODIUM CHLORIDE 0.9 % IV BOLUS (SEPSIS)
1000.0000 mL | Freq: Once | INTRAVENOUS | Status: AC
Start: 2016-10-04 — End: 2016-10-05
  Administered 2016-10-05: 1000 mL via INTRAVENOUS

## 2016-10-04 MED ORDER — NOREPINEPHRINE BITARTRATE 1 MG/ML IV SOLN
0.0000 ug/min | INTRAVENOUS | Status: DC
  Administered 2016-10-04: 75 ug/min via INTRAVENOUS
  Administered 2016-10-04: 23 ug/min via INTRAVENOUS
  Administered 2016-10-04: 60 ug/min via INTRAVENOUS
  Administered 2016-10-04: 45 ug/min via INTRAVENOUS
  Administered 2016-10-05: 14 ug/min via INTRAVENOUS
  Administered 2016-10-05: 74 ug/min via INTRAVENOUS
  Filled 2016-10-04 (×7): qty 16

## 2016-10-04 MED ORDER — HEPARIN (PORCINE) IN NACL 100-0.45 UNIT/ML-% IJ SOLN: 150.0000 [IU]/h | INTRAMUSCULAR | Status: DC

## 2016-10-04 MED ORDER — NEPRO/CARBSTEADY PO LIQD
1000.0000 mL | ORAL | Status: DC
  Administered 2016-10-05 – 2016-10-06 (×2): 1000 mL
  Filled 2016-10-04 (×4): qty 1000

## 2016-10-04 MED ORDER — HEPARIN (PORCINE) IN NACL 100-0.45 UNIT/ML-% IJ SOLN
450.0000 [IU]/h | INTRAMUSCULAR | Status: DC
  Administered 2016-10-04: 600 [IU]/h via INTRAVENOUS
  Filled 2016-10-04: qty 250

## 2016-10-04 MED ORDER — ALBUMIN HUMAN 25 % IV SOLN
12.5000 g | Freq: Once | INTRAVENOUS | Status: AC
  Administered 2016-10-04: 12.5 g via INTRAVENOUS
  Filled 2016-10-04: qty 50

## 2016-10-04 MED FILL — Verapamil HCl IV Soln 2.5 MG/ML: INTRAVENOUS | Qty: 2 | Status: AC

## 2016-10-04 NOTE — Progress Notes (Signed)
ANTICOAGULATION CONSULT NOTE - Initial Consult  Pharmacy Consult for heparin Indication: CRRT clotting despite heparin syringe  Allergies  Allergen Reactions  . Ace Inhibitors Anaphylaxis, Shortness Of Breath and Swelling    Tongue and throat swells, cannot breathe  . Corn-Containing Products Diarrhea    Family states pt cannot eat corn    Patient Measurements: Height: _0  (160 cm) Weight: 171 lb 15.3 oz (78 kg) IBW/kg (Calculated) : 52.4 Heparin Dosing Weight: 75kg  Vital Signs: Temp: 92.3 F (33.5 C) (10/09 0600) Temp Source: Core (Comment) (10/09 0400) BP: 98/52 (10/09 0610) Pulse Rate: 104 (10/09 0610)  Labs:  Recent Labs  10/24/2016 0742  10/17/2016 1219 09/29/2016 1235  10/01/2016 1546 09/27/2016 1744 10/18/2016 2050 10/05/2016 2200 10/04/16 0230 10/04/16 0350  HGB 8.4*  < > 13.4  --   < > 13.9 13.3  --   --  12.9  --   HCT 27.7*  < > 41.5  --   < > 41.0 39.0  --   --  39.4  --   PLT 188  --  254  --   --   --   --   --   --  177  --   APTT  --   --   --  128*  --   --   --  66*  --   --   --   LABPROT 19.6*  --   --  43.7*  --   --   --  24.6*  --   --   --   INR 1.64  --   --  4.46*  --   --   --  2.18  --   --   --   CREATININE 11.27*  < >  --  11.25*  < > 11.50* 11.30*  --  11.32*  --  10.25*  TROPONINI  --   --   --  0.67*  --   --   --   --  3.45* 5.91*  --   < > = values in this interval not displayed.  Estimated Creatinine Clearance: 5.7 mL/min (by C-G formula based on SCr of 10.25 mg/dL (H)).   Medical History: Past Medical History:  Diagnosis Date  . Anemia   . Chronic bronchitis   . Daily headache   . Degenerative joint disease    "right knee" (11/06/2015)  . Depression   . Diabetes mellitus    "only when I take steroids" (11/06/2015)  . ESRD (end stage renal disease) on dialysis [facility])    "[place]; off [employer]; MWF" (11/06/2015)  . Family history of adverse reaction to anesthesia    "sister took a long long time to wake up"  .  Fibrosis of uterus   . GERD (gastroesophageal reflux disease)   . Hx of colonic polyps 02/25/2016  . Hyperlipidemia   . Hyperparathyroidism, secondary renal ([place])   . Hypertension   . Membranoproliferative glomerulonephritis    type 1  . NEPHROTIC SYNDROME 05/07/2010   Qualifier: Diagnosis of  By: Hollie Salk CMA, [name]    . Neuromuscular disorder (HCC)    neuropathy  . Obesity   . Pneumonia ?01/2015; 06/09/2015; 11/06/2015  . Shortness of breath dyspnea     Medications:  Prescriptions Prior to Admission  Medication Sig Dispense Refill Last Dose  . albuterol (PROVENTIL HFA;VENTOLIN HFA) 108 (90 Base) MCG/ACT inhaler Inhale 1-2 puffs into the lungs every 6 (six) hours as needed for wheezing  or shortness of breath. 1 Inhaler 1 unknown  . amLODipine (NORVASC) 10 MG tablet Take 10 mg by mouth daily.      Marland Kitchen gabapentin (NEURONTIN) 300 MG capsule Take 300 mg by mouth at bedtime.      . pravastatin (PRAVACHOL) 20 MG tablet Take 20 mg by mouth at bedtime. Reported on 01/27/2016   Taking  . acetaminophen (TYLENOL) 500 MG tablet Take 1,500 mg by mouth every 8 (eight) hours as needed for moderate pain or headache. Reported on 06/08/2016   Taking  . calcium acetate (PHOSLO) 667 MG capsule Take by mouth 3 (three) times daily with meals. Reported on 06/15/2016   Taking  . cinacalcet (SENSIPAR) 90 MG tablet Take 90 mg by mouth daily.   Taking  . famotidine (ACID REDUCER MAXIMUM STRENGTH) 20 MG tablet Take 20 mg by mouth as needed for heartburn or indigestion.   Taking  . fexofenadine (ALLEGRA) 180 MG tablet Take 180 mg by mouth daily.   Taking   Scheduled:  . artificial tears  1 application Both Eyes Z9D  . aspirin  81 mg Oral Daily  . atorvastatin  80 mg Oral q1800  . chlorhexidine gluconate (MEDLINE KIT)  15 mL Mouth Rinse BID  . clopidogrel  75 mg Oral Q breakfast  . insulin aspart  1-3 Units Subcutaneous Q4H  . insulin glargine  10 Units Subcutaneous QHS  . mouth rinse  15 mL Mouth Rinse 10 times per  day  . pantoprazole sodium  40 mg Per Tube Daily  . sodium chloride flush  3 mL Intravenous Q12H   Infusions:  . sodium chloride Stopped (10/18/2016 1220)  . cisatracurium (NIMBEX) infusion 1.496 mcg/kg/min (10/02/2016 1900)  . dextrose    . fentaNYL infusion INTRAVENOUS 100 mcg/hr (10/11/2016 1900)  . heparin 10,000 units/ 20 mL infusion syringe 250 Units/hr (10/04/16 0459)  . insulin (NOVOLIN-R) infusion Stopped (09/27/2016 1950)  . midazolam (VERSED) infusion 5 mg/hr (10/04/16 0215)  . norepinephrine (LEVOPHED) Adult infusion 50 mcg/min (10/04/16 3570)  . dialysis replacement fluid (prismasate) 400 mL/hr at 10/04/16 0253  . dialysis replacement fluid (prismasate) 200 mL/hr at 10/04/16 0253  . dialysate (PRISMASATE) 1,800 mL/hr at 10/04/16 0253  . vasopressin (PITRESSIN) infusion - *FOR SHOCK*      Assessment: 60yo female presented to ED s/p CPR after losing ~2L blood (per EMS) from fistula, rec'd TXA and fistula sewed off to stop bleeding, started mass infusion protocol, went to cath lab for STEMI, and now on hypothermia protocol and CRRT; overnight CRRT has clotted off several times, added heparin syringe without much benefit, now to start systemic heparin.  Goal of Therapy:  Heparin level 0.3-0.7 units/ml Monitor platelets by anticoagulation protocol: Yes   Plan:  Will start heparin gtt at low rate of 600 units/hr given hypothermia and monitor heparin levels and CBC closely.  Wynona Neat, PharmD, BCPS  10/04/2016,6:32 AM

## 2016-10-04 NOTE — Progress Notes (Signed)
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  Inpatient Medications    . artificial tears  1 application Both Eyes V5I  . aspirin  81 mg Oral Daily  . atorvastatin  80 mg Oral q1800  . chlorhexidine gluconate (MEDLINE KIT)  15 mL Mouth Rinse BID  . clopidogrel  75 mg Oral Q breakfast  . insulin aspart  1-3 Units Subcutaneous Q4H  . insulin glargine  10 Units Subcutaneous QHS  . mouth rinse  15 mL Mouth Rinse 10 times per day  . pantoprazole sodium  40 mg Per Tube Daily    Vital Signs    Vitals:   10/04/16 0610 10/04/16 0700 10/04/16 0800 10/04/16 0900  BP: (!) 98/52   105/81  Pulse: (!) 104     Resp: (!) 24 (!) 24 (!) 23 (!) 24  Temp:  (!) 92.7 F (33.7 C) (!) 91.8 F (33.2 C) (!) 91.4 F (33 C)  TempSrc:  Core (Comment) Core (Comment) Core (Comment)  SpO2: 98%  98%   Weight: 83.4 kg (183 lb 13.8 oz)     Height: 5' 3"  (1.6 m)       Intake/Output Summary (Last 24 hours) at 10/04/16 0939 Last data filed at 10/04/16 0900  Gross per 24 hour  Intake             2070 ml  Output              524 ml  Net             1546 ml   Filed Weights   10/21/2016 1008 10/04/16  0610  Weight: 78 kg (171 lb 15.3 oz) 83.4 kg (183 lb 13.8 oz)    Physical Exam   GEN: Intubated & sedated, unresponsive; Arctic sun place HEENT: Grossly normal.  ETT in place Neck: Supple, no JVD, carotid bruits, or masses. Cardiac: Tachy but RRR, distant heart sounds. No obvious murmurs, rubs, or gallops. No clubbing, cyanosis, edema.  Radials/DP/PT 2+ and equal bilaterally.  Respiratory:  Respirations regular and unlabored, clear to auscultation bilaterally. GI: Soft, nontender, nondistended, BS + x 4. MS: no deformity or atrophy. Skin: warm and dry, no rash.  Fistula without bleed Neuro:  sedated Psych: sedated  Labs    CBC  Recent Labs  10/22/2016 1219  10/15/2016 1744 10/04/16 0230  WBC 35.3*  --   --  29.9*  HGB 13.4  < > 13.3 12.9  HCT 41.5  < > 39.0 39.4  MCV 96.5  --   --  94.7  PLT 254  --   --  177  < > = values in this interval not  displayed. Basic Metabolic Panel  Recent Labs  09/26/2016 0952  10/04/16 0230 10/04/16 0350 10/04/16 0624  NA  --   < >  --  133* 134*  K  --   < >  --  5.9* 5.5*  CL  --   < >  --  105 105  CO2  --   < >  --  15* 15*  GLUCOSE  --   < >  --  116* 133*  BUN  --   < >  --  53* 55*  CREATININE  --   < >  --  10.25* 10.13*  CALCIUM  --   < >  --  7.1* 7.2*  MG 2.4  --  2.6*  --   --   PHOS  --   --  7.7*  --   --   < > = values in this interval not displayed. Liver Function Tests  Recent Labs  10/25/2016 0742 10/11/2016 0952 10/04/16 0230  AST 47* 152*  --   ALT 33 133*  --   ALKPHOS 81 104  --   BILITOT 0.3 0.5  --   PROT 3.5* 4.6*  --   ALBUMIN 1.7* 2.3* 2.4*   No results for input(s): LIPASE, AMYLASE in the last 72 hours. Cardiac Enzymes  Recent Labs  10/25/2016 1235 10/11/2016 2200 10/04/16 0230  TROPONINI 0.67* 3.45* 5.91*   BNP Invalid input(s): POCBNP D-Dimer No results for input(s): DDIMER in the last 72 hours. Hemoglobin A1C  Recent Labs  09/29/2016 1235  HGBA1C 5.4   Fasting Lipid Panel  Recent  Labs  10/04/16 0230  CHOL 104  HDL 18*  LDLCALC 48  TRIG 189*  CHOLHDL 5.8   Thyroid Function Tests  Recent Labs  10/26/2016 1235  TSH 1.655    Telemetry    Sinus tachycardia low 100s  ECG    S Tachy 107 (not Aflutter) Persistent Inferior (subtle lateral TWI) STE  Cardiology    Diagnostic Diagram      Post-Intervention Diagram          Overlapping Synergy DES 2.25 mm x 20 & 12 EF ~50-55%   Ct Head Wo Contrast  Result Date: 10/01/2016 CLINICAL DATA:  60 year old female with a history of altered mental status. EXAM: CT HEAD WITHOUT CONTRAST TECHNIQUE: Contiguous axial images were obtained from the base of the skull through the vertex without intravenous contrast. COMPARISON:  CT 10/01/2014 FINDINGS: Brain: No acute intracranial hemorrhage. No midline shift or mass effect. Gray-white differentiation maintained. Unremarkable appearance of the ventricular system. Vascular: Calcifications of the intracranial vasculature. Skull: No acute fracture.  No aggressive bone lesion identified. Sinuses/Orbits: Air-fluid level of the left maxillary sinus. Other: None IMPRESSION: No CT evidence of acute intracranial abnormality. These results were discussed by telephone at the time of interpretation on 10/07/2016 at 10:00 am to Dr. Gwenlyn Found, who verbally acknowledged these results. Air-fluid level of the left maxillary sinus, potentially sinusitis. Signed, Dulcy Fanny. Earleen Newport, DO [facility] Radiology Electronically Signed   By: Corrie Mckusick D.O.   On: 10/01/2016 10:00   Dg Chest Port 1 View  Result Date: 10/04/2016 CLINICAL DATA:  Central line placement. EXAM: PORTABLE CHEST 1 VIEW COMPARISON:  Chest radiograph yesterday at 0817 hour FINDINGS: Endotracheal tube 1.7 cm from the carina. Enteric tube and probable esophageal temperature probe in place. New left internal jugular central venous catheter tip in the mid SVC. No pneumothorax. Lung volumes are  low. Minimal left basilar atelectasis. Heart is normal in size. Again seen thoracic aortic atherosclerosis. No pulmonary edema. No pleural fluid. IMPRESSION: 1. Tip of the left central line in the mid SVC.  No pneumothorax. 2. Endotracheal and enteric tubes remain in place. 3. Mild left basilar atelectasis. Electronically Signed   By: Jeb Levering M.D.   On: 10/04/2016 01:56   Dg Chest Portable 1 View  Result Date: 10/10/2016 CLINICAL DATA:  Intubated. EXAM: PORTABLE CHEST 1 VIEW COMPARISON:  06/08/2016 FINDINGS: Endotracheal tube tip is 1 cm above the carina. Heart size is normal. Aortic atherosclerosis. Pulmonary vascularity is normal. Lungs are clear. No acute bone finding. IMPRESSION: Endotracheal tube tip 1 cm above the carina. Lungs clear. Vascularity normal. Aortic atherosclerosis Electronically Signed   By: Nelson Chimes M.D.   On: 09/28/2016 08:53   Dg Abd Portable 1 View  Result Date: 10/20/2016 CLINICAL DATA:  Intubated.  Nasogastric placement. EXAM: PORTABLE ABDOMEN - 1 VIEW COMPARISON:  None. FINDINGS: Nasogastric tube enters the stomach, extends to the fundus in has its tip in the body antrum junction region. Small bowel gas pattern appears unremarkable. There is an unusual angular gas collection in or projected over the left abdomen. The nature of this is uncertain. Is there any concern regarding perforated viscus? Additional abdominal imaging suggested if abdominal pathology is suspected. Extensive chronic degenerative changes affect the lower lumbar spine and sacroiliac joints. IMPRESSION: Nasogastric tube in the stomach. Unusual angular gas shadow in or over the left abdomen. Is there concern regarding abdominal pathology? If so, CT and/or additional radiography suggested. Electronically Signed   By: Nelson Chimes M.D.   On: 10/19/2016 08:57    Patient Profile     60 y/o woman without prior Cardiac History, ESRD on HD, tobacco abuse -> called EMS b/c bleeding HD fistula --> upon EMS  arrival was noted to be in cardiac arrest --> CPR initiated (significant blood loss noted).  Upon arrival to ER - EKG demonstrated Inferolateral STE (not seen PTA).  Taken for urgent cath after fistula was sutured & bleeding stopped. Not felt to be [place] Candidate due to bleed & co-morbidities.  Originally HypovolemicPossible hemorrhagic Shock was considered the etiology.  Cath revealed extensive MV CAD with occluded Cx - treated wit overlapping DES Central lines placed for pressors & HD access. Transfused PRBC for initial hemoglobin of roughly 8.5. Was given tranexamic acid in the ED for extensive bleeding -- > question if extensive clotting after this administration could potentially responsible for circumflex artery occlusion as the ST elevation did not occur until after arrival and this was administered.  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 She had bleeding which was significant, however after 2 units of blood her hemoglobin came back up to 12.9. Perhaps she became enough anemic with her existing disease  that she suffered a cardiac arrest. Is unclear as to the timing of when the ST elevation MI occurred, but it would appear that it may been following the treatment of bleeding with tranexamic acid.  Has been on dialysis since yesterday and has had to  stop several times due to clotting probably still related to this administration of tranexamic acid  This is somewhat concerning in setting of recent coronary stent placement - now on heparin drip. Remains on [place] cooling Protocol, but with Cardizem and shock is requiring significant vasopressors to maintain stable pressures. LVEDP was quite low at catheterization, suggesting volume depletion - would avoid excess fluid removal and dialysis.  From a cardiac standpoint she now has 2 drug-eluting stents in the circumflex with a severe existing disease in LAD and diffusely in the RCA.  High-dose atorvastatin, aspirin and Plavix ordered.  Remains on pressors, therefore unable to consider beta blocker. Signed thank you no arrhythmias noted.  Remains on IV heparin  Echo pending, but EF looked normal by cath. LVEDP is also low.  Hemoglobin seems stable now after transfusion. This begs the question of how bad was bleed really.  Supportive care until rewarming. Will then require [place] neurologic evaluation.  As her presentation was not originally cardiac driven, I suspect that she was not overtly symptomatic with the existing CAD she had. With that in mind, in light of recent bleeding issues, the potential best option for she to recover be to consider medical management of the LAD and RCA disease with thoughts to potentially evaluate as an outpatient with either a stress test or relook catheterization. She is now already on dual antiplatelet therapy.  We'll follow   Signed, Glenetta Hew, M.D., M.S. Interventional Cardiologist   Pager # [phone] Phone # [phone] [address] [place], [place] [address]  10/04/2016, 9:39 AM

## 2016-10-04 NOTE — Procedures (Signed)
Central Venous Catheter Insertion Procedure Note Kim Hodge [id number] [birth year]  Procedure: Insertion of Central Venous Catheter Indications: Assessment of intravascular volume, Drug and/or fluid administration and Frequent blood sampling  Procedure Details Consent: Risks of procedure as well as the alternatives and risks of each were explained to the (patient/caregiver).  Consent for procedure obtained. Time Out: Verified patient identification, verified procedure, site/side was marked, verified correct patient position, special equipment/implants available, medications/allergies/relevent history reviewed, required imaging and test results available.  Performed  Maximum sterile technique was used including antiseptics, cap, gloves, gown, hand hygiene, mask and sheet. Skin prep: Chlorhexidine; local anesthetic administered A antimicrobial bonded/coated triple lumen catheter was placed in the left internal jugular vein using the Seldinger technique.  Evaluation Blood flow good Complications: No apparent complications Patient did tolerate procedure well. Chest X-ray ordered to verify placement.  CXR: pending.  Kim Hodge. 10/04/2016, 12:56 AM  [place]  US shows rt IJ stenosis likel;y

## 2016-10-04 NOTE — Progress Notes (Signed)
[place] Progress Note Patient Name: Kim Hodge DOB: 1956-01-01 MRN: [id number]   Date of Service  10/04/2016  HPI/Events of Note  Stringy clots on CRRT  eICU Interventions  Start IV heparin , note use of tranexamic acid earlier. Renal to guide use of IVheparin     Intervention Category Intermediate Interventions: Coagulopathy - evaluation and management  ALVA,RAKESH V. 10/04/2016, 6:31 AM

## 2016-10-04 NOTE — Progress Notes (Signed)
Pt CVP still 0-1, Levo @ 53mcg, MAP still only 70 and per hypothermia protocol want MAP 80, Paged CCM, orders to give 1L bolus. Will continue to monitor closely.

## 2016-10-04 NOTE — Progress Notes (Signed)
[place] Progress Note Patient Name: Kim Hodge DOB: 04-06-1956 MRN: [id number]   Date of Service  10/04/2016  HPI/Events of Note  HR = 124, BP = 124/62 and CVP = 3. Albumin = 2.2  eICU Interventions  Will order: 1. 25% Albumin 12.5 gm IV now. 2. 0.9 NaCl 1 liter IV over 1 hour now.     Intervention Category Major Interventions: Hypovolemia - evaluation and treatment with fluids  Sommer,Steven Eugene 10/04/2016, 11:19 PM

## 2016-10-04 NOTE — Progress Notes (Signed)
PULMONARY / CRITICAL CARE MEDICINE   Name: Kim Hodge MRN: [id number] DOB: 1956/12/10    ADMISSION DATE:  10/26/2016  REFERRING MD:  Dr Regenia Skeeter, ED  CHIEF COMPLAINT:  PEA arrest  HISTORY OF PRESENT ILLNESS:   60 yo woman, hx tobacco, ESRD on HD (MWF), DM. Has been seen for thinning above her LUE graft, some difficulty getting hemostasis after HD, thrombectomy after it clotted in mid September. She called EMS am 10/8 with acute heavy bleeding from the graft site. On EMS arrival she was unresponsive with estimated 2L blood in the bed. Received CPR x estimated 10 minutes with [facility]. Intubated in the ED, reportedly had a cough and gag but no other significant neuro responses at that time. Initial Hgb 8.4 > 8.5 down from baseline 10.  She received tranexamic acid in the ED, was seen by Dr Oneida Alar w vascular who over-sewed an ulcerated area over the graft site. Noted that the graft is now clotted, not functional. Subsequent ECG showed evolving ST elevations and cath lab was recommended. She is now in the cath lab on MV, sedation initiated.   SUBJECTIVE: Clotting CRRT overnight, heparin drip started  VITAL SIGNS: BP 105/81   Pulse (!) 104   Temp (!) 91.4 F (33 C) (Core (Comment))   Resp (!) 24   Ht 5\' 3"  (1.6 m)   Wt 83.4 kg (183 lb 13.8 oz)   SpO2 98%   BMI 32.57 kg/m   HEMODYNAMICS: CVP:  [0 mmHg-6 mmHg] 0 mmHg  VENTILATOR SETTINGS: Vent Mode: PRVC FiO2 (%):  [40 %] 40 % Set Rate:  [24 bmp] 24 bmp Vt Set:  [420 mL] 420 mL PEEP:  [5 cmH20] 5 cmH20 Plateau Pressure:  [17 cmH20-19 cmH20] 17 cmH20  INTAKE / OUTPUT: I/O last 3 completed shifts: In: 2338.1 [I.V.:1618.1; Blood:620; NG/GT:100] Out: 524 [Emesis/NG output:500; Other:24]  PHYSICAL EXAMINATION: General:  Ill appearing woman, sedated and intubated Neuro:  Paralyzed, pupils small but react HEENT:  ETT in place,  Cardiovascular:  Regular, tachy, no M Lungs:  Distant but clear Abdomen:  Soft and tympanitic, +  BS Musculoskeletal:  No deformities, no edema Skin:  No rash  LABS:  BMET  Recent Labs Lab 10/10/2016 2200 10/04/16 0350 10/04/16 0624  NA 132* 133* 134*  K 6.2* 5.9* 5.5*  CL 104 105 105  CO2 14* 15* 15*  BUN 55* 53* 55*  CREATININE 11.32* 10.25* 10.13*  GLUCOSE 157* 116* 133*    Electrolytes  Recent Labs Lab 10/22/2016 0952  10/19/2016 2200 10/04/16 0230 10/04/16 0350 10/04/16 0624  CALCIUM  --   < > 7.3*  --  7.1* 7.2*  MG 2.4  --   --  2.6*  --   --   PHOS  --   --   --  7.7*  --   --   < > = values in this interval not displayed.  CBC  Recent Labs Lab 10/09/2016 0742  10/04/2016 1219  09/26/2016 1546 10/17/2016 1744 10/04/16 0230  WBC 11.9*  --  35.3*  --   --   --  29.9*  HGB 8.4*  < > 13.4  < > 13.9 13.3 12.9  HCT 27.7*  < > 41.5  < > 41.0 39.0 39.4  PLT 188  --  254  --   --   --  177  < > = values in this interval not displayed.  Coag's  Recent Labs Lab 10/26/2016 0742 10/22/2016 1235 10/23/2016 2050 10/04/16  [id number]  APTT  --  128* 66* 58*  INR 1.64 4.46* 2.18  --     Sepsis Markers  Recent Labs Lab 10/04/2016 0856  LATICACIDVEN 5.27*    ABG  Recent Labs Lab 10/10/2016 0850 09/28/2016 1558 10/04/16 0420  PHART 7.113* 7.242* 7.267*  PCO2ART 39.9 28.6* 28.7*  PO2ART 170.0* 81.0* 78.0*    Liver Enzymes  Recent Labs Lab 09/26/2016 0742 10/04/2016 0952 10/04/16 0230  AST 47* 152*  --   ALT 33 133*  --   ALKPHOS 81 104  --   BILITOT 0.3 0.5  --   ALBUMIN 1.7* 2.3* 2.4*    Cardiac Enzymes  Recent Labs Lab 09/30/2016 1235 10/15/2016 2200 10/04/16 0230  TROPONINI 0.67* 3.45* 5.91*    Glucose  Recent Labs Lab 10/23/2016 2202 10/15/2016 2308 10/04/16 0117 10/04/16 0235 10/04/16 0350 10/04/16 0822  GLUCAP 150* 148* 153* 144* 117* 137*    Imaging Ct Head Wo Contrast  Result Date: 10/23/2016 CLINICAL DATA:  60 year old female with a history of altered mental status. EXAM: CT HEAD WITHOUT CONTRAST TECHNIQUE: Contiguous axial images were  obtained from the base of the skull through the vertex without intravenous contrast. COMPARISON:  CT 10/01/2014 FINDINGS: Brain: No acute intracranial hemorrhage. No midline shift or mass effect. Gray-white differentiation maintained. Unremarkable appearance of the ventricular system. Vascular: Calcifications of the intracranial vasculature. Skull: No acute fracture.  No aggressive bone lesion identified. Sinuses/Orbits: Air-fluid level of the left maxillary sinus. Other: None IMPRESSION: No CT evidence of acute intracranial abnormality. These results were discussed by telephone at the time of interpretation on 10/20/2016 at 10:00 am to Dr. Gwenlyn Found, who verbally acknowledged these results. Air-fluid level of the left maxillary sinus, potentially sinusitis. Signed, Dulcy Fanny. Earleen Newport, DO [facility] Electronically Signed   By: Corrie Mckusick D.O.   On: 10/02/2016 10:00   Dg Chest Port 1 View  Result Date: 10/04/2016 CLINICAL DATA:  Central line placement. EXAM: PORTABLE CHEST 1 VIEW COMPARISON:  Chest radiograph yesterday at 0817 hour FINDINGS: Endotracheal tube 1.7 cm from the carina. Enteric tube and probable esophageal temperature probe in place. New left internal jugular central venous catheter tip in the mid SVC. No pneumothorax. Lung volumes are low. Minimal left basilar atelectasis. Heart is normal in size. Again seen thoracic aortic atherosclerosis. No pulmonary edema. No pleural fluid. IMPRESSION: 1. Tip of the left central line in the mid SVC.  No pneumothorax. 2. Endotracheal and enteric tubes remain in place. 3. Mild left basilar atelectasis. Electronically Signed   By: Jeb Levering M.D.   On: 10/04/2016 01:56   STUDIES:  Head Ct 10/8 >> no acute bleed or CVA  CULTURES:  ANTIBIOTICS:  SIGNIFICANT EVENTS: Cardiac arrest after AV graft bleed 10/8 STEMI 10/8 > to cath lab >   LINES/TUBES: LUE AV graft x several years ETT 10/8 >>  L  IJ TLC 10/8>>> R femoral HD catheter 10/8>>> R radial a-line 10/8>>>  DISCUSSION: 60 year old female with ESRD on HD who was seen for issues with LUE fistula who called EMS due to heavy bleeding from the fistula.  Was brought to the ED where she had it sewn over but then had suffered a cardiac arrest.  Was taken to the cath lab and RCA was stenting.  Currently on heparin drip after being given tranexamic acid to control bleeding.  Total downtime of 10 minutes.  Hypothermia protcol.  ASSESSMENT / PLAN:  PULMONARY A: Acute respiratory failure  with hypoxemia, due to cardiac arrest P:   PRVC 8cc/kg VAP prevention orders Accept pH of 7.27 for now (CO2 29, would like to avoid vasospasm) until CRRT is fully running to address metabolic acidosis.  CARDIOVASCULAR A:  PEA arrest, presumed hemorrhagic due to acute blood loss anemia STEMI due either to arrest / hypotension + possible contribution tranexamic acid Baseline HTN, hyperlipidemia P:  Levophed down to 45. Vasopressin at 0.03. CVP 1 - 2, will give a liter bolus. Heparin drip. Continue hypothermia protocol for now, if refractory shock may have to abort. Statin, other cardiac meds as per Cardiology plans.  Hold beta blockers for shock.  RENAL A:   ESRD Bleeding AV graft, repaired P:   Appreciate Dr Oneida Alar' help  HD catheter in place, CRRT started (continue to clot) Replace electrolytes as indicated CRRT even NS 1 L bolus given hypotension, pressors and CVP of 1.  GASTROINTESTINAL A:   SUP, hx GERD P:   protonix per tube. Consult nutrition for TF as per nutrition.  HEMATOLOGIC A:   Acute blood loss anemia P:  Follow serial CBC. Transfusion goal 8.0 given cardiac event, active blood loss.  INFECTIOUS A:   No evidence active infxn P:   Follow clinically off abx  ENDOCRINE A:   DM   P:   SSI per ICU protocol  NEUROLOGIC A:   Encephalopathy  Sedation for MV Therapeutic hypothermia P:   RASS goal:  -5 Fentanyl Versed Cisatracurium  EEG being done Assess MS once re-warmed  FAMILY  - Updates: No family bedside 10/09, son coming in later on today.  - Inter-disciplinary family meet or Palliative Care meeting due by: 10/14  The patient is critically ill with multiple organ systems failure and requires high complexity decision making for assessment and support, frequent evaluation and titration of therapies, application of advanced monitoring technologies and extensive interpretation of multiple databases.   Critical Care Time devoted to patient care services described in this note is  35  Minutes. This time reflects time of care of this signee Dr Jennet Maduro. This critical care time does not reflect procedure time, or teaching time or supervisory time of PA/NP/Med student/Med Resident etc but could involve care discussion time.  Rush Farmer, M.D. [facility] Pulmonary/Critical Care Medicine. Pager: [phone]. After hours pager: [phone].

## 2016-10-04 NOTE — Progress Notes (Signed)
Began rewarming process at this time (1240 10/04/2016) with Shelba Flake, RN. 13.20 hours to complete. Pt should be rewarmed around 0200 am 10/05/2016.

## 2016-10-04 NOTE — Progress Notes (Addendum)
[place] for heparin Indication: CRRT clotting despite heparin syringe  Allergies  Allergen Reactions  . Ace Inhibitors Anaphylaxis, Shortness Of Breath, Swelling and Other (See Comments)    Tongue and throat swells, cannot breathe  . Corn-Containing Products Diarrhea and Other (See Comments)    Family states pt cannot eat corn    Patient Measurements: Height: 5' 3"  (160 cm) Weight: 183 lb 13.8 oz (83.4 kg) IBW/kg (Calculated) : 52.4 Heparin Dosing Weight: 75kg  Vital Signs: Temp: 95.7 F (35.4 C) (10/09 2100) Temp Source: Core (Comment) (10/09 2100) BP: 129/86 (10/09 2000) Pulse Rate: 106 (10/09 2000)  Labs:  Recent Labs  09/29/2016 0742  10/04/2016 1219 10/23/2016 1235  10/23/2016 2050 10/25/2016 2200 10/04/16 0230  10/04/16 0624 10/04/16 1115 10/04/16 1238 10/04/16 1335 10/04/16 1359 10/04/16 1800 10/04/16 2030  HGB 8.4*  < > 13.4  --   < >  --   --  12.9  --   --  10.5*  --   --  12.2  --   --   HCT 27.7*  < > 41.5  --   < >  --   --  39.4  --   --  31.0*  --   --  36.0  --   --   PLT 188  --  254  --   --   --   --  177  --   --   --   --   --   --   --   --   APTT  --   --   --  128*  --  66*  --   --   --  58*  --   --   --   --   --   --   LABPROT 19.6*  --   --  43.7*  --  24.6*  --   --   --   --   --   --   --   --   --   --   INR 1.64  --   --  4.46*  --  2.18  --   --   --   --   --   --   --   --   --   --   HEPARINUNFRC  --   --   --   --   --   --   --   --   --   --   --  1.26* 1.68*  --   --  0.76*  CREATININE 11.27*  < >  --  11.25*  < >  --  11.32*  --   < > 10.13* 8.50*  --   --  7.70* 6.48*  --   TROPONINI  --   --   --  0.67*  --   --  3.45* 5.91*  --   --   --   --   --   --   --   --   < > = values in this interval not displayed.  Estimated Creatinine Clearance: 9.3 mL/min (by C-G formula based on SCr of 6.48 mg/dL (H)).   Medical History: Past Medical History:  Diagnosis Date  . Anemia   . Chronic  bronchitis   . Daily headache   . Degenerative joint disease    "right knee" (11/06/2015)  . Depression   . Diabetes mellitus    "only when I  take steroids" (11/06/2015)  . ESRD (end stage renal disease) on dialysis [facility])    "[place]; off [employer]; MWF" (11/06/2015)  . Family history of adverse reaction to anesthesia    "sister took a long long time to wake up"  . Fibrosis of uterus   . GERD (gastroesophageal reflux disease)   . Hx of colonic polyps 02/25/2016  . Hyperlipidemia   . Hyperparathyroidism, secondary renal ([place])   . Hypertension   . Membranoproliferative glomerulonephritis    type 1  . NEPHROTIC SYNDROME 05/07/2010   Qualifier: Diagnosis of  By: Hollie Salk CMA, [name]    . Neuromuscular disorder (HCC)    neuropathy  . Obesity   . Pneumonia ?01/2015; 06/09/2015; 11/06/2015  . Shortness of breath dyspnea     Medications:  Prescriptions Prior to Admission  Medication Sig Dispense Refill Last Dose  . albuterol (PROVENTIL HFA;VENTOLIN HFA) 108 (90 Base) MCG/ACT inhaler Inhale 1-2 puffs into the lungs every 6 (six) hours as needed for wheezing or shortness of breath. 1 Inhaler 1 unknown  . amLODipine (NORVASC) 10 MG tablet Take 10 mg by mouth daily.      Marland Kitchen gabapentin (NEURONTIN) 300 MG capsule Take 300 mg by mouth at bedtime.      . pravastatin (PRAVACHOL) 20 MG tablet Take 20 mg by mouth at bedtime. Reported on 01/27/2016   Taking  . acetaminophen (TYLENOL) 500 MG tablet Take 1,500 mg by mouth every 8 (eight) hours as needed for moderate pain or headache. Reported on 06/08/2016   Taking  . calcium acetate (PHOSLO) 667 MG capsule Take by mouth 3 (three) times daily with meals. Reported on 06/15/2016   Taking  . cinacalcet (SENSIPAR) 90 MG tablet Take 90 mg by mouth daily.   Taking  . famotidine (ACID REDUCER MAXIMUM STRENGTH) 20 MG tablet Take 20 mg by mouth as needed for heartburn or indigestion.   Taking  . fexofenadine (ALLEGRA) 180 MG tablet Take 180 mg by mouth  daily.   Taking   Scheduled:  . artificial tears  1 application Both Eyes D7A  . aspirin  81 mg Oral Daily  . atorvastatin  80 mg Oral q1800  . chlorhexidine gluconate (MEDLINE KIT)  15 mL Mouth Rinse BID  . clopidogrel  75 mg Oral Q breakfast  . [START ON 10/05/2016] feeding supplement (NEPRO CARB STEADY)  1,000 mL Per Tube Q24H  . feeding supplement (PRO-STAT SUGAR FREE 64)  60 mL Per Tube TID  . insulin aspart  1-3 Units Subcutaneous Q4H  . insulin glargine  10 Units Subcutaneous QHS  . mouth rinse  15 mL Mouth Rinse 10 times per day  . pantoprazole sodium  40 mg Per Tube Daily  . sodium chloride  1,000 mL Intravenous Once   Infusions:  . sodium chloride Stopped (10/25/2016 1220)  . cisatracurium (NIMBEX) infusion 1.5 mcg/kg/min (10/04/16 2000)  . dextrose    . fentaNYL infusion INTRAVENOUS 100 mcg/hr (10/04/16 1300)  . heparin 10,000 units/ 20 mL infusion syringe 250 Units/hr (10/04/16 2038)  . heparin 450 Units/hr (10/04/16 1600)  . insulin (NOVOLIN-R) infusion Stopped (10/05/2016 1950)  . midazolam (VERSED) infusion 5 mg/hr (10/04/16 2000)  . norepinephrine (LEVOPHED) Adult infusion 75 mcg/min (10/04/16 2000)  . dialysis replacement fluid (prismasate) 600 mL/hr at 10/04/16 1649  . dialysis replacement fluid (prismasate) 200 mL/hr at 10/04/16 0253  . dialysate (PRISMASATE) 1,800 mL/hr at 10/04/16 1813  . vasopressin (PITRESSIN) infusion - *FOR SHOCK* 0.03 Units/min (10/04/16 1300)  Assessment: 60yo female presented to ED s/p CPR after losing ~2L blood (per EMS) from fistula, rec'd TXA and fistula sewed off to stop bleeding, started mass transfusion protocol, went to cath lab for STEMI, and now on hypothermia protocol and CRRT; overnight 10/9 CRRT clotted off several times, added heparin syringe without much benefit, now to continuing on systemic heparin  -heparin level is 0.76 after hold and decrease to 450 units/hr. Rewarming still in process   Goal of Therapy:  Heparin  level 0.3-0.7 units/ml Monitor platelets by anticoagulation protocol: Yes   Plan:  -Decrease heparin to 350 units/hr -heparin level in 6 hrs  Hildred Laser, Pharm D 10/04/2016 9:22 PM

## 2016-10-04 NOTE — Procedures (Signed)
Central Venous Catheter Insertion Procedure Note KAYZLEE COURTOIS [id number] 1956-05-24  Procedure: Insertion of Central Venous Catheter Indications: HD  Procedure Details Consent: Risks of procedure as well as the alternatives and risks of each were explained to the (patient/caregiver).  Consent for procedure obtained. Time Out: Verified patient identification, verified procedure, site/side was marked, verified correct patient position, special equipment/implants available, medications/allergies/relevent history reviewed, required imaging and test results available.  Performed  Maximum sterile technique was used including antiseptics, cap, gloves, gown, hand hygiene, mask and sheet. Skin prep: Chlorhexidine; local anesthetic administered A antimicrobial bonded/coated triple lumen catheter was placed in the right femoral vein due to patient being a dialysis patient using the Seldinger technique.  Evaluation Blood flow good Complications: No apparent complications Patient did tolerate procedure well. Chest X-ray ordered to verify placement.  CXR: normal.  [name]. 10/04/2016, 12:56 AM  [place]  Lavon Paganini. Titus Mould, MD, [place] Pgr: [facility]

## 2016-10-04 NOTE — Consult Note (Signed)
Pt on vent on pressors Has femoral catheter  Will need access at some point.  Please reconsult when more stable if access needed or tunneled catheter  Ruta Hinds, MD [facility] [place]: [phone] Pager: [phone]

## 2016-10-04 NOTE — Progress Notes (Signed)
  Echocardiogram 2D Echocardiogram has been performed.  Kim Hodge 10/04/2016, 3:55 PM

## 2016-10-04 NOTE — Progress Notes (Addendum)
[place] for heparin Indication: CRRT clotting despite heparin syringe  Allergies  Allergen Reactions  . Ace Inhibitors Anaphylaxis, Shortness Of Breath, Swelling and Other (See Comments)    Tongue and throat swells, cannot breathe  . Corn-Containing Products Diarrhea and Other (See Comments)    Family states pt cannot eat corn    Patient Measurements: Height: 5' 3"  (160 cm) Weight: 183 lb 13.8 oz (83.4 kg) IBW/kg (Calculated) : 52.4 Heparin Dosing Weight: 75kg  Vital Signs: Temp: 90.7 F (32.6 C) (10/09 1300) Temp Source: Core (Comment) (10/09 1300) BP: 86/70 (10/09 1300) Pulse Rate: 84 (10/09 1142)  Labs:  Recent Labs  10/25/2016 0742  10/05/2016 1219 09/28/2016 1235  10/24/2016 1744 10/07/2016 2050 10/22/2016 2200 10/04/16 0230 10/04/16 0350 10/04/16 0624 10/04/16 1115 10/04/16 1238  HGB 8.4*  < > 13.4  --   < > 13.3  --   --  12.9  --   --  10.5*  --   HCT 27.7*  < > 41.5  --   < > 39.0  --   --  39.4  --   --  31.0*  --   PLT 188  --  254  --   --   --   --   --  177  --   --   --   --   APTT  --   --   --  128*  --   --  66*  --   --   --  58*  --   --   LABPROT 19.6*  --   --  43.7*  --   --  24.6*  --   --   --   --   --   --   INR 1.64  --   --  4.46*  --   --  2.18  --   --   --   --   --   --   HEPARINUNFRC  --   --   --   --   --   --   --   --   --   --   --   --  1.26*  CREATININE 11.27*  < >  --  11.25*  < > 11.30*  --  11.32*  --  10.25* 10.13* 8.50*  --   TROPONINI  --   --   --  0.67*  --   --   --  3.45* 5.91*  --   --   --   --   < > = values in this interval not displayed.  Estimated Creatinine Clearance: 7.1 mL/min (by C-G formula based on SCr of 8.5 mg/dL (H)).   Medical History: Past Medical History:  Diagnosis Date  . Anemia   . Chronic bronchitis   . Daily headache   . Degenerative joint disease    "right knee" (11/06/2015)  . Depression   . Diabetes mellitus    "only when I take steroids"  (11/06/2015)  . ESRD (end stage renal disease) on dialysis [facility])    "[place]; off [employer]; MWF" (11/06/2015)  . Family history of adverse reaction to anesthesia    "sister took a long long time to wake up"  . Fibrosis of uterus   . GERD (gastroesophageal reflux disease)   . Hx of colonic polyps 02/25/2016  . Hyperlipidemia   . Hyperparathyroidism, secondary renal ([place])   . Hypertension   .  Membranoproliferative glomerulonephritis    type 1  . NEPHROTIC SYNDROME 05/07/2010   Qualifier: Diagnosis of  By: Hollie Salk CMA, [name]    . Neuromuscular disorder (HCC)    neuropathy  . Obesity   . Pneumonia ?01/2015; 06/09/2015; 11/06/2015  . Shortness of breath dyspnea     Medications:  Prescriptions Prior to Admission  Medication Sig Dispense Refill Last Dose  . albuterol (PROVENTIL HFA;VENTOLIN HFA) 108 (90 Base) MCG/ACT inhaler Inhale 1-2 puffs into the lungs every 6 (six) hours as needed for wheezing or shortness of breath. 1 Inhaler 1 unknown  . amLODipine (NORVASC) 10 MG tablet Take 10 mg by mouth daily.      Marland Kitchen gabapentin (NEURONTIN) 300 MG capsule Take 300 mg by mouth at bedtime.      . pravastatin (PRAVACHOL) 20 MG tablet Take 20 mg by mouth at bedtime. Reported on 01/27/2016   Taking  . acetaminophen (TYLENOL) 500 MG tablet Take 1,500 mg by mouth every 8 (eight) hours as needed for moderate pain or headache. Reported on 06/08/2016   Taking  . calcium acetate (PHOSLO) 667 MG capsule Take by mouth 3 (three) times daily with meals. Reported on 06/15/2016   Taking  . cinacalcet (SENSIPAR) 90 MG tablet Take 90 mg by mouth daily.   Taking  . famotidine (ACID REDUCER MAXIMUM STRENGTH) 20 MG tablet Take 20 mg by mouth as needed for heartburn or indigestion.   Taking  . fexofenadine (ALLEGRA) 180 MG tablet Take 180 mg by mouth daily.   Taking   Scheduled:  . artificial tears  1 application Both Eyes O9B  . aspirin  81 mg Oral Daily  . atorvastatin  80 mg Oral q1800  .  chlorhexidine gluconate (MEDLINE KIT)  15 mL Mouth Rinse BID  . clopidogrel  75 mg Oral Q breakfast  . insulin aspart  1-3 Units Subcutaneous Q4H  . insulin glargine  10 Units Subcutaneous QHS  . mouth rinse  15 mL Mouth Rinse 10 times per day  . pantoprazole sodium  40 mg Per Tube Daily   Infusions:  . sodium chloride Stopped (10/15/2016 1220)  . cisatracurium (NIMBEX) infusion 1.5 mcg/kg/min (10/04/16 1300)  . dextrose    . fentaNYL infusion INTRAVENOUS 100 mcg/hr (10/04/16 1300)  . heparin 10,000 units/ 20 mL infusion syringe 200 Units/hr (10/04/16 1321)  . heparin 600 Units/hr (10/04/16 1300)  . insulin (NOVOLIN-R) infusion Stopped (10/06/2016 1950)  . midazolam (VERSED) infusion 5 mg/hr (10/04/16 1300)  . norepinephrine (LEVOPHED) Adult infusion 70 mcg/min (10/04/16 1321)  . dialysis replacement fluid (prismasate) 600 mL/hr at 10/04/16 1148  . dialysis replacement fluid (prismasate) 200 mL/hr at 10/04/16 0253  . dialysate (PRISMASATE) 1,800 mL/hr at 10/04/16 1318  . vasopressin (PITRESSIN) infusion - *FOR SHOCK* 0.03 Units/min (10/04/16 1300)    Assessment: 60yo female presented to ED s/p CPR after losing ~2L blood (per EMS) from fistula, rec'd TXA and fistula sewed off to stop bleeding, started mass transfusion protocol, went to cath lab for STEMI, and now on hypothermia protocol and CRRT; overnight 10/9 CRRT clotted off several times, added heparin syringe without much benefit, now to continuing on systemic heparin at 600 units/h.  Heparin level this afternoon is supratherapeutic (1.68). Per RN, heparin is infusing in R arm, level drawn off A-line. Of note, patent started re-warming process at 1240 - will likely begin clearing heparin faster. CBC stable, no bleeding or complications per RN.   Goal of Therapy:  Heparin level 0.3-0.7 units/ml Monitor platelets by  anticoagulation protocol: Yes   Plan:  Hold heparin x 1 hour; resume at lower rate 450 units/h at 1600 - communicated  with RN 4h heparin level - re-warming started ~1240 Daily heparin level/CBC Monitor for s/sx bleeding  Elicia Lamp, PharmD, BCPS Clinical Pharmacist 10/04/2016 1:37 PM

## 2016-10-04 NOTE — Progress Notes (Signed)
Pt ACT 258 and per hep syringe orders rate should be 0.63mL/hr, however CRRT machine will only go to minimum of 0.5, paged Nephrology, orders to pause for an order and recheck ACT every hour and follow heparing syringe orders.

## 2016-10-04 NOTE — Procedures (Signed)
ELECTROENCEPHALOGRAM REPORT  Date of Study: 10/04/2016  Patient's Name: Kim Hodge MRN: [id number] Date of Birth: 06/03/1956  Referring Provider: Collene Gobble, MD  Clinical History: 60 yo woman, hx tobacco, ESRD on HD (MWF) with acute heavy bleeding from the graft site. and was unresponsive with estimated 2L blood in the bed. Received CPR x estimated 10 minutes with [facility]. Subsequent ECG showed evolving ST elevations and cath lab was recommended. Pt intubated, started on MV, sedated, hypothermia protocol, CRRT.   Medications:  acetaminophen (TYLENOL) tablet 650 mg   artificial tears (LACRILUBE) ophthalmic ointment 1 application   aspirin chewable tablet 81 mg   atorvastatin (LIPITOR) tablet 80 mg   chlorhexidine gluconate (MEDLINE KIT) (PERIDEX) 0.12 % solution 15 mL  L1 cisatracurium (NIMBEX) 200 mg in sodium chloride 0.9 % 200 mL (1 mg/mL) infusion  L1 cisatracurium (NIMBEX) bolus via infusion 3.9 mg   clopidogrel (PLAVIX) tablet 75 mg   dextrose 10 % infusion   fentaNYL (SUBLIMAZE) bolus via infusion 50 mcg   fentaNYL 2571mg in NS 2553m(1031mml) infusion-PREMIX   heparin 10,000 units/ 20 mL infusion syringe   heparin ADULT infusion 100 units/mL (25000 units/250m58mdium chloride 0.45%)   heparin bolus via infusion syringe 1,000 Units   heparin injection 1,000-6,000 Units   heparinized saline (2000 units/L) primer fluid for CRRT   insulin aspart (novoLOG) injection 1-3 Units   insulin glargine (LANTUS) injection 10 Units   insulin regular (NOVOLIN R,HUMULIN R) 250 Units in sodium chloride 0.9 % 250 mL (1 Units/mL) infusion   MEDLINE mouth rinse   midazolam (VERSED) 50 mg in sodium chloride 0.9 % 50 mL (1 mg/mL) infusion   nitroGLYCERIN (NITROSTAT) SL tablet 0.4 mg   norepinephrine (LEVOPHED) 16 mg in dextrose 5 % 250 mL (0.064 mg/mL) infusion   ondansetron (ZOFRAN) injection 4 mg   pantoprazole sodium (PROTONIX) 40 mg/20 mL oral suspension 40 mg   prismasol BGK 4/2.5  5,000 mL dialysis replacement fluid   prismasol BGK 4/2.5 5,000 mL dialysis replacement fluid   prismasol BGK 4/2.5 5,000 mL dialysis solution   sodium chloride 0.9 % bolus 1,000 mL   vasopressin (PITRESSIN) 40 Units in sodium chloride 0.9 % 250 mL (0.16 Units/mL) infusion  Technical Summary: A multichannel digital EEG recording measured by the international 10-20 system with electrodes applied with paste and impedances below 5000 ohms performed in our laboratory with EKG monitoring in an intubated and sedated patient.  Hyperventilation and photic stimulation were not performed.  The digital EEG was referentially recorded, reformatted, and digitally filtered in a variety of bipolar and referential montages for optimal display.    Description: The patient is intubated, sedated and undergoing hypothermia protocol during the recording. There is loss of normal background activity.  The background is symmetric, admixed with  diffuse 4-5 Hz theta and 2-3 Hz delta slowing and no discernible posterior dominant rhythm.  Hyperventilation and photic stimulation were not performed. There were no epileptiform discharges or electrographic seizures seen.   EKG lead was unremarkable.  Impression: This intubated and sedated EEG is abnormal due to diffuse slowing of the waking background.  Clinical Correlation of the above findings indicates diffuse cerebral dysfunction that is non-specific in etiology and can be seen with hypoxic/ischemic injury, toxic/metabolic encephalopathies, neurodegenerative disorders, or medication effect.  The absence of epileptiform discharges does not rule out a clinical diagnosis of epilepsy.  Clinical correlation is advised.  AdamMetta Clines

## 2016-10-04 NOTE — Progress Notes (Signed)
Bedside EEG completed, results pending. 

## 2016-10-04 NOTE — Progress Notes (Signed)
Initial Nutrition Assessment  DOCUMENTATION CODES:   Obesity unspecified  INTERVENTION:   Nepro @ 10 ml/hr (240 ml/day) 60 ml Prostat TID Provides: 1032 kcal, 109 grams protein, and 174 ml H2O.   NUTRITION DIAGNOSIS:   Increased nutrient needs related to  (dialysis) as evidenced by estimated needs.  GOAL:   Provide needs based on ASPEN/SCCM guidelines  MONITOR:   TF tolerance, I & O's, Vent status  REASON FOR ASSESSMENT:   Consult Enteral/tube feeding initiation and management  ASSESSMENT:   60 yo woman, hx tobacco, ESRD on HD (MWF) with acute heavy bleeding from the graft site. and was unresponsive with estimated 2L blood in the bed. Received CPR x estimated 10 minutes with [facility]. Subsequent ECG showed evolving ST elevations and cath lab was recommended. Pt intubated, started on MV, sedated, hypothermia protocol, CRRT.   Patient is currently intubated on ventilator support Discussed plan with RN. Will start TF this pm once more rewarming has occurred.   Medications reviewed  Labs reviewed: K+ 5.5, PO4: 7.7, magnesium 2.6 Nutrition-Focused physical exam completed. Findings are no fat depletion, no muscle depletion, and mild edema.  OG tube, tip in antrum   Diet Order:     Skin:  Reviewed, no issues  Last BM:  unknown  Height:   Ht Readings from Last 1 Encounters:  10/04/16 5\' 3"  (1.6 m)    Weight:   Wt Readings from Last 1 Encounters:  10/04/16 183 lb 13.8 oz (83.4 kg)    Ideal Body Weight:  52.2 kg  BMI:  Body mass index is 32.57 kg/m.  Estimated Nutritional Needs:   Kcal:  [id number]  Protein:  > 105 grams   Fluid:  > 1.5 L/day  EDUCATION NEEDS:   No education needs identified at this time  [place], [place], [place] Pager [phone] After Hours Pager

## 2016-10-04 NOTE — Progress Notes (Signed)
[facility] Rounding Note  Subjective/Interval History:  On NE, vasopressin Heparin with CRRT + systemic (clotting filters) Requiring fluid boluses for low CVP (0)  Objective Vital signs in last 24 hours: Vitals:   10/04/16 0800 10/04/16 0900 10/04/16 1000 10/04/16 1100  BP:  105/81 111/60 (!) 129/99  Pulse:    82  Resp: (!) 23 (!) 24 (!) 21 (!) 24  Temp: (!) 91.8 F (33.2 C) (!) 91.4 F (33 C) (!) 90 F (32.2 C) (!) 89.6 F (32 C)  TempSrc: Core (Comment) Core (Comment) Core (Comment) Core (Comment)  SpO2: 98%   99%  Weight:      Height:       Weight change:   Intake/Output Summary (Last 24 hours) at 10/04/16 1148 Last data filed at 10/04/16 1100  Gross per 24 hour  Intake          3233.31 ml  Output              702 ml  Net          2531.31 ml   Physical Exam:  Blood pressure (!) 129/99, pulse 82, temperature (!) 89.6 F (32 C), temperature source Core (Comment), resp. rate (!) 24, height 5' 3"  (1.6 m), weight 83.4 kg (183 lb 13.8 oz), SpO2 99 %.   Gen intubated, sedated, on the vent , unresponsive Arctic sun ETT in place L IJ TCL R fem HD cath (10/8) in use for CRRT R radial a-line No jvd Chest clear  Regular S1S2 No S3 Abd soft, not distended, + BS Ext no sig LE or UE edema / no wounds or ulcers Neuro is sedated on vent LUA AVG no bruit / thrill  Labs:   Recent Labs Lab 10/02/2016 0742  09/27/2016 1235  10/24/2016 1350 09/28/2016 1546 10/19/2016 1744 10/20/2016 2200 10/04/16 0230 10/04/16 0350 10/04/16 0624 10/04/16 1115  NA 141  < > 133*  < > 140 138 137 132*  --  133* 134* 137  K 4.5  < > 4.4  < > 4.1 5.1 5.1 6.2*  --  5.9* 5.5* 4.8  CL 107  < > 108  < > 111 107 108 104  --  105 105 108  CO2 14*  --  13*  --   --   --   --  14*  --  15* 15*  --   GLUCOSE 271*  < > 249*  < > 200* 188* 158* 157*  --  116* 133* 120*  BUN 48*  < > 53*  < > 45* 52* 50* 55*  --  53* 55* 40*  CREATININE 11.27*  < > 11.25*  < > 10.70* 11.50* 11.30* 11.32*  --  10.25* 10.13* 8.50*   CALCIUM 7.5*  --  7.1*  --   --   --   --  7.3*  --  7.1* 7.2*  --   PHOS  --   --   --   --   --   --   --   --  7.7*  --   --   --   < > = values in this interval not displayed.   Recent Labs Lab 10/24/2016 0742 10/02/2016 0952 10/04/16 0230  AST 47* 152*  --   ALT 33 133*  --   ALKPHOS 81 104  --   BILITOT 0.3 0.5  --   PROT 3.5* 4.6*  --   ALBUMIN 1.7* 2.3* 2.4*     Recent  Labs Lab 10/25/2016 0742  10/19/2016 1219  10/23/2016 1546 10/12/2016 1744 10/04/16 0230 10/04/16 1115  WBC 11.9*  --  35.3*  --   --   --  29.9*  --   HGB 8.4*  < > 13.4  < > 13.9 13.3 12.9 10.5*  HCT 27.7*  < > 41.5  < > 41.0 39.0 39.4 31.0*  MCV 106.1*  --  96.5  --   --   --  94.7  --   PLT 188  --  254  --   --   --  177  --   < > = values in this interval not displayed.   Recent Labs Lab 09/30/2016 1235 10/09/2016 2200 10/04/16 0230  TROPONINI 0.67* 3.45* 5.91*     Recent Labs Lab 09/30/2016 2308 10/04/16 0117 10/04/16 0235 10/04/16 0350 10/04/16 0822  GLUCAP 148* 153* 144* 117* 137*    Studies/Results: Ct Head Wo Contrast  Result Date: 10/07/2016 CLINICAL DATA:  60 year old female with a history of altered mental status. EXAM: CT HEAD WITHOUT CONTRAST TECHNIQUE: Contiguous axial images were obtained from the base of the skull through the vertex without intravenous contrast. COMPARISON:  CT 10/01/2014 FINDINGS: Brain: No acute intracranial hemorrhage. No midline shift or mass effect. Gray-white differentiation maintained. Unremarkable appearance of the ventricular system. Vascular: Calcifications of the intracranial vasculature. Skull: No acute fracture.  No aggressive bone lesion identified. Sinuses/Orbits: Air-fluid level of the left maxillary sinus. Other: None IMPRESSION: No CT evidence of acute intracranial abnormality. These results were discussed by telephone at the time of interpretation on 09/30/2016 at 10:00 am to Dr. Gwenlyn Found, who verbally acknowledged these results. Air-fluid level of the  left maxillary sinus, potentially sinusitis. Signed, Dulcy Fanny. Earleen Newport, DO [facility] Electronically Signed   By: Corrie Mckusick D.O.   On: 09/30/2016 10:00   Dg Chest Port 1 View  Result Date: 10/04/2016 CLINICAL DATA:  Central line placement. EXAM: PORTABLE CHEST 1 VIEW COMPARISON:  Chest radiograph yesterday at 0817 hour FINDINGS: Endotracheal tube 1.7 cm from the carina. Enteric tube and probable esophageal temperature probe in place. New left internal jugular central venous catheter tip in the mid SVC. No pneumothorax. Lung volumes are low. Minimal left basilar atelectasis. Heart is normal in size. Again seen thoracic aortic atherosclerosis. No pulmonary edema. No pleural fluid. IMPRESSION: 1. Tip of the left central line in the mid SVC.  No pneumothorax. 2. Endotracheal and enteric tubes remain in place. 3. Mild left basilar atelectasis. Electronically Signed   By: Jeb Levering M.D.   On: 10/04/2016 01:56   Dg Chest Portable 1 View  Result Date: 10/16/2016 CLINICAL DATA:  Intubated. EXAM: PORTABLE CHEST 1 VIEW COMPARISON:  06/08/2016 FINDINGS: Endotracheal tube tip is 1 cm above the carina. Heart size is normal. Aortic atherosclerosis. Pulmonary vascularity is normal. Lungs are clear. No acute bone finding. IMPRESSION: Endotracheal tube tip 1 cm above the carina. Lungs clear. Vascularity normal. Aortic atherosclerosis Electronically Signed   By: Nelson Chimes M.D.   On: 10/17/2016 08:53   Dg Abd Portable 1 View  Result Date: 10/26/2016 CLINICAL DATA:  Intubated.  Nasogastric placement. EXAM: PORTABLE ABDOMEN - 1 VIEW COMPARISON:  None. FINDINGS: Nasogastric tube enters the stomach, extends to the fundus in has its tip in the body antrum junction region. Small bowel gas pattern appears unremarkable. There is an unusual angular gas collection in or projected over the left abdomen. The nature of this is uncertain. Is  there any concern  regarding perforated viscus? Additional abdominal imaging suggested if abdominal pathology is suspected. Extensive chronic degenerative changes affect the lower lumbar spine and sacroiliac joints. IMPRESSION: Nasogastric tube in the stomach. Unusual angular gas shadow in or over the left abdomen. Is there concern regarding abdominal pathology? If so, CT and/or additional radiography suggested. Electronically Signed   By: Nelson Chimes M.D.   On: 10/15/2016 08:57   EEG (10/9) "diffuse cerebral dysfunction that is non-specific in etiology and can be seen with hypoxic/ischemic injury, toxic/metabolic encephalopathies, neurodegenerative disorders, or medication effect.  The absence of epileptiform discharges does not rule out a clinical diagnosis of epilepsy"   Medications: . sodium chloride Stopped (10/18/2016 1220)  . cisatracurium (NIMBEX) infusion 1.5 mcg/kg/min (10/04/16 0800)  . dextrose    . fentaNYL infusion INTRAVENOUS 100 mcg/hr (10/04/16 0800)  . heparin 10,000 units/ 20 mL infusion syringe 300 Units/hr (10/04/16 1100)  . heparin 600 Units/hr (10/04/16 0800)  . insulin (NOVOLIN-R) infusion Stopped (10/02/2016 1950)  . midazolam (VERSED) infusion 5 mg/hr (10/04/16 0842)  . norepinephrine (LEVOPHED) Adult infusion 40 mcg/min (10/04/16 1102)  . dialysis replacement fluid (prismasate) 400 mL/hr at 10/04/16 0253  . dialysis replacement fluid (prismasate) 200 mL/hr at 10/04/16 0253  . dialysate (PRISMASATE) 1,800 mL/hr at 10/04/16 1027  . vasopressin (PITRESSIN) infusion - *FOR SHOCK* 0.03 Units/min (10/04/16 0800)   . artificial tears  1 application Both Eyes M1D  . aspirin  81 mg Oral Daily  . atorvastatin  80 mg Oral q1800  . chlorhexidine gluconate (MEDLINE KIT)  15 mL Mouth Rinse BID  . clopidogrel  75 mg Oral Q breakfast  . insulin aspart  1-3 Units Subcutaneous Q4H  . insulin glargine  10 Units Subcutaneous QHS  . mouth rinse  15 mL Mouth Rinse 10 times per day  . pantoprazole sodium   40 mg Per Tube Daily    Outpt Dialysis: MWF [facility]   3h 74mn    79kg    2/2.25 bath   P2    Hep 4000   LUA AVG Hect 2 ug tiw No ESA   Background: 60y.o. year-old with hx of HTN, tobacco use, poss COPD [facility]  Patient called 911 10/8 due to bleeding AV access. Found by EMS to be in cardiac arrest, had 3 rounds of CPR, return of respirations.  Came in w epi drip and getting O neg prbc's 2 units. Continued to bleed in ED,  and rec'd tranexamic acid, AVG oversewn by Dr. FOneida Alarat site of ulceration (and is now clotted). Developed acute STEMI c/EKG changes. Intubated in ED.  Emergent heart cath which showed 100% LCx which was stented successfully. Also had 50% L main, 75% LAD stenosis and 80% long RCA stenosis. LVEF was normal and LVEDP was low at 5.  [facility]ed on [facility] in the cath lab.  Now is in ICU sedated and chemically paralyzed. Remains on cooling protocol and on CRRT for her ESRD  Assessment/Recommendations  1. Cardiac arrest - on cooling protocol/heparin/pressors] 2. VDRF - per CCM 3. AVG bleed - AVG bleeding on arrival, rec'd tranexamic acid, ulceration oversewn by VVS (clotted now) 4. PEA arrest/STEMI - 2/2 arrest/hypotension/ABLA +/- poss effect tranxamic acid - s/p heart cath w/ /PCI to 100% LCx, also mod-severe LAD and RCA disease, LV normal. Still on pressors + hypothermia protocol. 5. Vol - low LVEDP at cath, CXR clear, no edema; under dry wt 1 kg -  getting fluid boluses for CVP of 0. Run + 100/hour with CRRT (ie keep goal positive not neg) 6. Hypotension - pressors 7. ESRD on HD , usual HD MWF. Now CRRT. Fem cath (10/9).  Keep + balance 100/hour with CVP 0. Increase pre-infusion fluids to 600/hour to try to minimize clotting. All 4K fluids.  8. Neuro - see EEG report. Outcome not clear at this time.  Jamal Maes, MD [facility] [phone] Pager 10/04/2016, 11:59 AM

## 2016-10-05 ENCOUNTER — Inpatient Hospital Stay (HOSPITAL_COMMUNITY): Payer: Medicare Other

## 2016-10-05 LAB — POCT I-STAT 3, ART BLOOD GAS (G3+)
ACID-BASE DEFICIT: 8 mmol/L — AB (ref 0.0–2.0)
BICARBONATE: 16.4 mmol/L — AB (ref 20.0–28.0)
O2 Saturation: 96 %
PCO2 ART: 29.2 mmHg — AB (ref 32.0–48.0)
PH ART: 7.355 (ref 7.350–7.450)
PO2 ART: 81 mmHg — AB (ref 83.0–108.0)
Patient temperature: 36.6
TCO2: 17 mmol/L (ref 0–100)

## 2016-10-05 LAB — GLUCOSE, CAPILLARY
GLUCOSE-CAPILLARY: 104 mg/dL — AB (ref 65–99)
GLUCOSE-CAPILLARY: 106 mg/dL — AB (ref 65–99)
GLUCOSE-CAPILLARY: 106 mg/dL — AB (ref 65–99)
GLUCOSE-CAPILLARY: 115 mg/dL — AB (ref 65–99)
GLUCOSE-CAPILLARY: 83 mg/dL (ref 65–99)
Glucose-Capillary: 117 mg/dL — ABNORMAL HIGH (ref 65–99)
Glucose-Capillary: 91 mg/dL (ref 65–99)

## 2016-10-05 LAB — CBC
HCT: 21.7 % — ABNORMAL LOW (ref 36.0–46.0)
HEMATOCRIT: 22.5 % — AB (ref 36.0–46.0)
HEMOGLOBIN: 7.7 g/dL — AB (ref 12.0–15.0)
HEMOGLOBIN: 7.6 g/dL — AB (ref 12.0–15.0)
MCH: 30.9 pg (ref 26.0–34.0)
MCH: 31.5 pg (ref 26.0–34.0)
MCHC: 34.2 g/dL (ref 30.0–36.0)
MCHC: 35 g/dL (ref 30.0–36.0)
MCV: 90.4 fL (ref 78.0–100.0)
MCV: 90 fL (ref 78.0–100.0)
PLATELETS: 121 10*3/uL — AB (ref 150–400)
Platelets: 123 10*3/uL — ABNORMAL LOW (ref 150–400)
RBC: 2.49 MIL/uL — ABNORMAL LOW (ref 3.87–5.11)
RBC: 2.41 MIL/uL — ABNORMAL LOW (ref 3.87–5.11)
RDW: 16.7 % — AB (ref 11.5–15.5)
RDW: 16.7 % — AB (ref 11.5–15.5)
WBC: 14.3 10*3/uL — ABNORMAL HIGH (ref 4.0–10.5)
WBC: 14.4 10*3/uL — ABNORMAL HIGH (ref 4.0–10.5)

## 2016-10-05 LAB — BLOOD GAS, ARTERIAL
ACID-BASE DEFICIT: 10.7 mmol/L — AB (ref 0.0–2.0)
Bicarbonate: 15 mmol/L — ABNORMAL LOW (ref 20.0–28.0)
DRAWN BY: 437071
FIO2: 40
MECHVT: 420 mL
O2 SAT: 96 %
PEEP/CPAP: 5 cmH2O
PH ART: 7.28 — AB (ref 7.350–7.450)
Patient temperature: 96
RATE: 24 resp/min
pCO2 arterial: 32.3 mmHg (ref 32.0–48.0)
pO2, Arterial: 83.4 mmHg (ref 83.0–108.0)

## 2016-10-05 LAB — RENAL FUNCTION PANEL
ALBUMIN: 2.2 g/dL — AB (ref 3.5–5.0)
ALBUMIN: 1.8 g/dL — AB (ref 3.5–5.0)
ANION GAP: 12 (ref 5–15)
ANION GAP: 13 (ref 5–15)
ANION GAP: 9 (ref 5–15)
Albumin: 1.9 g/dL — ABNORMAL LOW (ref 3.5–5.0)
BUN: 24 mg/dL — ABNORMAL HIGH (ref 6–20)
BUN: 20 mg/dL (ref 6–20)
BUN: 20 mg/dL (ref 6–20)
CALCIUM: 7.3 mg/dL — AB (ref 8.9–10.3)
CHLORIDE: 108 mmol/L (ref 101–111)
CHLORIDE: 106 mmol/L (ref 101–111)
CO2: 15 mmol/L — AB (ref 22–32)
CO2: 17 mmol/L — ABNORMAL LOW (ref 22–32)
CO2: 22 mmol/L (ref 22–32)
CREATININE: 4.88 mg/dL — AB (ref 0.44–1.00)
Calcium: 6.7 mg/dL — ABNORMAL LOW (ref 8.9–10.3)
Calcium: 8 mg/dL — ABNORMAL LOW (ref 8.9–10.3)
Chloride: 105 mmol/L (ref 101–111)
Creatinine, Ser: 4.67 mg/dL — ABNORMAL HIGH (ref 0.44–1.00)
Creatinine, Ser: 3.81 mg/dL — ABNORMAL HIGH (ref 0.44–1.00)
GFR calc Af Amer: 14 mL/min — ABNORMAL LOW (ref >60)
GFR calc non Af Amer: 9 mL/min — ABNORMAL LOW (ref >60)
GFR calc non Af Amer: 12 mL/min — ABNORMAL LOW (ref >60)
GFR, EST AFRICAN AMERICAN: 10 mL/min — AB (ref >60)
GFR, EST AFRICAN AMERICAN: 11 mL/min — AB (ref >60)
GFR, EST NON AFRICAN AMERICAN: 9 mL/min — AB (ref >60)
GLUCOSE: 105 mg/dL — AB (ref 65–99)
GLUCOSE: 94 mg/dL (ref 65–99)
Glucose, Bld: 123 mg/dL — ABNORMAL HIGH (ref 65–99)
PHOSPHORUS: 4.6 mg/dL (ref 2.5–4.6)
POTASSIUM: 5 mmol/L (ref 3.5–5.1)
POTASSIUM: 4.7 mmol/L (ref 3.5–5.1)
Phosphorus: 5 mg/dL — ABNORMAL HIGH (ref 2.5–4.6)
Phosphorus: 4.5 mg/dL (ref 2.5–4.6)
Potassium: 4.6 mmol/L (ref 3.5–5.1)
SODIUM: 135 mmol/L (ref 135–145)
Sodium: 135 mmol/L (ref 135–145)
Sodium: 137 mmol/L (ref 135–145)

## 2016-10-05 LAB — POCT ACTIVATED CLOTTING TIME
ACTIVATED CLOTTING TIME: 191 s
ACTIVATED CLOTTING TIME: 191 s
ACTIVATED CLOTTING TIME: 191 s
ACTIVATED CLOTTING TIME: 186 s
ACTIVATED CLOTTING TIME: 191 s
ACTIVATED CLOTTING TIME: 202 s
Activated Clotting Time: 197 seconds
Activated Clotting Time: 202 seconds
Activated Clotting Time: 191 seconds
Activated Clotting Time: 186 seconds
Activated Clotting Time: 186 seconds
Activated Clotting Time: 180 seconds
Activated Clotting Time: 191 seconds

## 2016-10-05 LAB — CORTISOL: CORTISOL PLASMA: 54.3 ug/dL

## 2016-10-05 LAB — HEMOGLOBIN AND HEMATOCRIT, BLOOD
HEMATOCRIT: 25 % — AB (ref 36.0–46.0)
HEMOGLOBIN: 8.6 g/dL — AB (ref 12.0–15.0)

## 2016-10-05 LAB — MAGNESIUM
MAGNESIUM: 1.8 mg/dL (ref 1.7–2.4)
Magnesium: 2 mg/dL (ref 1.7–2.4)

## 2016-10-05 LAB — HEPARIN LEVEL (UNFRACTIONATED)
HEPARIN UNFRACTIONATED: 0.85 [IU]/mL — AB (ref 0.30–0.70)
Heparin Unfractionated: 0.84 IU/mL — ABNORMAL HIGH (ref 0.30–0.70)

## 2016-10-05 LAB — PHOSPHORUS: Phosphorus: 4.4 mg/dL (ref 2.5–4.6)

## 2016-10-05 LAB — APTT

## 2016-10-05 LAB — PREPARE RBC (CROSSMATCH)

## 2016-10-05 MED ORDER — "THROMBI-PAD 3""X3"" EX PADS"
1.0000 | MEDICATED_PAD | Freq: Once | CUTANEOUS | Status: AC
  Administered 2016-10-05: 1 via TOPICAL
  Filled 2016-10-05: qty 1

## 2016-10-05 MED ORDER — SODIUM CHLORIDE 0.9% FLUSH
10.0000 mL | Freq: Two times a day (BID) | INTRAVENOUS | Status: DC
  Administered 2016-10-05: 30 mL

## 2016-10-05 MED ORDER — MIDAZOLAM HCL 2 MG/2ML IJ SOLN
1.0000 mg | INTRAMUSCULAR | Status: DC | PRN
  Administered 2016-10-07 (×2): 1 mg via INTRAVENOUS
  Administered 2016-10-08: 2 mg via INTRAVENOUS
  Filled 2016-10-05 (×2): qty 2

## 2016-10-05 MED ORDER — SODIUM CHLORIDE 0.9 % IV BOLUS (SEPSIS)
1000.0000 mL | Freq: Once | INTRAVENOUS | Status: AC
  Administered 2016-10-05: 1000 mL via INTRAVENOUS

## 2016-10-05 MED ORDER — SODIUM CHLORIDE 0.9% FLUSH: 10.0000 mL | Freq: Two times a day (BID) | INTRAVENOUS | Status: DC

## 2016-10-05 MED ORDER — SODIUM CHLORIDE 0.9% FLUSH: 10.0000 mL | INTRAVENOUS | Status: DC | PRN

## 2016-10-05 MED ORDER — SODIUM CHLORIDE 0.9 % IV SOLN
Freq: Once | INTRAVENOUS | Status: AC
  Administered 2016-10-05: 15:00:00 via INTRAVENOUS

## 2016-10-05 MED ORDER — FENTANYL CITRATE (PF) 100 MCG/2ML IJ SOLN
25.0000 ug | INTRAMUSCULAR | Status: DC | PRN
  Administered 2016-10-07 (×3): 50 ug via INTRAVENOUS
  Filled 2016-10-05 (×3): qty 2

## 2016-10-05 MED ORDER — HYDROCORTISONE NA SUCCINATE PF 100 MG IJ SOLR
50.0000 mg | Freq: Four times a day (QID) | INTRAMUSCULAR | Status: DC
  Administered 2016-10-05 – 2016-10-06 (×4): 50 mg via INTRAVENOUS
  Filled 2016-10-05 (×4): qty 2

## 2016-10-05 NOTE — Progress Notes (Signed)
[facility] called. Was told to call if brain testing took place

## 2016-10-05 NOTE — Progress Notes (Signed)
Patient Name: Kim Hodge Date of Encounter: 10/05/2016  Primary Cardiologist: [facility] Problem List     Principal Problem:   Cardiac arrest [facility]) Active Problems:   ST elevation myocardial infarction (STEMI) of inferolateral wall, initial episode of care [facility])   Presence of drug coated stent in left circumflex coronary artery   Multiple vessel coronary artery disease - diffuse severe RCA, LAD-Diag & now stented Cx following 100% occlusion   Diabetes mellitus type 2, uncontrolled, with complications ([place])   Cardiogenic shock (HCC)   Hyperlipidemia   Essential hypertension   Tobacco abuse   ESRD on dialysis ([place])   Acute blood loss anemia   Acute respiratory failure with hypoxia (HCC)   Anemia of chronic disease   Anoxic encephalopathy ([place])     Subjective   Remains intubated, Not on sedation now - essentially rewarmed. Opens eyes to voice.  Inpatient Medications    . aspirin  81 mg Oral Daily  . atorvastatin  80 mg Oral q1800  . chlorhexidine gluconate (MEDLINE KIT)  15 mL Mouth Rinse BID  . clopidogrel  75 mg Oral Q breakfast  . feeding supplement (NEPRO CARB STEADY)  1,000 mL Per Tube Q24H  . feeding supplement (PRO-STAT SUGAR FREE 64)  60 mL Per Tube TID  . hydrocortisone sodium succinate  50 mg Intravenous Q6H  . insulin aspart  1-3 Units Subcutaneous Q4H  . insulin glargine  10 Units Subcutaneous QHS  . mouth rinse  15 mL Mouth Rinse 10 times per day  . pantoprazole sodium  40 mg Per Tube Daily  . sodium chloride  1,000 mL Intravenous Once    Vital Signs    Vitals:   10/05/16 0900 10/05/16 1000 10/05/16 1100 10/05/16 1200  BP: 100/76 1_0  Pulse:    (!) 117  Resp: (!) 30 (!) 30 (!) 30 (!) 30  Temp: 97.2 F (36.2 C) 98.1 F (36.7 C) 98.1 F (36.7 C) 97.7 F (36.5 C)  TempSrc: Core (Comment) Core (Comment) Core (Comment) Core (Comment)  SpO2:    100%  Weight:      Height:        Intake/Output Summary (Last 24 hours) at  10/05/16 1259 Last data filed at 10/05/16 1200  Gross per 24 hour  Intake          4182.66 ml  Output             1157 ml  Net          3025.66 ml   Filed Weights   10/21/2016 1008 10/04/16 0610 10/05/16 0600  Weight: 78 kg (171 lb 15.3 oz) 83.4 kg (183 lb 13.8 oz) 86.5 kg (190 lb 11.2 oz)    Physical Exam   GEN: Intubated & sedated, unresponsive; Arctic sun place HEENT: Grossly normal.  ETT in place Neck: Supple, no JVD, carotid bruits, or masses. Cardiac: Tachy but RRR, distant heart sounds. No obvious murmurs, rubs, or gallops. No clubbing, cyanosis, edema.  Radials/DP/PT 2+ and equal bilaterally.  Respiratory:  Respirations regular and unlabored, clear to auscultation bilaterally. GI: Soft, nontender, nondistended, BS + x 4. MS: no deformity or atrophy. Skin: warm and dry, no rash.  Fistula without bleed Neuro:  sedated Psych: sedated  Labs    CBC  Recent Labs  10/04/16 0230  10/04/16 1359 10/05/16 1215  WBC 29.9*  --   --  14.3*  HGB 12.9  < > 12.2 7.7*  HCT 39.4  < >  36.0 22.5*  MCV 94.7  --   --  90.4  PLT 177  --   --  123*  < > = values in this interval not displayed. Basic Metabolic Panel  Recent Labs  10/04/16 0230  10/05/16 0224 10/05/16 0630  NA  --   < > 135 135  K  --   < > 4.6 5.0  CL  --   < > 108 105  CO2  --   < > 15* 17*  GLUCOSE  --   < > 123* 105*  BUN  --   < > 24* 20  CREATININE  --   < > 4.88* 4.67*  CALCIUM  --   < > 6.7* 7.3*  MG 2.6*  --  1.8  --   PHOS 7.7*  < > 5.0* 4.6  < > = values in this interval not displayed. Liver Function Tests  Recent Labs  10/13/2016 0742 10/02/2016 0952  10/05/16 0224 10/05/16 0630  AST 47* 152*  --   --   --   ALT 33 133*  --   --   --   ALKPHOS 81 104  --   --   --   BILITOT 0.3 0.5  --   --   --   PROT 3.5* 4.6*  --   --   --   ALBUMIN 1.7* 2.3*  < > 1.9* 2.2*  < > = values in this interval not displayed. No results for input(s): LIPASE, AMYLASE in the last 72 hours. Cardiac  Enzymes  Recent Labs  10/02/2016 1235 10/01/2016 2200 10/04/16 0230  TROPONINI 0.67* 3.45* 5.91*   BNP Invalid input(s): POCBNP D-Dimer No results for input(s): DDIMER in the last 72 hours. Hemoglobin A1C  Recent Labs  10/02/2016 1235  HGBA1C 5.4   Fasting Lipid Panel  Recent Labs  10/04/16 0230  CHOL 104  HDL 18*  LDLCALC 48  TRIG 189*  CHOLHDL 5.8   Thyroid Function Tests  Recent Labs  10/22/2016 1235  TSH 1.655    Telemetry    Sinus tachycardia low 100s  ECG    S Tachy 107 (not Aflutter) Persistent Inferior (subtle lateral TWI) STE  Cardiology    Diagnostic Diagram      Post-Intervention Diagram          Overlapping Synergy DES 2.25 mm x 20 & 12 EF ~50-55%   Dg Chest Port 1 View  Result Date: 10/05/2016 CLINICAL DATA:  Diabetes with hypertension.  Respiratory distress. EXAM: PORTABLE CHEST 1 VIEW COMPARISON:  10/04/2016. FINDINGS: ET tube remains low, 12 mm above carina and should be pulled back 2-3 cm. Overlying telemetry leads and pads. No infiltrates or failure. No effusion or pneumothorax. IMPRESSION: ETT too low. This should be pulled back 2-3 cm. No infiltrates or failure. Electronically Signed   By: Staci Righter M.D.   On: 10/05/2016 07:51   Dg Chest Port 1 View  Result Date: 10/04/2016 CLINICAL DATA:  Central line placement. EXAM: PORTABLE CHEST 1 VIEW COMPARISON:  Chest radiograph yesterday at 0817 hour FINDINGS: Endotracheal tube 1.7 cm from the carina. Enteric tube and probable esophageal temperature probe in place. New left internal jugular central venous catheter tip in the mid SVC. No pneumothorax. Lung volumes are low. Minimal left basilar atelectasis. Heart is normal in size. Again seen thoracic aortic atherosclerosis. No pulmonary edema. No pleural fluid. IMPRESSION: 1. Tip of the left central line in the mid SVC.  No pneumothorax. 2. Endotracheal  and enteric tubes remain in place. 3. Mild left basilar atelectasis. Electronically Signed    By: Jeb Levering M.D.   On: 10/04/2016 01:56    Patient Profile     60 y/o woman without prior Cardiac History, ESRD on HD, tobacco abuse -> called EMS b/c bleeding HD fistula --> upon EMS arrival was noted to be in cardiac arrest --> CPR initiated (significant blood loss noted).  Upon arrival to ER - EKG demonstrated Inferolateral STE (not seen PTA).  Taken for urgent cath after fistula was sutured & bleeding stopped. Not felt to be [place] Candidate due to bleed & co-morbidities.  Originally HypovolemicPossible hemorrhagic Shock was considered the etiology.  Cath revealed extensive MV CAD with occluded Cx - treated wit overlapping DES Central lines placed for pressors & HD access. Transfused PRBC for initial hemoglobin of roughly 8.5. Was given tranexamic acid in the ED for extensive bleeding -- > question if extensive clotting after this administration could potentially responsible for circumflex artery occlusion as the ST elevation did not occur until after arrival and this was administered.  Assessment & Plan    Principal Problem:   Cardiac arrest [facility]) Active Problems:   ST elevation myocardial infarction (STEMI) of inferolateral wall, initial episode of care ([place])   Presence of drug coated stent in left circumflex coronary artery   Multiple vessel coronary artery disease - diffuse severe RCA, LAD-Diag & now stented Cx following 100% occlusion   Diabetes mellitus type 2, uncontrolled, with complications (HCC)   Cardiogenic shock (HCC)   Hyperlipidemia   Essential hypertension   Tobacco abuse   ESRD on dialysis ([place])   Acute blood loss anemia   Acute respiratory failure with hypoxia (HCC)   Anemia of chronic disease   Anoxic encephalopathy ([place])  Still not clear with course of events was. She had bleeding which was significant, however after 2 units of blood her hemoglobin came back up to 12.9. Perhaps she became enough anemic with her existing disease that she suffered a  cardiac arrest. Is unclear as to the timing of when the ST elevation MI occurred, but it would appear that it may been following the treatment of bleeding with tranexamic acid.  Has been on dialysis since yesterday and has had to stop several times due to clotting probably still related to this administration of tranexamic acid  This is somewhat concerning in setting of recent coronary stent placement - now on heparin drip. Remains on [place] Sun cooling Protocol, but with Cardizem and shock is requiring significant vasopressors to maintain stable pressures. LVEDP was quite low at catheterization, suggesting volume depletion - would avoid excess fluid removal and dialysis.  From a cardiac standpoint she now has 2 drug-eluting stents in the circumflex with a severe existing disease in LAD and diffusely in the RCA.  High-dose atorvastatin, aspirin and Plavix ordered.  Remains on pressors, therefore unable to consider beta blocker. no arrhythmias noted. -   Only on low dose pressors now  Remains on IV heparin due to clotting of HD tubes.  Echo pending, but EF looked normal by cath. LVEDP is also low.  Euvolmeic on CVVHD  Hemoglobin this AM ~7.7 - would recheck, no sign of severe bleed; if repeat level is also low, would need to investigate potential bleeding source (i.e. RP bleed).  Supportive care until rewarming. Seems to be waking up.  As her presentation was not originally cardiac driven, I suspect that she was not overtly symptomatic with the  existing CAD she had. With that in mind, in light of recent bleeding issues, the potential best option for she to recover be to consider medical management of the LAD and RCA disease with thoughts to potentially evaluate as an outpatient with either a stress test or relook catheterization. She is now already on dual antiplatelet therapy.  We'll follow   Signed, Glenetta Hew, M.D., M.S. Interventional Cardiologist   Pager # [phone] Phone  # [phone] [address]. [place], [place] [address]  10/05/2016, 12:59 PM

## 2016-10-05 NOTE — Progress Notes (Signed)
Approx 10 min after Pro-Stat given pt started coughing and vomiting brown liquid. Tube feeds held and OG hooked to suction and 250cc brown liquid suctioned out. Pt given Zofran IV and she went back to sleep.

## 2016-10-05 NOTE — Progress Notes (Signed)
[place] Progress Note Patient Name: Kim Hodge DOB: [birth year] MRN: [id number]   Date of Service  10/05/2016  HPI/Events of Note  Notified that CT abdomen does not show retroperitoneal bleed but there was a small basilar L PTX. Ordering CXR now. Will assess for possible chest tube placement.   eICU Interventions       Intervention Category Intermediate Interventions: Other:  [name] S. 10/05/2016, 6:19 PM

## 2016-10-05 NOTE — Progress Notes (Signed)
25 mL of 250 mL fentanyl bag wasted in sink by two RNs, Lexi Potter and Verlin Grills, also 10 mL of 50 mL bag of versed wasted in sink by 2 RNs  Witnessed by Verlin Grills, RN

## 2016-10-05 NOTE — Progress Notes (Signed)
[place] for heparin Indication: CRRT clotting despite heparin syringe  Allergies  Allergen Reactions  . Ace Inhibitors Anaphylaxis, Shortness Of Breath, Swelling and Other (See Comments)    Tongue and throat swells, cannot breathe  . Corn-Containing Products Diarrhea and Other (See Comments)    Family states pt cannot eat corn    Patient Measurements: Height: 5\' 3"  (160 cm) Weight: 190 lb 11.2 oz (86.5 kg) (subtracted pads with water in them) IBW/kg (Calculated) : 52.4 Heparin Dosing Weight: 75kg  Vital Signs: Temp: 98.1 F (36.7 C) (10/10 1000) Temp Source: Core (Comment) (10/10 0900) BP: 106/78 (10/10 1000) Pulse Rate: 116 (10/10 0848)  Labs:  Recent Labs  10/02/2016 0742  10/02/2016 1219  10/07/2016 1235  10/16/2016 2050 10/14/2016 2200 10/04/16 0230  10/04/16 0624 10/04/16 1115  10/04/16 1359  10/04/16 2030 10/04/16 2224 10/05/16 0220 10/05/16 0224 10/05/16 0630 10/05/16 0945  HGB 8.4*  < > 13.4  --   --   < >  --   --  12.9  --   --  10.5*  --  12.2  --   --   --   --   --   --   --   HCT 27.7*  < > 41.5  --   --   < >  --   --  39.4  --   --  31.0*  --  36.0  --   --   --   --   --   --   --   PLT 188  --  254  --   --   --   --   --  177  --   --   --   --   --   --   --   --   --   --   --   --   APTT  --   --   --   < > 128*  --  66*  --   --   --  58*  --   --   --   --   --   --  >200*  --   --   --   LABPROT 19.6*  --   --   --  43.7*  --  24.6*  --   --   --   --   --   --   --   --   --   --   --   --   --   --   INR 1.64  --   --   --  4.46*  --  2.18  --   --   --   --   --   --   --   --   --   --   --   --   --   --   HEPARINUNFRC  --   --   --   --   --   --   --   --   --   --   --   --   < >  --   --  0.76*  --  0.84*  --   --  0.85*  CREATININE 11.27*  < >  --   --  11.25*  < >  --  11.32*  --   < > 10.13* 8.50*  --  7.70*  < >  --  5.84*  --  4.88* 4.67*  --   TROPONINI  --   --   --   --  0.67*  --   --  3.45*  5.91*  --   --   --   --   --   --   --   --   --   --   --   --   < > = values in this interval not displayed.  Estimated Creatinine Clearance: 13.2 mL/min (by C-G formula based on SCr of 4.67 mg/dL (H)).  Assessment: 60yo female presented to ED s/p CPR after losing ~2L blood (per EMS) from fistula, rec'd TXA and fistula sewed off to stop bleeding, started mass transfusion protocol, went to cath lab for STEMI, and now s/p hypothermia protocol and on CRRT; CRRT clotted many times during first 18 hours of CRRT added heparin syringe without much benefit so systemic heparin started.   Heparin level remains supratherapeutic (0.85) on gtt at 250 units/hr. Hypothermia protocol has been d/c'd. Per RN, patient with bleeding from A-line, thrombi-pad applied. CBC stable. Spoke with Dr. Lorrene Reid - continue systemic heparin for now.  Goal of Therapy:  Heparin level 0.3-0.7 units/ml Monitor platelets by anticoagulation protocol: Yes   Plan:  -Decrease heparin to 150 units/hr -Heparin level in 6 hrs - Renal wants to continue for now -Daily heparin level/CBC -Monitor for increased s/sx bleeding  Elicia Lamp, PharmD, BCPS Clinical Pharmacist 10/05/2016 11:00 AM

## 2016-10-05 NOTE — Progress Notes (Signed)
[place] Progress Note Patient Name: DORCA ROLLIN DOB: 1956-05-22 MRN: [id number]   Date of Service  10/05/2016  HPI/Events of Note  CVP = 5. Remains on Norepinephrine IV infusion at high dose.   eICU Interventions  Will order: 1. 0.9 NaCl 1 liter IV over 1 hour now.     Intervention Category Intermediate Interventions: Infection - evaluation and management  Lyncoln Ledgerwood Eugene 10/05/2016, 2:01 AM

## 2016-10-05 NOTE — Progress Notes (Signed)
Pt. Was transported to CT & back to 2H13 without any complications.

## 2016-10-05 NOTE — Progress Notes (Signed)
Called to check bleeding left groin site from pervious venous access. 6Fr arterial sheath present in left groin, bleeding from venous access site appeared to me to be arterial bleeding from the present 6Fr sheath. Dr. Ellyn Hack arrived as well and agreed that arterial sheath removal would be best. Heparin D/C by nurse, act performed with a result of 180. Dr. Ellyn Hack told me to removed the sheath.   Manual pressure applied for 30 minutes, Groin level 0 thrombin pad applied under dressing. Patient intubated   Bedrest begins at 14:15:00

## 2016-10-05 NOTE — Progress Notes (Signed)
[facility] Rounding Note  Subjective/Interval History:  Rewarmed yesterday Has required large volumes of fluid for low CVP's now with massaive anasarca but CVP only about 7 and still on NE and vasopressin Heparin with CRRT + systemic (clotting filters) She is responding to voice and following some commands Some issues with bleeding from her groin a-line CRRT going OK Current Rx all 4K 600/200/1800  Objective Vital signs in last 24 hours: Vitals:   10/05/16 0815 10/05/16 0848 10/05/16 0900 10/05/16 1000  BP:  135/65 100/76 106/78  Pulse:  (!) 116    Resp: 15 (!) 30 (!) 30 (!) 30  Temp:   97.2 F (36.2 C) 98.1 F (36.7 C)  TempSrc:   Core (Comment)   SpO2:  100%    Weight:      Height:       Weight change: 8.5 kg (18 lb 11.8 oz)  Intake/Output Summary (Last 24 hours) at 10/05/16 1037 Last data filed at 10/05/16 1000  Gross per 24 hour  Intake          4301.93 ml  Output             1394 ml  Net          2907.93 ml   Physical Exam:  Blood pressure 106/78, pulse (!) 116, temperature 98.1 F (36.7 C), resp. rate (!) 30, height 5' 3"  (1.6 m), weight 86.5 kg (190 lb 11.2 oz), SpO2 100 %.   Gen intubated, opens eyes, follows some commands ETT in place L IJ TCL R fem HD cath (10/8) in use for CRRT Massive anasarca, periorbital/peripheral edema CVP 6 (from 0 yesterday) Chest clear  Regular S1S2 No S3 Abd soft, not distended, + BS + edema diffusely Arctic sun pads still on  LUA AVG no bruit / thrill R femoral HD catheter (10/9)  Weight summary: 10/05/16 0600  86.5 kg (190 lb 11.2 oz)   10/04/16 0610  83.4 kg (183 lb 13.8 oz)   10/09/2016 1008  78 kg (171 lb 15.3 oz)     Labs:   Recent Labs Lab 10/06/2016 2200 10/04/16 0230 10/04/16 0350 10/04/16 0624 10/04/16 1115 10/04/16 1359 10/04/16 1800 10/04/16 2224 10/05/16 0224 10/05/16 0630  NA 132*  --  133* 134* 137 136 133* 135 135 135  K 6.2*  --  5.9* 5.5* 4.8 5.1 4.9 4.9 4.6 5.0  CL 104  --  105 105 108 107 106 106  108 105  CO2 14*  --  15* 15*  --   --  14* 16* 15* 17*  GLUCOSE 157*  --  116* 133* 120* 152* 148* 146* 123* 105*  BUN 55*  --  53* 55* 40* 38* 33* 26* 24* 20  CREATININE 11.32*  --  10.25* 10.13* 8.50* 7.70* 6.48* 5.84* 4.88* 4.67*  CALCIUM 7.3*  --  7.1* 7.2*  --   --  7.1* 7.1* 6.7* 7.3*  PHOS  --  7.7*  --   --   --   --  6.4*  --  5.0* 4.6     Recent Labs Lab 10/26/2016 0742 09/27/2016 0952  10/04/16 1800 10/05/16 0224 10/05/16 0630  AST 47* 152*  --   --   --   --   ALT 33 133*  --   --   --   --   ALKPHOS 81 104  --   --   --   --   BILITOT 0.3 0.5  --   --   --   --  PROT 3.5* 4.6*  --   --   --   --   ALBUMIN 1.7* 2.3*  < > 2.2* 1.9* 2.2*  < > = values in this interval not displayed.   Recent Labs Lab 10/24/2016 0742  10/12/2016 1219  10/20/2016 1744 10/04/16 0230 10/04/16 1115 10/04/16 1359  WBC 11.9*  --  35.3*  --   --  29.9*  --   --   HGB 8.4*  < > 13.4  < > 13.3 12.9 10.5* 12.2  HCT 27.7*  < > 41.5  < > 39.0 39.4 31.0* 36.0  MCV 106.1*  --  96.5  --   --  94.7  --   --   PLT 188  --  254  --   --  177  --   --   < > = values in this interval not displayed.   Recent Labs Lab 10/20/2016 1235 09/27/2016 2200 10/04/16 0230  TROPONINI 0.67* 3.45* 5.91*     Recent Labs Lab 10/04/16 1617 10/04/16 2031 10/04/16 2336 10/05/16 0344 10/05/16 0743  GLUCAP 139* 130* 128* 117* 106*    Studies/Results: Dg Chest Port 1 View  Result Date: 10/05/2016 CLINICAL DATA:  Diabetes with hypertension.  Respiratory distress. EXAM: PORTABLE CHEST 1 VIEW COMPARISON:  10/04/2016. FINDINGS: ET tube remains low, 12 mm above carina and should be pulled back 2-3 cm. Overlying telemetry leads and pads. No infiltrates or failure. No effusion or pneumothorax. IMPRESSION: ETT too low. This should be pulled back 2-3 cm. No infiltrates or failure. Electronically Signed   By: Staci Righter M.D.   On: 10/05/2016 07:51   Dg Chest Port 1 View  Result Date: 10/04/2016 CLINICAL DATA:   Central line placement. EXAM: PORTABLE CHEST 1 VIEW COMPARISON:  Chest radiograph yesterday at 0817 hour FINDINGS: Endotracheal tube 1.7 cm from the carina. Enteric tube and probable esophageal temperature probe in place. New left internal jugular central venous catheter tip in the mid SVC. No pneumothorax. Lung volumes are low. Minimal left basilar atelectasis. Heart is normal in size. Again seen thoracic aortic atherosclerosis. No pulmonary edema. No pleural fluid. IMPRESSION: 1. Tip of the left central line in the mid SVC.  No pneumothorax. 2. Endotracheal and enteric tubes remain in place. 3. Mild left basilar atelectasis. Electronically Signed   By: Jeb Levering M.D.   On: 10/04/2016 01:56   EEG (10/9) "diffuse cerebral dysfunction that is non-specific in etiology and can be seen with hypoxic/ischemic injury, toxic/metabolic encephalopathies, neurodegenerative disorders, or medication effect.  The absence of epileptiform discharges does not rule out a clinical diagnosis of epilepsy"   Medications: . sodium chloride Stopped (10/10/2016 1220)  . dextrose    . heparin 10,000 units/ 20 mL infusion syringe 400 Units/hr (10/05/16 0852)  . heparin 250 Units/hr (10/05/16 0800)  . insulin (NOVOLIN-R) infusion Stopped (10/26/2016 1950)  . norepinephrine (LEVOPHED) Adult infusion 6 mcg/min (10/05/16 1000)  . dialysis replacement fluid (prismasate) 600 mL/hr at 10/05/16 1000  . dialysis replacement fluid (prismasate) 200 mL/hr at 10/05/16 0849  . dialysate (PRISMASATE) 1,800 mL/hr at 10/05/16 0848  . vasopressin (PITRESSIN) infusion - *FOR SHOCK* 0.03 Units/min (10/05/16 0800)   . aspirin  81 mg Oral Daily  . atorvastatin  80 mg Oral q1800  . chlorhexidine gluconate (MEDLINE KIT)  15 mL Mouth Rinse BID  . clopidogrel  75 mg Oral Q breakfast  . feeding supplement (NEPRO CARB STEADY)  1,000 mL Per Tube Q24H  . feeding supplement (PRO-STAT  SUGAR FREE 64)  60 mL Per Tube TID  . hydrocortisone sodium  succinate  50 mg Intravenous Q6H  . insulin aspart  1-3 Units Subcutaneous Q4H  . insulin glargine  10 Units Subcutaneous QHS  . mouth rinse  15 mL Mouth Rinse 10 times per day  . pantoprazole sodium  40 mg Per Tube Daily  . sodium chloride  1,000 mL Intravenous Once  . THROMBI-PAD  1 each Topical Once    Outpt Dialysis: MWF [facility]   3h 7mn    79kg    2/2.25 bath   P2    Hep 4000   LUA AVG Hect 2 ug tiw No ESA   Background: 60y.o. year-old with hx of HTN, tobacco use, poss COPD and ESRD on HD at [facility] CM[facility].  Patient called 911 10/8 due to bleeding AV access. Found by EMS to be in cardiac arrest, had 3 rounds of CPR, return of respirations.  Came in w epi drip and getting O neg prbc's 2 units. Continued to bleed in ED,  and rec'd tranexamic acid, AVG oversewn by Dr. FOneida Alarat site of ulceration (and is now clotted). Developed acute STEMI c/EKG changes. Intubated in ED.  Emergent heart cath which showed 100% LCx which was stented successfully. Also had 50% L main, 75% LAD stenosis and 80% long RCA stenosis. LVEF was normal and LVEDP was low at 5.  Rec'd ACardinal Healthcooling protocol in the cath lab. and CRRT for her ESRD started 10/9.  Assessment/Recommendations  1. Cardiac arrest - s/p cooling protocol. Remains on pressors.  2. PEA arrest/STEMI - 2/2 arrest/hypotension/ABLA +/- poss effect tranexamic acid - s/p heart cath w/ /PCI to 100% LCx, also mod-severe LAD and RCA disease, LV normal. 2 stents in cx with severe LAD and RCA ds. Cards following. 3. VDRF - per CCM 4. AVG bleed - AVG bleeding on arrival, rec'd tranexamic acid, ulceration oversewn by VVS (clotted now) 5. Vol - low LVEDP at cath, CXR clear, no edema; initially under dry wt 1 kg - Has had massive volume resuscitation in the past 36 hours and weight now up to 86 kg (EDW 79). Hope will be able to start pulling some fluid when hemodynamically more stable. 6. Hypotension - pressors 7. ESRD on HD , usual HD MWF. Now CRRT. Fem cath  (10/9).  Change to keep even status. 600/200/1800/all 4K/keep even 8. Neuro - awake now!   CJamal Maes MD [facility] [phone] 10/05/2016, 10:37 AM

## 2016-10-05 NOTE — Progress Notes (Signed)
Hypothermia machine keeps alarming saying water temp too high- pt skin intact, per Dr. Nelda Marseille, monitor patient temp through our monitor and stop hypothermia machine and use bair hugger if needed. Will continue to monitor closely.

## 2016-10-05 NOTE — Progress Notes (Signed)
[place] for heparin Indication: CRRT clotting despite heparin syringe  Allergies  Allergen Reactions  . Ace Inhibitors Anaphylaxis, Shortness Of Breath, Swelling and Other (See Comments)    Tongue and throat swells, cannot breathe  . Corn-Containing Products Diarrhea and Other (See Comments)    Family states pt cannot eat corn    Patient Measurements: Height: 5\' 3"  (160 cm) Weight: 183 lb 13.8 oz (83.4 kg) IBW/kg (Calculated) : 52.4 Heparin Dosing Weight: 75kg  Vital Signs: Temp: 96.4 F (35.8 C) (10/10 0315) Temp Source: Core (Comment) (10/10 0315) BP: 123/74 (10/10 0300) Pulse Rate: 102 (10/10 0300)  Labs:  Recent Labs  10/11/2016 0742  10/19/2016 1219 09/27/2016 1235  10/07/2016 2050 10/19/2016 2200 10/04/16 0230  10/04/16 0624 10/04/16 1115  10/04/16 1335 10/04/16 1359 10/04/16 1800 10/04/16 2030 10/04/16 2224 10/05/16 0220 10/05/16 0224  HGB 8.4*  < > 13.4  --   < >  --   --  12.9  --   --  10.5*  --   --  12.2  --   --   --   --   --   HCT 27.7*  < > 41.5  --   < >  --   --  39.4  --   --  31.0*  --   --  36.0  --   --   --   --   --   PLT 188  --  254  --   --   --   --  177  --   --   --   --   --   --   --   --   --   --   --   APTT  --   --   --  128*  --  66*  --   --   --  58*  --   --   --   --   --   --   --   --   --   LABPROT 19.6*  --   --  43.7*  --  24.6*  --   --   --   --   --   --   --   --   --   --   --   --   --   INR 1.64  --   --  4.46*  --  2.18  --   --   --   --   --   --   --   --   --   --   --   --   --   HEPARINUNFRC  --   --   --   --   --   --   --   --   --   --   --   < > 1.68*  --   --  0.76*  --  0.84*  --   CREATININE 11.27*  < >  --  11.25*  < >  --  11.32*  --   < > 10.13* 8.50*  --   --  7.70* 6.48*  --  5.84*  --  4.88*  TROPONINI  --   --   --  0.67*  --   --  3.45* 5.91*  --   --   --   --   --   --   --   --   --   --   --   < > =  values in this interval not displayed.  Estimated  Creatinine Clearance: 12.4 mL/min (by C-G formula based on SCr of 4.88 mg/dL (H)).  Assessment: 60yo female presented to ED s/p CPR after losing ~2L blood (per EMS) from fistula, rec'd TXA and fistula sewed off to stop bleeding, started mass transfusion protocol, went to cath lab for STEMI, and now on hypothermia protocol and CRRT; CRRT clotted many times during first 18 hours of CRRT added heparin syringe without much benefit so systemic heparin started.   Heparin level remains supratherapeutic (0.84) on gtt at 350 units/hr. Pt is still rewarming.  Goal of Therapy:  Heparin level 0.3-0.7 units/ml Monitor platelets by anticoagulation protocol: Yes   Plan:  -Decrease heparin to 250 units/hr -Heparin level in 6 hrs - if still supratherapeutic, will need to d/w MD about continuing systemic heparin  Sherlon Handing, PharmD, BCPS Clinical pharmacist, pager [phone]  10/05/2016 3:20 AM

## 2016-10-05 NOTE — Progress Notes (Signed)
While in room the [place] sun machine stopped stating pads had been over therapeutic temp for hours that I needed to check patiens skin. Skin observed with no damage noted , maachine was stating that patients temp was 36.6 and just prior to machine turning off the patiens temp was 35.8 so adjustment was made to reflect last temp of 35.8 with rewarming at .3 degrees to 37.0.  Continue to monitor patient closely

## 2016-10-05 NOTE — Progress Notes (Signed)
Clinical support number called concerning that patients temp continues to decline with water temperature struggling to reach max temp. Instructed to recheck skin which I did along with the charge nurse and skin was intact with no signs of damage. Informed support person that CRRT was running and that Levophed was in the 60's mcg range. Was informed to call CCM to ask for further instructions and guide lines. Was reinforced by clinical support assistant that this was not a mechanical problem. Dr Oletta Darter was notifed of situation with orders received to give fluids and maintain MAP of 80. Will give fluids and continue to decrease IV Levophed, adding warmer to CRRT machine. Patients son is here and in the room, informed son of situation.

## 2016-10-05 NOTE — Progress Notes (Signed)
PULMONARY / CRITICAL CARE MEDICINE   Name: TESHIA LUMA MRN: [id number] DOB: Jul 16, 1956    ADMISSION DATE:  10/18/2016  REFERRING MD:  Dr Regenia Skeeter, ED  CHIEF COMPLAINT:  PEA arrest  HISTORY OF PRESENT ILLNESS:   60 yo woman, hx tobacco, ESRD on HD (MWF), DM. Has been seen for thinning above her LUE graft, some difficulty getting hemostasis after HD, thrombectomy after it clotted in mid September. She called EMS am 10/8 with acute heavy bleeding from the graft site. On EMS arrival she was unresponsive with estimated 2L blood in the bed. Received CPR x estimated 10 minutes with [facility]. Intubated in the ED, reportedly had a cough and gag but no other significant neuro responses at that time. Initial Hgb 8.4 > 8.5 down from baseline 10.  She received tranexamic acid in the ED, was seen by Dr Oneida Alar w vascular who over-sewed an ulcerated area over the graft site. Noted that the graft is now clotted, not functional. Subsequent ECG showed evolving ST elevations and cath lab was recommended. She is now in the cath lab on MV, sedation initiated.   SUBJECTIVE: Clotting CRRT overnight, heparin drip started  VITAL SIGNS: BP 100/76   Pulse (!) 116   Temp 97.2 F (36.2 C) (Core (Comment))   Resp (!) 30   Ht 5\' 3"  (1.6 m)   Wt 86.5 kg (190 lb 11.2 oz) Comment: subtracted pads with water in them  SpO2 100%   BMI 33.78 kg/m   HEMODYNAMICS: CVP:  [0 mmHg-7 mmHg] 6 mmHg  VENTILATOR SETTINGS: Vent Mode: PRVC FiO2 (%):  [40 %] 40 % Set Rate:  [24 bmp-30 bmp] 30 bmp Vt Set:  [420 mL] 420 mL PEEP:  [5 cmH20] 5 cmH20 Plateau Pressure:  [16 cmH20-20 cmH20] 16 cmH20  INTAKE / OUTPUT: I/O last 3 completed shifts: In: 6463.5 [I.V.:3335.6; NG/GT:77.8; IV Piggyback:3050] Out: 1891 [Emesis/NG output:700; [id number]  PHYSICAL EXAMINATION: General:  Ill appearing woman, sedate but arousable and following command Neuro:  arousable and following commands HEENT:  ETT in place,  Cardiovascular:  Regular,  tachy, no M Lungs:  Distant but clear Abdomen:  Soft and tympanitic, + BS Musculoskeletal:  No deformities, no edema Skin:  No rash  LABS:  BMET  Recent Labs Lab 10/04/16 2224 10/05/16 0224 10/05/16 0630  NA 135 135 135  K 4.9 4.6 5.0  CL 106 108 105  CO2 16* 15* 17*  BUN 26* 24* 20  CREATININE 5.84* 4.88* 4.67*  GLUCOSE 146* 123* 105*    Electrolytes  Recent Labs Lab 10/22/2016 0952  10/04/16 0230  10/04/16 1800 10/04/16 2224 10/05/16 0224 10/05/16 0630  CALCIUM  --   < >  --   < > 7.1* 7.1* 6.7* 7.3*  MG 2.4  --  2.6*  --   --   --  1.8  --   PHOS  --   < > 7.7*  --  6.4*  --  5.0* 4.6  < > = values in this interval not displayed.  CBC  Recent Labs Lab 09/29/2016 0742  10/11/2016 1219  10/04/16 0230 10/04/16 1115 10/04/16 1359  WBC 11.9*  --  35.3*  --  29.9*  --   --   HGB 8.4*  < > 13.4  < > 12.9 10.5* 12.2  HCT 27.7*  < > 41.5  < > 39.4 31.0* 36.0  PLT 188  --  254  --  177  --   --   < > =  values in this interval not displayed.  Coag's  Recent Labs Lab 10/18/2016 0742  10/19/2016 1235 09/28/2016 2050 10/04/16 0624 10/05/16 0220  APTT  --   < > 128* 66* 58* >200*  INR 1.64  --  4.46* 2.18  --   --   < > = values in this interval not displayed.  Sepsis Markers  Recent Labs Lab 10/11/2016 0856  LATICACIDVEN 5.27*    ABG  Recent Labs Lab 10/04/16 0420 10/05/16 0250 10/05/16 0837  PHART 7.267* 7.280* 7.355  PCO2ART 28.7* 32.3 29.2*  PO2ART 78.0* 83.4 81.0*    Liver Enzymes  Recent Labs Lab 10/26/2016 0742 10/01/2016 0952  10/04/16 1800 10/05/16 0224 10/05/16 0630  AST 47* 152*  --   --   --   --   ALT 33 133*  --   --   --   --   ALKPHOS 81 104  --   --   --   --   BILITOT 0.3 0.5  --   --   --   --   ALBUMIN 1.7* 2.3*  < > 2.2* 1.9* 2.2*  < > = values in this interval not displayed.  Cardiac Enzymes  Recent Labs Lab 10/05/2016 1235 10/01/2016 2200 10/04/16 0230  TROPONINI 0.67* 3.45* 5.91*    Glucose  Recent Labs Lab  10/04/16 1159 10/04/16 1617 10/04/16 2031 10/04/16 2336 10/05/16 0344 10/05/16 0743  GLUCAP 107* 139* 130* 128* 117* 106*    Imaging Dg Chest Port 1 View  Result Date: 10/05/2016 CLINICAL DATA:  Diabetes with hypertension.  Respiratory distress. EXAM: PORTABLE CHEST 1 VIEW COMPARISON:  10/04/2016. FINDINGS: ET tube remains low, 12 mm above carina and should be pulled back 2-3 cm. Overlying telemetry leads and pads. No infiltrates or failure. No effusion or pneumothorax. IMPRESSION: ETT too low. This should be pulled back 2-3 cm. No infiltrates or failure. Electronically Signed   By: Staci Righter M.D.   On: 10/05/2016 07:51   STUDIES:  Head Ct 10/8 >> no acute bleed or CVA  CULTURES:  ANTIBIOTICS:  SIGNIFICANT EVENTS: Cardiac arrest after AV graft bleed 10/8 STEMI 10/8 > to cath lab >   LINES/TUBES: LUE AV graft x several years ETT 10/8 >>  L IJ TLC 10/8>>> R femoral HD catheter 10/8>>> R radial a-line 10/8>>>  DISCUSSION: 60 year old female with ESRD on HD who was seen for issues with LUE fistula who called EMS due to heavy bleeding from the fistula.  Was brought to the ED where she had it sewn over but then had suffered a cardiac arrest.  Was taken to the cath lab and RCA was stenting.  Currently on heparin drip after being given tranexamic acid to control bleeding.  Total downtime of 10 minutes.  Hypothermia protcol.  ASSESSMENT / PLAN:  PULMONARY A: Acute respiratory failure with hypoxemia, due to cardiac arrest P:   PRVC 8cc/kg VAP prevention orders Titrate O2 for sat of 88-92%. Hold off weaning today given hemodynamics  CARDIOVASCULAR A:  PEA arrest, presumed hemorrhagic due to acute blood loss anemia STEMI due either to arrest / hypotension + possible contribution tranexamic acid Baseline HTN, hyperlipidemia P:  Levophed down to 8. Vasopressin at 0.03. CVP 7 Heparin drip. D/C hypothermia machine Statin, other cardiac meds as per Cardiology plans.   Hold beta blockers for shock.  RENAL A:   ESRD Bleeding AV graft, repaired P:   Appreciate Dr Oneida Alar' help  HD catheter in place, CRRT started (continue  to clot) Replace electrolytes as indicated CRRT even NS 1 L bolus given hypotension, pressors and CVP of 1.  GASTROINTESTINAL A:   SUP, hx GERD P:   Protonix per tube. TF per nutrition  HEMATOLOGIC A:   Acute blood loss anemia P:  Follow serial CBC. Transfusion goal 8.0 given cardiac event, active blood loss.  INFECTIOUS A:   No evidence active infxn P:   Follow clinically off abx  ENDOCRINE A:   DM   P:   SSI per ICU protocol  NEUROLOGIC A:   Encephalopathy  Sedation for MV Therapeutic hypothermia P:   RASS goal: -5 Fentanyl PRN Versed PRN Cisatracurium off EEG pending MS improved, will hold off calling neuro for now  FAMILY  - Updates: Family updated bedside 10/10  - Inter-disciplinary family meet or Palliative Care meeting due by: 10/14  The patient is critically ill with multiple organ systems failure and requires high complexity decision making for assessment and support, frequent evaluation and titration of therapies, application of advanced monitoring technologies and extensive interpretation of multiple databases.   Critical Care Time devoted to patient care services described in this note is  35  Minutes. This time reflects time of care of this signee Dr Jennet Maduro. This critical care time does not reflect procedure time, or teaching time or supervisory time of PA/NP/Med student/Med Resident etc but could involve care discussion time.  Rush Farmer, M.D. [facility] Pulmonary/Critical Care Medicine. Pager: [phone]. After hours pager: [phone].

## 2016-10-05 NOTE — Progress Notes (Signed)
No bleed on CT , called Dr. Lorrene Reid, orders to restart CRRT and keep heparin syringe, will continue to monitor closely. Radilogy see possible L pneumothorax on CT, Elink Doc notified. Orders received.

## 2016-10-05 NOTE — Progress Notes (Signed)
[place] Progress Note Patient Name: Kim Hodge DOB: 10/18/1956 MRN: [id number]   Date of Service  10/05/2016  HPI/Events of Note  ABG on 40%/PRVC 24/TV 420/P 5 = 7.28/32.8/83.4/15.  eICU Interventions  Will order: 1. Increase PRVC rate to 30. 2. ABG at 7 PM.      Intervention Category Major Interventions: Respiratory failure - evaluation and management;Acid-Base disturbance - evaluation and management  Sommer,Steven Eugene 10/05/2016, 4:46 AM

## 2016-10-05 NOTE — Progress Notes (Addendum)
Pt L femoral A-line oozing 10 am, held pressure for 20 minutes applied thrombi-pad and pressure dressing, 11 am and 12 am site level 0 no complications, this RN checked groin at 1300 and gauze was saturated, held pressure and reapplied new thrombi pad, 10 minutes later RN checked site and gauze and thrombi pad saturated, Dr. Ellyn Hack at bedside and orders to pull A line and stop heparin, Hgb 7.7, resent CBC hbg 7.6, 12.9 yesterday 10/9, Called Dr. Nelda Marseille, orders to give 1 unit PRBC then can pause CRRT while obtaining CT of abd/pelvis without contrast, also paged Dr. Lorrene Reid with Nephrology to see about heparin synringe versus Citrate. Orders to keep heparin syringe for now and follow protocol then call after CT to see changes with CRRT. Will continue to monitor closely.

## 2016-10-06 ENCOUNTER — Inpatient Hospital Stay (HOSPITAL_COMMUNITY): Payer: Medicare Other

## 2016-10-06 DIAGNOSIS — E782 Mixed hyperlipidemia: Secondary | ICD-10-CM

## 2016-10-06 LAB — RENAL FUNCTION PANEL
ANION GAP: 7 (ref 5–15)
Albumin: 1.6 g/dL — ABNORMAL LOW (ref 3.5–5.0)
Albumin: 1.7 g/dL — ABNORMAL LOW (ref 3.5–5.0)
Anion gap: 5 (ref 5–15)
BUN: 20 mg/dL (ref 6–20)
BUN: 23 mg/dL — AB (ref 6–20)
CALCIUM: 7.8 mg/dL — AB (ref 8.9–10.3)
CALCIUM: 8.5 mg/dL — AB (ref 8.9–10.3)
CO2: 24 mmol/L (ref 22–32)
CO2: 28 mmol/L (ref 22–32)
CREATININE: 2.85 mg/dL — AB (ref 0.44–1.00)
CREATININE: 2.55 mg/dL — AB (ref 0.44–1.00)
Chloride: 106 mmol/L (ref 101–111)
Chloride: 106 mmol/L (ref 101–111)
GFR calc Af Amer: 22 mL/min — ABNORMAL LOW (ref >60)
GFR calc non Af Amer: 19 mL/min — ABNORMAL LOW (ref >60)
GFR, EST AFRICAN AMERICAN: 19 mL/min — AB (ref >60)
GFR, EST NON AFRICAN AMERICAN: 17 mL/min — AB (ref >60)
GLUCOSE: 93 mg/dL (ref 65–99)
Glucose, Bld: 82 mg/dL (ref 65–99)
PHOSPHORUS: 3.3 mg/dL (ref 2.5–4.6)
Phosphorus: 3.9 mg/dL (ref 2.5–4.6)
Potassium: 4.2 mmol/L (ref 3.5–5.1)
Potassium: 4.5 mmol/L (ref 3.5–5.1)
SODIUM: 137 mmol/L (ref 135–145)
SODIUM: 139 mmol/L (ref 135–145)

## 2016-10-06 LAB — POCT ACTIVATED CLOTTING TIME
ACTIVATED CLOTTING TIME: 180 s
ACTIVATED CLOTTING TIME: 180 s
ACTIVATED CLOTTING TIME: 186 s
ACTIVATED CLOTTING TIME: 186 s
ACTIVATED CLOTTING TIME: 186 s
ACTIVATED CLOTTING TIME: 186 s
ACTIVATED CLOTTING TIME: 186 s
ACTIVATED CLOTTING TIME: 191 s
ACTIVATED CLOTTING TIME: 191 s
ACTIVATED CLOTTING TIME: 197 s
ACTIVATED CLOTTING TIME: 197 s
ACTIVATED CLOTTING TIME: 197 s
ACTIVATED CLOTTING TIME: 197 s
Activated Clotting Time: 186 seconds
Activated Clotting Time: 175 seconds
Activated Clotting Time: 197 seconds
Activated Clotting Time: 186 seconds

## 2016-10-06 LAB — PREPARE RBC (CROSSMATCH)

## 2016-10-06 LAB — GLUCOSE, CAPILLARY
GLUCOSE-CAPILLARY: 91 mg/dL (ref 65–99)
GLUCOSE-CAPILLARY: 104 mg/dL — AB (ref 65–99)
GLUCOSE-CAPILLARY: 90 mg/dL (ref 65–99)
GLUCOSE-CAPILLARY: 99 mg/dL (ref 65–99)
Glucose-Capillary: 86 mg/dL (ref 65–99)

## 2016-10-06 LAB — POCT I-STAT EG7
ACID-BASE EXCESS: 1 mmol/L (ref 0.0–2.0)
BICARBONATE: 26.6 mmol/L (ref 20.0–28.0)
CALCIUM ION: 1.24 mmol/L (ref 1.15–1.40)
HCT: 22 % — ABNORMAL LOW (ref 36.0–46.0)
Hemoglobin: 7.5 g/dL — ABNORMAL LOW (ref 12.0–15.0)
O2 Saturation: 62 %
PCO2 VEN: 44.5 mmHg (ref 44.0–60.0)
PO2 VEN: 33 mmHg (ref 32.0–45.0)
Potassium: 4.3 mmol/L (ref 3.5–5.1)
Sodium: 139 mmol/L (ref 135–145)
TCO2: 28 mmol/L (ref 0–100)
pH, Ven: 7.385 (ref 7.250–7.430)

## 2016-10-06 LAB — BASIC METABOLIC PANEL
Anion gap: 8 (ref 5–15)
BUN: 21 mg/dL — AB (ref 6–20)
CALCIUM: 7.8 mg/dL — AB (ref 8.9–10.3)
CO2: 22 mmol/L (ref 22–32)
CREATININE: 2.84 mg/dL — AB (ref 0.44–1.00)
Chloride: 106 mmol/L (ref 101–111)
GFR, EST AFRICAN AMERICAN: 20 mL/min — AB (ref >60)
GFR, EST NON AFRICAN AMERICAN: 17 mL/min — AB (ref >60)
Glucose, Bld: 79 mg/dL (ref 65–99)
Potassium: 4.2 mmol/L (ref 3.5–5.1)
SODIUM: 136 mmol/L (ref 135–145)

## 2016-10-06 LAB — CBC
HCT: 21.5 % — ABNORMAL LOW (ref 36.0–46.0)
HEMATOCRIT: 23.9 % — AB (ref 36.0–46.0)
HEMOGLOBIN: 7.5 g/dL — AB (ref 12.0–15.0)
HEMOGLOBIN: 8.2 g/dL — AB (ref 12.0–15.0)
MCH: 31.3 pg (ref 26.0–34.0)
MCH: 30.8 pg (ref 26.0–34.0)
MCHC: 34.9 g/dL (ref 30.0–36.0)
MCHC: 34.3 g/dL (ref 30.0–36.0)
MCV: 89.6 fL (ref 78.0–100.0)
MCV: 89.8 fL (ref 78.0–100.0)
PLATELETS: 99 10*3/uL — AB (ref 150–400)
Platelets: 97 10*3/uL — ABNORMAL LOW (ref 150–400)
RBC: 2.4 MIL/uL — ABNORMAL LOW (ref 3.87–5.11)
RBC: 2.66 MIL/uL — AB (ref 3.87–5.11)
RDW: 16 % — AB (ref 11.5–15.5)
RDW: 15.9 % — ABNORMAL HIGH (ref 11.5–15.5)
WBC: 11.5 10*3/uL — ABNORMAL HIGH (ref 4.0–10.5)
WBC: 11.6 10*3/uL — ABNORMAL HIGH (ref 4.0–10.5)

## 2016-10-06 LAB — BLOOD GAS, ARTERIAL
Acid-Base Excess: 1.4 mmol/L (ref 0.0–2.0)
BICARBONATE: 24.6 mmol/L (ref 20.0–28.0)
Drawn by: 437071
FIO2: 40
LHR: 30 {breaths}/min
MECHVT: 420 mL
O2 Saturation: 97.7 %
PATIENT TEMPERATURE: 97.4
PEEP/CPAP: 5 cmH2O
PO2 ART: 95.4 mmHg (ref 83.0–108.0)
pCO2 arterial: 31.6 mmHg — ABNORMAL LOW (ref 32.0–48.0)
pH, Arterial: 7.499 — ABNORMAL HIGH (ref 7.350–7.450)

## 2016-10-06 LAB — MAGNESIUM
MAGNESIUM: 2.1 mg/dL (ref 1.7–2.4)
Magnesium: 2.5 mg/dL — ABNORMAL HIGH (ref 1.7–2.4)

## 2016-10-06 LAB — PHOSPHORUS: PHOSPHORUS: 3.2 mg/dL (ref 2.5–4.6)

## 2016-10-06 LAB — APTT

## 2016-10-06 MED ORDER — SODIUM CHLORIDE 0.9 % IV SOLN
Freq: Once | INTRAVENOUS | Status: AC
  Administered 2016-10-06: 06:00:00 via INTRAVENOUS

## 2016-10-06 MED ORDER — CARVEDILOL 3.125 MG PO TABS
3.1250 mg | ORAL_TABLET | Freq: Two times a day (BID) | ORAL | Status: DC
  Administered 2016-10-06 (×2): 3.125 mg via ORAL
  Filled 2016-10-06 (×3): qty 1

## 2016-10-06 NOTE — Progress Notes (Signed)
PULMONARY / CRITICAL CARE MEDICINE   Name: Kim Hodge MRN: [id number] DOB: 1956/05/23    ADMISSION DATE:  10/07/2016  REFERRING MD:  Dr Regenia Skeeter, ED  CHIEF COMPLAINT:  PEA arrest  HISTORY OF PRESENT ILLNESS:   60 yo woman, hx tobacco, ESRD on HD (MWF), DM. Has been seen for thinning above her LUE graft, some difficulty getting hemostasis after HD, thrombectomy after it clotted in mid September. She called EMS am 10/8 with acute heavy bleeding from the graft site. On EMS arrival she was unresponsive with estimated 2L blood in the bed. Received CPR x estimated 10 minutes with [facility]. Intubated in the ED, reportedly had a cough and gag but no other significant neuro responses at that time. Initial Hgb 8.4 > 8.5 down from baseline 10.  She received tranexamic acid in the ED, was seen by Dr Oneida Alar w vascular who over-sewed an ulcerated area over the graft site. Noted that the graft is now clotted, not functional. Subsequent ECG showed evolving ST elevations and cath lab was recommended. She is now in the cath lab on MV, sedation initiated.   SUBJECTIVE: No events overnight, weaning  VITAL SIGNS: BP 127/69   Pulse (!) 112   Temp 98.2 F (36.8 C)   Resp (!) 25   Ht 5\' 3"  (1.6 m)   Wt 89.2 kg (196 lb 10.4 oz)   SpO2 93%   BMI 34.84 kg/m   HEMODYNAMICS: CVP:  [2 mmHg-12 mmHg] 9 mmHg  VENTILATOR SETTINGS: Vent Mode: PRVC FiO2 (%):  [40 %] 40 % Set Rate:  [24 bmp-30 bmp] 24 bmp Vt Set:  [420 mL] 420 mL PEEP:  [5 cmH20] 5 cmH20 Plateau Pressure:  [17 cmH20-20 cmH20] 17 cmH20  INTAKE / OUTPUT: I/O last 3 completed shifts: In: 4457.9 [I.V.:1319.2; Blood:500; NG/GT:588.7; IV [id number] Out: [id number] [Emesis/NG output:250; Other:919]  PHYSICAL EXAMINATION: General:  Ill appearing woman, arousable and following some commands Neuro:  arousable and following commands HEENT:  ETT in place, [place]/AT, PERRL, EOM-I and MMM Cardiovascular:  Regular, no M/R/G, Nl S1/S2 Lungs:  Distant but  clear Abdomen:  Soft and tympanitic, + BS Musculoskeletal:  No deformities, no edema Skin:  No rash  LABS:  BMET  Recent Labs Lab 10/05/16 0630 10/05/16 1853 10/06/16 0427  NA 135 137 136  137  K 5.0 4.7 4.2  4.2  CL 105 106 106  106  CO2 17* 22 22  24   BUN 20 20 21*  20  CREATININE 4.67* 3.81* 2.84*  2.85*  GLUCOSE 105* 94 79  82   Electrolytes  Recent Labs Lab 10/05/16 0224 10/05/16 0630 10/05/16 1830 10/05/16 1853 10/06/16 0427  CALCIUM 6.7* 7.3*  --  8.0* 7.8*  7.8*  MG 1.8  --  2.0  --  2.1  PHOS 5.0* 4.6 4.4 4.5 3.2  3.3   CBC  Recent Labs Lab 10/05/16 1215 10/05/16 1300 10/05/16 1845 10/06/16 0427  WBC 14.3* 14.4*  --  11.5*  HGB 7.7* 7.6* 8.6* 7.5*  HCT 22.5* 21.7* 25.0* 21.5*  PLT 123* 121*  --  99*   Coag'Kim  Recent Labs Lab 10/06/2016 0742  10/07/2016 1235 09/28/2016 2050 10/04/16 0624 10/05/16 0220 10/06/16 0427  APTT  --   < > 128* 66* 58* >200* >200*  INR 1.64  --  4.46* 2.18  --   --   --   < > = values in this interval not displayed.  Sepsis Markers  Recent Labs Lab 10/05/2016 0856  LATICACIDVEN 5.27*   ABG  Recent Labs Lab 10/05/16 0250 10/05/16 0837 10/06/16 0340  PHART 7.280* 7.355 7.499*  PCO2ART 32.3 29.2* 31.6*  PO2ART 83.4 81.0* 95.4   Liver Enzymes  Recent Labs Lab 10/12/2016 0742 09/27/2016 0952  10/05/16 0630 10/05/16 1853 10/06/16 0427  AST 47* 152*  --   --   --   --   ALT 33 133*  --   --   --   --   ALKPHOS 81 104  --   --   --   --   BILITOT 0.3 0.5  --   --   --   --   ALBUMIN 1.7* 2.3*  < > 2.2* 1.8* 1.6*  < > = values in this interval not displayed.  Cardiac Enzymes  Recent Labs Lab 10/25/2016 1235 10/10/2016 2200 10/04/16 0230  TROPONINI 0.67* 3.45* 5.91*   Glucose  Recent Labs Lab 10/05/16 1656 10/05/16 2022 10/05/16 2149 10/05/16 2354 10/06/16 0324 10/06/16 0756  GLUCAP 91 83 104* 106* 86 91   Imaging Ct Abdomen Pelvis Wo Contrast  Addendum Date: 10/05/2016   ADDENDUM  REPORT: 10/05/2016 18:20 ADDENDUM: These results were called by telephone at the time of interpretation on 10/05/2016 at 6:19 pm to Dr. Lamonte Sakai, who verbally acknowledged these results. Electronically Signed   By: Franchot Gallo M.D.   On: 10/05/2016 18:20   Result Date: 10/05/2016 CLINICAL DATA:  Cardiac catheterization 10/26/2016. Rule out retroperitoneal hemorrhage. Drop in hemoglobin. EXAM: CT ABDOMEN AND PELVIS WITHOUT CONTRAST TECHNIQUE: Multidetector CT imaging of the abdomen and pelvis was performed following the standard protocol without IV contrast. COMPARISON:  None. FINDINGS: Lower chest: Small anterior pneumothorax on the left. This is not identified on chest x-ray this morning. Repeat chest x-ray recommended. Mild bibasilar atelectasis. Diffuse coronary calcification. Hepatobiliary: Contrast filled gallbladder due to recent catheterization. Gallbladder wall non thickened. No biliary dilatation. No focal liver lesion. Pancreas: Negative Spleen: Negative Adrenals/Urinary Tract: Negative for renal obstruction or mass. No urinary tract calculi. Urinary bladder empty containing some gas possibly from recent catheterization. Stomach/Bowel: NG tube in the stomach. Stomach and duodenum normal. No bowel obstruction or mass. Diverticulosis in the left colon without diverticulitis. Normal appendix. Vascular/Lymphatic: Atherosclerotic abdominal aorta without aneurysm. Atherosclerotic disease in the iliac arteries. Soft tissue stranding in the left groin related to small hematoma from recent catheterization. No retroperitoneal hematoma Right femoral venous catheter tip in the right common iliac vein. Reproductive: Hysterectomy.  No pelvic mass. Other: No free-fluid Musculoskeletal: Midline ventral hernia containing fat. No acute skeletal abnormality. IMPRESSION: Small left-sided pneumothorax. Small left groin hematoma from recent cardiac catheterization. Negative for retroperitoneal hematoma. Electronically  Signed: By: Franchot Gallo M.D. On: 10/05/2016 18:14   Dg Chest Port 1 View  Result Date: 10/06/2016 CLINICAL DATA:  Intubated patient, respiratory failure, end-stage renal disease EXAM: PORTABLE CHEST 1 VIEW COMPARISON:  Portable chest x-ray of October 05, 2016 FINDINGS: The lungs are well-expanded. There is left lower lobe interstitial density and to a lesser extent right lower lobe interstitial density. There is no alveolar infiltrate or pleural effusion or pneumothorax. The heart and pulmonary vascularity are normal. There is calcification in the wall of the aortic arch. The endotracheal tube tip lies approximately 2.4 cm above the carina. The esophagogastric tube tip in proximal port lie below the GE junction. The left internal jugular venous catheter tip projects over the midportion of the SVC. IMPRESSION: Bibasilar subsegmental atelectasis.  No alveolar pneumonia nor CHF. The support tubes are  in reasonable position. Aortic atherosclerosis. Electronically Signed   By: David  Martinique M.D.   On: 10/06/2016 07:58   Dg Chest Port 1 View  Result Date: 10/05/2016 CLINICAL DATA:  Left pneumothorax on CT abdomen today EXAM: PORTABLE CHEST 1 VIEW COMPARISON:  10/05/2016 chest x-ray and CT abdomen from today. FINDINGS: Endotracheal tube remains low, 1 cm above the carina. Gastric tube enters the stomach. Left jugular central venous catheter tip in the lower SVC. No pneumothorax on the chest x-ray. Definite pneumothorax in the left anterior lung base on CT abdomen from today. No effusion. Negative for edema. Mild bibasilar atelectasis. IMPRESSION: Negative for pneumothorax on chest x-ray. The small pneumothorax on the CT abdomen likely does not extend into the lung apex. Endotracheal tube remains low, 1 cm above the carina. Mild bibasilar atelectasis. Electronically Signed   By: Franchot Gallo M.D.   On: 10/05/2016 19:35   STUDIES:  Head Ct 10/8 >> no acute bleed or  CVA  CULTURES:   ANTIBIOTICS:   SIGNIFICANT EVENTS: Cardiac arrest after AV graft bleed 10/8 STEMI 10/8 > to cath lab >   LINES/TUBES: LUE AV graft x several years ETT 10/8>>> L IJ TLC 10/8>>> R femoral HD catheter 10/8>>>10/10 R radial a-line 10/8>>>10/10  DISCUSSION: 60 year old female with ESRD on HD who was seen for issues with LUE fistula who called EMS due to heavy bleeding from the fistula.  Was brought to the ED where she had it sewn over but then had suffered a cardiac arrest.  Was taken to the cath lab and RCA was stenting.  Currently on heparin drip after being given tranexamic acid to control bleeding.  Total downtime of 10 minutes.  Hypothermia protcol.  ASSESSMENT / PLAN:  PULMONARY A: Acute respiratory failure with hypoxemia, due to cardiac arrest PTX on the left, small and stable P:   PRVC 8cc/kg VAP prevention orders Titrate O2 for sat of 88-92%. Begin PS trials, no extubation for now given mental status No chest tube for PTX anticipate was during arrest and has been stable on positive pressure since  CARDIOVASCULAR A:  PEA arrest, presumed hemorrhagic due to acute blood loss anemia STEMI due either to arrest / hypotension + possible contribution tranexamic acid Baseline HTN, hyperlipidemia P:  Levophed off. Vasopressin off. CVP 9 Heparin drip off. Statin, other cardiac meds as per Cardiology plans.  Hold beta blockers for shock. D/C stress dose steroids  RENAL A:   ESRD Bleeding AV graft, repaired P:   Appreciate Dr Oneida Alar' help  HD catheter in place, CRRT started (continue to clot) Replace electrolytes as indicated CRRT even KVO IVF  GASTROINTESTINAL A:   SUP, hx GERD P:   Protonix per tube. TF per nutrition  HEMATOLOGIC A:   Acute blood loss anemia P:  Follow serial CBC. Transfusion goal 8.0 given cardiac event, active blood loss.  INFECTIOUS A:   No evidence active infxn P:   Follow clinically off abx  ENDOCRINE A:    DM   P:   SSI per ICU protocol  NEUROLOGIC A:   Encephalopathy  Sedation for MV Therapeutic hypothermia P:   RASS goal: 0 Fentanyl PRN Versed PRN Cisatracurium off EEG diffuse slowing MS improved, will hold off calling neuro for now  FAMILY  - Updates: Family updated bedside 10/11.  - Inter-disciplinary family meet or Palliative Care meeting due by: 10/14  The patient is critically ill with multiple organ systems failure and requires high complexity decision making for assessment  and support, frequent evaluation and titration of therapies, application of advanced monitoring technologies and extensive interpretation of multiple databases.   Critical Care Time devoted to patient care services described in this note is  35  Minutes. This time reflects time of care of this signee Dr Jennet Maduro. This critical care time does not reflect procedure time, or teaching time or supervisory time of PA/NP/Med student/Med Resident etc but could involve care discussion time.  Rush Farmer, M.D. [facility] Pulmonary/Critical Care Medicine. Pager: [phone]. After hours pager: [phone].

## 2016-10-06 NOTE — Progress Notes (Signed)
[place] Progress Note Patient Name: Kim Hodge DOB: 1956/10/26 MRN: [id number]   Date of Service  10/06/2016  HPI/Events of Note  Hgb=7.5  eICU Interventions  Will give one unit prbc     Intervention Category Evaluation Type: Other  Dyamon Sosinski 10/06/2016, 5:44 AM

## 2016-10-06 NOTE — Progress Notes (Signed)
[facility] Rounding Note  Subjective/Interval History:  CRRT going OK Current Rx all 4K 600/200/1800 Keeping volume even  Had CT abd/pelvis yesterday out of concern for RP hematoma - had only a small L groin hematoma but no RP bleed (also had small L PTX)  Hemodynamically more stable Vaso and norepi both off now  Currently being transfused  Objective Vital signs in last 24 hours: Vitals:   10/06/16 0600 10/06/16 0620 10/06/16 0700 10/06/16 0809  BP: 118/69 123/82 133/75   Pulse: (!) 102 (!) 102 (!) 109 99  Resp: (!) 27 (!) 30 (!) 30 (!) 30  Temp: 98.1 F (36.7 C) 98.1 F (36.7 C) 98.2 F (36.8 C) 98.4 F (36.9 C)  TempSrc:  Core (Comment)    SpO2: 100% 100% 100%   Weight:      Height:       Weight change: 2.7 kg (5 lb 15.2 oz)  Intake/Output Summary (Last 24 hours) at 10/06/16 0810 Last data filed at 10/06/16 0700  Gross per 24 hour  Intake          1283.34 ml  Output             1037 ml  Net           246.34 ml   Physical Exam:  Blood pressure 133/75, pulse 99, temperature 98.4 F (36.9 C), resp. rate (!) 30, height _0  (1.6 m), weight 89.2 kg (196 lb 10.4 oz), SpO2 100 %.   Gen intubated, opens eyes ETT in place L IJ TCL R fem HD cath (10/8) in use for CRRT Massive anasarca, periorbital/peripheral edema CVP is now around 9 Chest clear anteriorly  Regular S1S2 No S3 Abd soft, not distended, + BS + edema diffusely  LUA AVG no bruit / thrill R femoral HD catheter (10/9)  Weight summary:                10/06/16  89.2 kg   10/05/16 0600  86.5 kg    10/04/16 0610  83.4 kg    10/20/2016 1008  78 kg     Labs:   Recent Labs Lab 10/04/16 0230  10/04/16 0624  10/04/16 1359 10/04/16 1800 10/04/16 2224 10/05/16 0224 10/05/16 0630 10/05/16 1830 10/05/16 1853 10/06/16 0427  NA  --   < > 134*  < > 136 133* 135 135 135  --  137 136  137  K  --   < > 5.5*  < > 5.1 4.9 4.9 4.6 5.0  --  4.7 4.2  4.2  CL  --   < > 105  < > 107 106 106 108 105  --  106 106   106  CO2  --   < > 15*  --   --  14* 16* 15* 17*  --  _1 GLUCOSE  --   < > 133*  < > 152* 148* 146* 123* 105*  --  94 79  82  BUN  --   < > 55*  < > 38* 33* 26* 24* 20  --  20 21*  20  CREATININE  --   < > 10.13*  < > 7.70* 6.48* 5.84* 4.88* 4.67*  --  3.81* 2.84*  2.85*  CALCIUM  --   < > 7.2*  --   --  7.1* 7.1* 6.7* 7.3*  --  8.0* 7.8*  7.8*  PHOS 7.7*  --   --   --   --  6.4*  --  5.0* 4.6 4.4 4.5 3.2  3.3  < > = values in this interval not displayed.   Recent Labs Lab 10/04/2016 0742 10/15/2016 0952  10/05/16 0630 10/05/16 1853 10/06/16 0427  AST 47* 152*  --   --   --   --   ALT 33 133*  --   --   --   --   ALKPHOS 81 104  --   --   --   --   BILITOT 0.3 0.5  --   --   --   --   PROT 3.5* 4.6*  --   --   --   --   ALBUMIN 1.7* 2.3*  < > 2.2* 1.8* 1.6*  < > = values in this interval not displayed.   Recent Labs Lab 10/04/16 0230  10/05/16 1215 10/05/16 1300 10/05/16 1845 10/06/16 0427  WBC 29.9*  --  14.3* 14.4*  --  11.5*  HGB 12.9  < > 7.7* 7.6* 8.6* 7.5*  HCT 39.4  < > 22.5* 21.7* 25.0* 21.5*  MCV 94.7  --  90.4 90.0  --  89.6  PLT 177  --  123* 121*  --  99*  < > = values in this interval not displayed.   Recent Labs Lab 10/18/2016 1235 10/16/2016 2200 10/04/16 0230  TROPONINI 0.67* 3.45* 5.91*     Recent Labs Lab 10/05/16 1656 10/05/16 2022 10/05/16 2149 10/05/16 2354 10/06/16 0324  GLUCAP 91 83 104* 106* 86    Studies/Results: Ct Abdomen Pelvis Wo Contrast  Addendum Date: 10/05/2016   ADDENDUM REPORT: 10/05/2016 18:20 ADDENDUM: These results were called by telephone at the time of interpretation on 10/05/2016 at 6:19 pm to Dr. Lamonte Sakai, who verbally acknowledged these results. Electronically Signed   By: Franchot Gallo M.D.   On: 10/05/2016 18:20   Result Date: 10/05/2016 CLINICAL DATA:  Cardiac catheterization 10/07/2016. Rule out retroperitoneal hemorrhage. Drop in hemoglobin. EXAM: CT ABDOMEN AND PELVIS WITHOUT CONTRAST TECHNIQUE:  Multidetector CT imaging of the abdomen and pelvis was performed following the standard protocol without IV contrast. COMPARISON:  None. FINDINGS: Lower chest: Small anterior pneumothorax on the left. This is not identified on chest x-ray this morning. Repeat chest x-ray recommended. Mild bibasilar atelectasis. Diffuse coronary calcification. Hepatobiliary: Contrast filled gallbladder due to recent catheterization. Gallbladder wall non thickened. No biliary dilatation. No focal liver lesion. Pancreas: Negative Spleen: Negative Adrenals/Urinary Tract: Negative for renal obstruction or mass. No urinary tract calculi. Urinary bladder empty containing some gas possibly from recent catheterization. Stomach/Bowel: NG tube in the stomach. Stomach and duodenum normal. No bowel obstruction or mass. Diverticulosis in the left colon without diverticulitis. Normal appendix. Vascular/Lymphatic: Atherosclerotic abdominal aorta without aneurysm. Atherosclerotic disease in the iliac arteries. Soft tissue stranding in the left groin related to small hematoma from recent catheterization. No retroperitoneal hematoma Right femoral venous catheter tip in the right common iliac vein. Reproductive: Hysterectomy.  No pelvic mass. Other: No free-fluid Musculoskeletal: Midline ventral hernia containing fat. No acute skeletal abnormality. IMPRESSION: Small left-sided pneumothorax. Small left groin hematoma from recent cardiac catheterization. Negative for retroperitoneal hematoma. Electronically Signed: By: Franchot Gallo M.D. On: 10/05/2016 18:14   Dg Chest Port 1 View  Result Date: 10/06/2016 CLINICAL DATA:  Intubated patient, respiratory failure, end-stage renal disease EXAM: PORTABLE CHEST 1 VIEW COMPARISON:  Portable chest x-ray of October 05, 2016 FINDINGS: The lungs are well-expanded. There is left lower lobe interstitial density and to a lesser extent right lower  lobe interstitial density. There is no alveolar infiltrate or  pleural effusion or pneumothorax. The heart and pulmonary vascularity are normal. There is calcification in the wall of the aortic arch. The endotracheal tube tip lies approximately 2.4 cm above the carina. The esophagogastric tube tip in proximal port lie below the GE junction. The left internal jugular venous catheter tip projects over the midportion of the SVC. IMPRESSION: Bibasilar subsegmental atelectasis.  No alveolar pneumonia nor CHF. The support tubes are in reasonable position. Aortic atherosclerosis. Electronically Signed   By: David  Martinique M.D.   On: 10/06/2016 07:58   Dg Chest Port 1 View  Result Date: 10/05/2016 CLINICAL DATA:  Left pneumothorax on CT abdomen today EXAM: PORTABLE CHEST 1 VIEW COMPARISON:  10/05/2016 chest x-ray and CT abdomen from today. FINDINGS: Endotracheal tube remains low, 1 cm above the carina. Gastric tube enters the stomach. Left jugular central venous catheter tip in the lower SVC. No pneumothorax on the chest x-ray. Definite pneumothorax in the left anterior lung base on CT abdomen from today. No effusion. Negative for edema. Mild bibasilar atelectasis. IMPRESSION: Negative for pneumothorax on chest x-ray. The small pneumothorax on the CT abdomen likely does not extend into the lung apex. Endotracheal tube remains low, 1 cm above the carina. Mild bibasilar atelectasis. Electronically Signed   By: Franchot Gallo M.D.   On: 10/05/2016 19:35   Dg Chest Port 1 View  Result Date: 10/05/2016 CLINICAL DATA:  Diabetes with hypertension.  Respiratory distress. EXAM: PORTABLE CHEST 1 VIEW COMPARISON:  10/04/2016. FINDINGS: ET tube remains low, 12 mm above carina and should be pulled back 2-3 cm. Overlying telemetry leads and pads. No infiltrates or failure. No effusion or pneumothorax. IMPRESSION: ETT too low. This should be pulled back 2-3 cm. No infiltrates or failure. Electronically Signed   By: Staci Righter M.D.   On: 10/05/2016 07:51   EEG (10/9) "diffuse cerebral  dysfunction that is non-specific in etiology and can be seen with hypoxic/ischemic injury, toxic/metabolic encephalopathies, neurodegenerative disorders, or medication effect.  The absence of epileptiform discharges does not rule out a clinical diagnosis of epilepsy"   Medications: . sodium chloride 10 mL/hr at 10/06/16 0040  . dextrose    . heparin 10,000 units/ 20 mL infusion syringe 850 Units/hr (10/06/16 0806)  . norepinephrine (LEVOPHED) Adult infusion Stopped (10/06/16 0218)  . dialysis replacement fluid (prismasate) 600 mL/hr at 10/06/16 0422  . dialysis replacement fluid (prismasate) 200 mL/hr at 10/05/16 0849  . dialysate (PRISMASATE) 1,800 mL/hr at 10/06/16 0435  . vasopressin (PITRESSIN) infusion - *FOR SHOCK* Stopped (10/05/16 1030)   . aspirin  81 mg Oral Daily  . atorvastatin  80 mg Oral q1800  . chlorhexidine gluconate (MEDLINE KIT)  15 mL Mouth Rinse BID  . clopidogrel  75 mg Oral Q breakfast  . feeding supplement (NEPRO CARB STEADY)  1,000 mL Per Tube Q24H  . feeding supplement (PRO-STAT SUGAR FREE 64)  60 mL Per Tube TID  . hydrocortisone sodium succinate  50 mg Intravenous Q6H  . insulin aspart  1-3 Units Subcutaneous Q4H  . insulin glargine  10 Units Subcutaneous QHS  . mouth rinse  15 mL Mouth Rinse 10 times per day  . pantoprazole sodium  40 mg Per Tube Daily  . sodium chloride  1,000 mL Intravenous Once    Outpt Dialysis: MWF [facility]   3h 61mn    79kg    2/2.25 bath   P2    Hep 4000   LUA  AVG Hect 2 ug tiw No ESA   Background: 60 y.o. year-old with hx of HTN, tobacco use, poss COPD and ESRD on HD at [facility] MWF.  Patient called 911 10/8 due to bleeding AV access. Found by EMS to be in cardiac arrest, had 3 rounds of CPR, return of respirations.  Came in w epi drip and getting O neg prbc's 2 units. In ED rec'd tranexamic acid, AVG oversewn by Dr. Oneida Alar at site of ulceration (and is now clotted). Acute STEMI c/EKG changes. Emergent heart cath showed 100% LCx which  was stented successfully. Also had 50% L main, 75% LAD stenosis and 80% long RCA stenosis. LVEF was normal and LVEDP was low at 5.  Rec'd [employer] cooling protocol in the cath lab.  CRRT for her ESRD started 10/9.  Assessment/Recommendations  1. PEA arrest/STEMI - 2/2 arrest/hypotension/ABLA +/- poss effect tranexamic acid - s/p heart cath w/ /PCI to 100% LCx, also mod-severe LAD and RCA disease, LV normal. 2 stents in cx with severe LAD and RCA ds. Cards following. S/p cooling protocol  Medical management for now 2. VDRF - per CCM 3. AVG bleed - AVG bleeding on arrival, rec'd tranexamic acid, ulceration oversewn by [facility] (clotted now) 4. Vol - low LVEDP at cath, CXR clear, no edema; initially was under dry wt 1 kg - Has had massive volume resuscitation for hypotension/low CVP's - weight now up by 11 kg. I think we will need to start trying for a negative balance now that pressors off - will wait on CCM and cards to see this AM before changing orders.  5. ESRD on HD usual HD MWF. Now CRRT. Fem cath (10/9).  K and phos fine. Currently keeping even. Now massive anasarca. Will get input from cards/CCM but I think need to start pulling some volume back off.    Jamal Maes, MD [facility] [phone] Pager 10/06/2016, 8:10 AM

## 2016-10-06 NOTE — Progress Notes (Signed)
Patient Name: Kim Hodge Date of Encounter: 10/06/2016  Primary Cardiologist: [facility] Problem List     Principal Problem:   Cardiac arrest [facility]) Active Problems:   ST elevation myocardial infarction (STEMI) of inferolateral wall, initial episode of care [facility])   Presence of drug coated stent in left circumflex coronary artery   Multiple vessel coronary artery disease - diffuse severe RCA, LAD-Diag & now stented Cx following 100% occlusion   Diabetes mellitus type 2, uncontrolled, with complications ([place])   Cardiogenic shock (HCC)   Hyperlipidemia   Essential hypertension   Tobacco abuse   ESRD on dialysis ([place])   Acute blood loss anemia   Acute respiratory failure with hypoxia (HCC)   Anemia of chronic disease   Anoxic encephalopathy (HCC)    Subjective   Yesterday's events: Hemoglobin levels showed notable drop to 7.5/7.6. Left femoral arterial line had a constant loose with small hematoma. Sheath was removed and manual pressure held.  CT scan which did not show any signs of RP bleeding.  Received 1 unit PRBC yesterday, and then 1 this morning (total 2) - repeat CBC pending  Remains intubated, Not on sedation now - rewarmed, on bear-hugger Opens eyes to voice.  Groggy for me, but per RN - slowly follows commands - wiggle toes, hand squeeze, tracking with eyes  Inpatient Medications    . aspirin  81 mg Oral Daily  . atorvastatin  80 mg Oral q1800  . chlorhexidine gluconate (MEDLINE KIT)  15 mL Mouth Rinse BID  . clopidogrel  75 mg Oral Q breakfast  . feeding supplement (NEPRO CARB STEADY)  1,000 mL Per Tube Q24H  . feeding supplement (PRO-STAT SUGAR FREE 64)  60 mL Per Tube TID  . hydrocortisone sodium succinate  50 mg Intravenous Q6H  . insulin aspart  1-3 Units Subcutaneous Q4H  . insulin glargine  10 Units Subcutaneous QHS  . mouth rinse  15 mL Mouth Rinse 10 times per day  . pantoprazole sodium  40 mg Per Tube Daily  . sodium chloride  1,000 mL  Intravenous Once    Vital Signs    Vitals:   10/06/16 0600 10/06/16 0620 10/06/16 0700 10/06/16 0809  BP: 118/69 123/82 133/75   Pulse: (!) 102 (!) 102 (!) 109 99  Resp: (!) 27 (!) 30 (!) 30 (!) 30  Temp: 98.1 F (36.7 C) 98.1 F (36.7 C) 98.2 F (36.8 C) 98.4 F (36.9 C)  TempSrc:  Core (Comment)  Core (Comment)  SpO2: 100% 100% 100%   Weight:      Height:        Intake/Output Summary (Last 24 hours) at 10/06/16 0852 Last data filed at 10/06/16 0830  Gross per 24 hour  Intake          1363.34 ml  Output             1212 ml  Net           151.34 ml   Filed Weights   10/04/16 0610 10/05/16 0600 10/06/16 0326  Weight: 83.4 kg (183 lb 13.8 oz) 86.5 kg (190 lb 11.2 oz) 89.2 kg (196 lb 10.4 oz)    Physical Exam   GEN: Intubated & sedated, unresponsive; Arctic sun place HEENT: Grossly normal.  ETT in place Neck: Supple, no JVD, carotid bruits, or masses. Cardiac: Tachy but RRR, distant heart sounds. No obvious murmurs, rubs, or gallops. No clubbing, cyanosis, edema.  Radials/DP/PT 2+ and equal bilaterally.  Respiratory:  Respirations regular and unlabored, clear to auscultation bilaterally.  - Vent rate was 30. We reduce to 10, and she did not breathe over vent. With respiratory alkalosis, rate was increased back to 20. GI: Soft, nontender, nondistended, BS + x 4. MS: no deformity or atrophy. Skin: warm and dry, no rash.  Fistula without bleed; left femoral arterial access site with minimal hematoma. Neuro:  sedated Psych: sedated  Labs    CBC  Recent Labs  10/05/16 1300 10/05/16 1845 10/06/16 0427  WBC 14.4*  --  11.5*  HGB 7.6* 8.6* 7.5*  HCT 21.7* 25.0* 21.5*  MCV 90.0  --  89.6  PLT 121*  --  99*   Basic Metabolic Panel  Recent Labs  10/05/16 1830 10/05/16 1853 10/06/16 0427  NA  --  137 136  137  K  --  4.7 4.2  4.2  CL  --  106 106  106  CO2  --  22 22  24   GLUCOSE  --  94 79  82  BUN  --  20 21*  20  CREATININE  --  3.81* 2.84*  2.85*    CALCIUM  --  8.0* 7.8*  7.8*  MG 2.0  --  2.1  PHOS 4.4 4.5 3.2  3.3   Liver Function Tests  Recent Labs  10/21/2016 0952  10/05/16 1853 10/06/16 0427  AST 152*  --   --   --   ALT 133*  --   --   --   ALKPHOS 104  --   --   --   BILITOT 0.5  --   --   --   PROT 4.6*  --   --   --   ALBUMIN 2.3*  < > 1.8* 1.6*  < > = values in this interval not displayed. No results for input(s): LIPASE, AMYLASE in the last 72 hours. Cardiac Enzymes  Recent Labs  10/12/2016 1235 10/24/2016 2200 10/04/16 0230  TROPONINI 0.67* 3.45* 5.91*   BNP Invalid input(s): POCBNP D-Dimer No results for input(s): DDIMER in the last 72 hours. Hemoglobin A1C  Recent Labs  10/02/2016 1235  HGBA1C 5.4   Fasting Lipid Panel  Recent Labs  10/04/16 0230  CHOL 104  HDL 18*  LDLCALC 48  TRIG 189*  CHOLHDL 5.8   Thyroid Function Tests  Recent Labs  09/30/2016 1235  TSH 1.655    Telemetry    Sinus tachycardia low 100s  ECG    S Tachy 107 (not Aflutter) Persistent Inferior (subtle lateral TWI) STE  Cardiology    Diagnostic Diagram      Post-Intervention Diagram           Overlapping Synergy DES 2.25 mm x 20 & 12  EF ~50-55%   2-D Echo 10/04/2016: Difficult study. Very limited - 12 function appears to be normal. Mildly reduced RV function. Calcified aortic valve, cannot assess further.   Ct Abdomen Pelvis Wo Contrast  Addendum Date: 10/05/2016   ADDENDUM REPORT: 10/05/2016 18:20 ADDENDUM: These results were called by telephone at the time of interpretation on 10/05/2016 at 6:19 pm to Dr. Lamonte Sakai, who verbally acknowledged these results. Electronically Signed   By: Franchot Gallo M.D.   On: 10/05/2016 18:20   Result Date: 10/05/2016 CLINICAL DATA:  Cardiac catheterization 10/07/2016. Rule out retroperitoneal hemorrhage. Drop in hemoglobin. EXAM: CT ABDOMEN AND PELVIS WITHOUT CONTRAST TECHNIQUE: Multidetector CT imaging of the abdomen and pelvis was performed following the standard  protocol without IV contrast. COMPARISON:  None. FINDINGS: Lower chest: Small anterior pneumothorax on the left. This is not identified on chest x-ray this morning. Repeat chest x-ray recommended. Mild bibasilar atelectasis. Diffuse coronary calcification. Hepatobiliary: Contrast filled gallbladder due to recent catheterization. Gallbladder wall non thickened. No biliary dilatation. No focal liver lesion. Pancreas: Negative Spleen: Negative Adrenals/Urinary Tract: Negative for renal obstruction or mass. No urinary tract calculi. Urinary bladder empty containing some gas possibly from recent catheterization. Stomach/Bowel: NG tube in the stomach. Stomach and duodenum normal. No bowel obstruction or mass. Diverticulosis in the left colon without diverticulitis. Normal appendix. Vascular/Lymphatic: Atherosclerotic abdominal aorta without aneurysm. Atherosclerotic disease in the iliac arteries. Soft tissue stranding in the left groin related to small hematoma from recent catheterization. No retroperitoneal hematoma Right femoral venous catheter tip in the right common iliac vein. Reproductive: Hysterectomy.  No pelvic mass. Other: No free-fluid Musculoskeletal: Midline ventral hernia containing fat. No acute skeletal abnormality. IMPRESSION: Small left-sided pneumothorax. Small left groin hematoma from recent cardiac catheterization. Negative for retroperitoneal hematoma. Electronically Signed: By: Franchot Gallo M.D. On: 10/05/2016 18:14   Dg Chest Port 1 View  Result Date: 10/06/2016 CLINICAL DATA:  Intubated patient, respiratory failure, end-stage renal disease EXAM: PORTABLE CHEST 1 VIEW COMPARISON:  Portable chest x-ray of October 05, 2016 FINDINGS: The lungs are well-expanded. There is left lower lobe interstitial density and to a lesser extent right lower lobe interstitial density. There is no alveolar infiltrate or pleural effusion or pneumothorax. The heart and pulmonary vascularity are normal. There is  calcification in the wall of the aortic arch. The endotracheal tube tip lies approximately 2.4 cm above the carina. The esophagogastric tube tip in proximal port lie below the GE junction. The left internal jugular venous catheter tip projects over the midportion of the SVC. IMPRESSION: Bibasilar subsegmental atelectasis.  No alveolar pneumonia nor CHF. The support tubes are in reasonable position. Aortic atherosclerosis. Electronically Signed   By: Linnet Bottari  [place] M.D.   On: 10/06/2016 07:58   Dg Chest Port 1 View  Result Date: 10/05/2016 CLINICAL DATA:  Left pneumothorax on CT abdomen today EXAM: PORTABLE CHEST 1 VIEW COMPARISON:  10/05/2016 chest x-ray and CT abdomen from today. FINDINGS: Endotracheal tube remains low, 1 cm above the carina. Gastric tube enters the stomach. Left jugular central venous catheter tip in the lower SVC. No pneumothorax on the chest x-ray. Definite pneumothorax in the left anterior lung base on CT abdomen from today. No effusion. Negative for edema. Mild bibasilar atelectasis. IMPRESSION: Negative for pneumothorax on chest x-ray. The small pneumothorax on the CT abdomen likely does not extend into the lung apex. Endotracheal tube remains low, 1 cm above the carina. Mild bibasilar atelectasis. Electronically Signed   By: Franchot Gallo M.D.   On: 10/05/2016 19:35   Dg Chest Port 1 View  Result Date: 10/05/2016 CLINICAL DATA:  Diabetes with hypertension.  Respiratory distress. EXAM: PORTABLE CHEST 1 VIEW COMPARISON:  10/04/2016. FINDINGS: ET tube remains low, 12 mm above carina and should be pulled back 2-3 cm. Overlying telemetry leads and pads. No infiltrates or failure. No effusion or pneumothorax. IMPRESSION: ETT too low. This should be pulled back 2-3 cm. No infiltrates or failure. Electronically Signed   By: Staci Righter M.D.   On: 10/05/2016 07:51    Patient Profile     60 y/o woman without prior Cardiac History, ESRD on HD, tobacco abuse -> called EMS b/c bleeding  HD fistula --> upon EMS arrival was noted to be in cardiac arrest -->  CPR initiated (significant blood loss noted).  Upon arrival to ER - EKG demonstrated Inferolateral STE (not seen PTA).  Taken for urgent cath after fistula was sutured & bleeding stopped. Not felt to be [place] Candidate due to bleed & co-morbidities.  Originally HypovolemicPossible hemorrhagic Shock was considered the etiology.  Cath revealed extensive MV CAD with occluded Cx - treated with 2 overlapping DES Central lines placed for pressors & HD access. Transfused PRBC for initial hemoglobin of roughly 8.5. Was given tranexamic acid in the ED for extensive bleeding -- > question if extensive clotting after this administration could potentially responsible for circumflex artery occlusion as the ST elevation did not occur until after arrival and this was administered.  Assessment & Plan    Principal Problem:   Cardiac arrest [facility]) Active Problems:   ST elevation myocardial infarction (STEMI) of inferolateral wall, initial episode of care ([place])   Presence of drug coated stent in left circumflex coronary artery   Multiple vessel coronary artery disease - diffuse severe RCA, LAD-Diag & now stented Cx following 100% occlusion   Diabetes mellitus type 2, uncontrolled, with complications (HCC)   Cardiogenic shock (HCC)   Hyperlipidemia   Essential hypertension   Tobacco abuse   ESRD on dialysis ([place])   Acute blood loss anemia   Acute respiratory failure with hypoxia (HCC)   Anemia of chronic disease   Anoxic encephalopathy ([place])  Still not clear with course of events was. She had bleeding which was significant, however after 2 units of blood her hemoglobin came back up to 12.9. -- Interestingly, hemoglobin on 10/10 showed a significant drop. There is no sign of active bleeding, this makes me concerned that the intermittent readings were also elevated.  Perhaps she became enough anemic with her existing disease that she  suffered a cardiac arrest. Is unclear as to the timing of when the ST elevation MI occurred, but it would appear that it may been following the treatment of bleeding with tranexamic acid.   Initially several clots noted in HD line early on. Now less frequent clotting.  This is somewhat concerning in setting of recent coronary stent placement - now on heparin drip. Remains on [place] cooling Protocol, but with Cardizem and shock is requiring significant vasopressors to maintain stable pressures. LVEDP was quite low at catheterization, suggesting volume depletion - would avoid excess fluid removal and dialysis.  From a cardiac standpoint she now has 2 drug-eluting stents in the circumflex with a severe existing disease in LAD and diffusely in the RCA.  High-dose atorvastatin, aspirin and Plavix ordered.  Now off pressors. We'll start low-dose carvedilol. (Would prefer metoprolol, but contraindicated with corn allergy)  Remains on IV heparin due to clotting of HD tubes - but at no dose.  Echo showed preserved EF, but really for quality. Would consider repeat echo later this week versus early next week.  Euvolmeic on CVVHD  Hemoglobin this AM ~7.5  - given a second unit of blood this morning with follow-up CBC pending. No sign of retroperitoneal bleed. Interestingly, hemoglobin on 10/10 showed a significant drop. There is no sign of active bleeding, this makes me concerned that the intermittent readings were also elevated.   Adrian M2 continue vent weaning.  As her presentation was not originally cardiac driven, I suspect that she was not overtly symptomatic with the existing CAD she had. With that in mind, in light of recent bleeding issues, the potential best option for she to recover be to  consider medical management of the LAD and RCA disease with thoughts to potentially evaluate as an outpatient with either a stress test or relook catheterization with staged PCI. She is now already on dual  antiplatelet therapy.  We will follow   Signed, Glenetta Hew, M.D., M.S. Interventional Cardiologist   Pager # [phone] Phone # [phone] [address] [place], [place] [address]  10/06/2016, 8:52 AM

## 2016-10-06 NOTE — Progress Notes (Signed)
PTT  >200 called from lab at 0639. Zollie Scale MD Dr Mortimer Fries at [phone]. Day shift RN to alert rounding MD.

## 2016-10-07 ENCOUNTER — Inpatient Hospital Stay (HOSPITAL_COMMUNITY): Payer: Medicare Other

## 2016-10-07 DIAGNOSIS — Z9889 Other specified postprocedural states: Secondary | ICD-10-CM

## 2016-10-07 DIAGNOSIS — R195 Other fecal abnormalities: Secondary | ICD-10-CM

## 2016-10-07 LAB — TYPE AND SCREEN
ABO/RH(D): O POS
Antibody Screen: NEGATIVE
UNIT DIVISION: 0
UNIT DIVISION: 0
UNIT DIVISION: 0
UNIT DIVISION: 0
Unit division: 0
Unit division: 0

## 2016-10-07 LAB — BLOOD GAS, ARTERIAL
ACID-BASE DEFICIT: 0.8 mmol/L (ref 0.0–2.0)
Bicarbonate: 23.2 mmol/L (ref 20.0–28.0)
Drawn by: 46203
FIO2: 40
O2 SAT: 87.8 %
PEEP/CPAP: 5 cmH2O
PH ART: 7.421 (ref 7.350–7.450)
Patient temperature: 97.4
RATE: 25 resp/min
VT: 420 mL
pCO2 arterial: 36.1 mmHg (ref 32.0–48.0)
pO2, Arterial: 54.4 mmHg — ABNORMAL LOW (ref 83.0–108.0)

## 2016-10-07 LAB — HEPATIC FUNCTION PANEL
ALT: 79 U/L — ABNORMAL HIGH (ref 14–54)
AST: 98 U/L — ABNORMAL HIGH (ref 15–41)
Albumin: 1.4 g/dL — ABNORMAL LOW (ref 3.5–5.0)
Alkaline Phosphatase: 54 U/L (ref 38–126)
BILIRUBIN DIRECT: 0.2 mg/dL (ref 0.1–0.5)
BILIRUBIN INDIRECT: 0.4 mg/dL (ref 0.3–0.9)
BILIRUBIN TOTAL: 0.6 mg/dL (ref 0.3–1.2)
Total Protein: 3.3 g/dL — ABNORMAL LOW (ref 6.5–8.1)

## 2016-10-07 LAB — RENAL FUNCTION PANEL
ALBUMIN: 1.4 g/dL — AB (ref 3.5–5.0)
ANION GAP: 4 — AB (ref 5–15)
ANION GAP: 3 — AB (ref 5–15)
Albumin: 1.5 g/dL — ABNORMAL LOW (ref 3.5–5.0)
BUN: 27 mg/dL — ABNORMAL HIGH (ref 6–20)
BUN: 23 mg/dL — AB (ref 6–20)
CHLORIDE: 107 mmol/L (ref 101–111)
CO2: 26 mmol/L (ref 22–32)
CO2: 27 mmol/L (ref 22–32)
Calcium: 8 mg/dL — ABNORMAL LOW (ref 8.9–10.3)
Calcium: 8.4 mg/dL — ABNORMAL LOW (ref 8.9–10.3)
Chloride: 108 mmol/L (ref 101–111)
Creatinine, Ser: 2.23 mg/dL — ABNORMAL HIGH (ref 0.44–1.00)
Creatinine, Ser: 2.04 mg/dL — ABNORMAL HIGH (ref 0.44–1.00)
GFR calc Af Amer: 29 mL/min — ABNORMAL LOW (ref >60)
GFR calc non Af Amer: 25 mL/min — ABNORMAL LOW (ref >60)
GFR, EST AFRICAN AMERICAN: 26 mL/min — AB (ref >60)
GFR, EST NON AFRICAN AMERICAN: 23 mL/min — AB (ref >60)
GLUCOSE: 93 mg/dL (ref 65–99)
Glucose, Bld: 95 mg/dL (ref 65–99)
PHOSPHORUS: 3.1 mg/dL (ref 2.5–4.6)
PHOSPHORUS: 3.9 mg/dL (ref 2.5–4.6)
POTASSIUM: 4.7 mmol/L (ref 3.5–5.1)
POTASSIUM: 4.8 mmol/L (ref 3.5–5.1)
Sodium: 138 mmol/L (ref 135–145)
Sodium: 137 mmol/L (ref 135–145)

## 2016-10-07 LAB — POCT I-STAT 3, ART BLOOD GAS (G3+)
ACID-BASE EXCESS: 8 mmol/L — AB (ref 0.0–2.0)
BICARBONATE: 35.3 mmol/L — AB (ref 20.0–28.0)
O2 Saturation: 100 %
PO2 ART: 214 mmHg — AB (ref 83.0–108.0)
Patient temperature: 97.9
TCO2: 37 mmol/L (ref 0–100)
pCO2 arterial: 69.3 mmHg (ref 32.0–48.0)
pH, Arterial: 7.313 — ABNORMAL LOW (ref 7.350–7.450)

## 2016-10-07 LAB — CBC
HCT: 17 % — ABNORMAL LOW (ref 36.0–46.0)
HCT: 19.6 % — ABNORMAL LOW (ref 36.0–46.0)
HEMOGLOBIN: 5.6 g/dL — AB (ref 12.0–15.0)
HEMOGLOBIN: 6.6 g/dL — AB (ref 12.0–15.0)
MCH: 30.6 pg (ref 26.0–34.0)
MCH: 31 pg (ref 26.0–34.0)
MCHC: 32.9 g/dL (ref 30.0–36.0)
MCHC: 33.7 g/dL (ref 30.0–36.0)
MCV: 92.9 fL (ref 78.0–100.0)
MCV: 92 fL (ref 78.0–100.0)
Platelets: 100 10*3/uL — ABNORMAL LOW (ref 150–400)
Platelets: 89 10*3/uL — ABNORMAL LOW (ref 150–400)
RBC: 1.83 MIL/uL — AB (ref 3.87–5.11)
RBC: 2.13 MIL/uL — ABNORMAL LOW (ref 3.87–5.11)
RDW: 16.8 % — ABNORMAL HIGH (ref 11.5–15.5)
RDW: 17.1 % — AB (ref 11.5–15.5)
WBC: 12 10*3/uL — ABNORMAL HIGH (ref 4.0–10.5)
WBC: 15.6 10*3/uL — ABNORMAL HIGH (ref 4.0–10.5)

## 2016-10-07 LAB — PREPARE RBC (CROSSMATCH)

## 2016-10-07 LAB — POCT ACTIVATED CLOTTING TIME
ACTIVATED CLOTTING TIME: 186 s
ACTIVATED CLOTTING TIME: 191 s
ACTIVATED CLOTTING TIME: 191 s
ACTIVATED CLOTTING TIME: 208 s
Activated Clotting Time: 180 seconds
Activated Clotting Time: 197 seconds
Activated Clotting Time: 197 seconds
Activated Clotting Time: 208 seconds
Activated Clotting Time: 197 seconds
Activated Clotting Time: 197 seconds

## 2016-10-07 LAB — GLUCOSE, CAPILLARY
GLUCOSE-CAPILLARY: 98 mg/dL (ref 65–99)
GLUCOSE-CAPILLARY: 99 mg/dL (ref 65–99)
GLUCOSE-CAPILLARY: 83 mg/dL (ref 65–99)
Glucose-Capillary: 100 mg/dL — ABNORMAL HIGH (ref 65–99)
Glucose-Capillary: 103 mg/dL — ABNORMAL HIGH (ref 65–99)
Glucose-Capillary: 77 mg/dL (ref 65–99)
Glucose-Capillary: 93 mg/dL (ref 65–99)

## 2016-10-07 LAB — COMPREHENSIVE METABOLIC PANEL
ALBUMIN: 1.2 g/dL — AB (ref 3.5–5.0)
ALK PHOS: 64 U/L (ref 38–126)
ALT: 1990 U/L — ABNORMAL HIGH (ref 14–54)
ANION GAP: 12 (ref 5–15)
AST: 2034 U/L — ABNORMAL HIGH (ref 15–41)
BUN: 25 mg/dL — ABNORMAL HIGH (ref 6–20)
CALCIUM: 7.8 mg/dL — AB (ref 8.9–10.3)
CO2: 23 mmol/L (ref 22–32)
Chloride: 106 mmol/L (ref 101–111)
Creatinine, Ser: 2.36 mg/dL — ABNORMAL HIGH (ref 0.44–1.00)
GFR calc non Af Amer: 21 mL/min — ABNORMAL LOW (ref >60)
GFR, EST AFRICAN AMERICAN: 24 mL/min — AB (ref >60)
GLUCOSE: 50 mg/dL — AB (ref 65–99)
POTASSIUM: 5.5 mmol/L — AB (ref 3.5–5.1)
SODIUM: 141 mmol/L (ref 135–145)
TOTAL PROTEIN: 3 g/dL — AB (ref 6.5–8.1)
Total Bilirubin: 0.6 mg/dL (ref 0.3–1.2)

## 2016-10-07 LAB — APTT: aPTT: 106 seconds — ABNORMAL HIGH (ref 24–36)

## 2016-10-07 LAB — OCCULT BLOOD X 1 CARD TO LAB, STOOL: Fecal Occult Bld: POSITIVE — AB

## 2016-10-07 LAB — HEMOGLOBIN AND HEMATOCRIT, BLOOD
HCT: 27.9 % — ABNORMAL LOW (ref 36.0–46.0)
HEMOGLOBIN: 9.2 g/dL — AB (ref 12.0–15.0)

## 2016-10-07 LAB — PROTIME-INR
INR: 2.05
Prothrombin Time: 23.5 seconds — ABNORMAL HIGH (ref 11.4–15.2)

## 2016-10-07 LAB — MAGNESIUM
MAGNESIUM: 2.3 mg/dL (ref 1.7–2.4)
MAGNESIUM: 2.8 mg/dL — AB (ref 1.7–2.4)

## 2016-10-07 LAB — PHOSPHORUS: PHOSPHORUS: 7.7 mg/dL — AB (ref 2.5–4.6)

## 2016-10-07 MED ORDER — ASPIRIN 81 MG PO CHEW: 81.0000 mg | CHEWABLE_TABLET | Freq: Every day | ORAL | Status: DC

## 2016-10-07 MED ORDER — BIOTENE DRY MOUTH MT LIQD: 15.0000 mL | OROMUCOSAL | Status: DC | PRN

## 2016-10-07 MED ORDER — GLYCOPYRROLATE 0.2 MG/ML IJ SOLN
0.2000 mg | INTRAMUSCULAR | Status: DC | PRN
  Filled 2016-10-07: qty 1

## 2016-10-07 MED ORDER — SODIUM CHLORIDE 0.9 % IV SOLN
Freq: Once | INTRAVENOUS | Status: AC
  Administered 2016-10-07: 10 mL/h via INTRAVENOUS

## 2016-10-07 MED ORDER — SODIUM CHLORIDE 0.9 % IV SOLN
Freq: Once | INTRAVENOUS | Status: AC
  Administered 2016-10-07: 06:00:00 via INTRAVENOUS

## 2016-10-07 MED ORDER — SODIUM CHLORIDE 0.9 % IV SOLN
INTRAVENOUS | Status: DC
  Administered 2016-10-07: 21:00:00 via INTRAVENOUS

## 2016-10-07 MED ORDER — AMIODARONE HCL IN DEXTROSE 360-4.14 MG/200ML-% IV SOLN
60.0000 mg/h | INTRAVENOUS | Status: DC
  Administered 2016-10-07: 60 mg/h via INTRAVENOUS

## 2016-10-07 MED ORDER — SODIUM CHLORIDE 0.9 % IV SOLN
0.0000 mg/h | INTRAVENOUS | Status: DC
  Administered 2016-10-07: 5 mg/h via INTRAVENOUS
  Filled 2016-10-07: qty 10

## 2016-10-07 MED ORDER — AMIODARONE HCL IN DEXTROSE 360-4.14 MG/200ML-% IV SOLN
INTRAVENOUS | Status: AC
  Filled 2016-10-07: qty 200

## 2016-10-07 MED ORDER — AMIODARONE LOAD VIA INFUSION
150.0000 mg | Freq: Once | INTRAVENOUS | Status: AC
  Administered 2016-10-07: 150 mg via INTRAVENOUS
  Filled 2016-10-07: qty 83.34

## 2016-10-07 MED ORDER — MORPHINE BOLUS VIA INFUSION
1.0000 mg | INTRAVENOUS | Status: DC | PRN
  Filled 2016-10-07: qty 1

## 2016-10-07 MED ORDER — SODIUM CHLORIDE 0.9% FLUSH
10.0000 mL | Freq: Two times a day (BID) | INTRAVENOUS | Status: DC
  Administered 2016-10-07: 20 mL
  Administered 2016-10-07: 10 mL

## 2016-10-07 MED ORDER — POLYVINYL ALCOHOL 1.4 % OP SOLN
1.0000 [drp] | Freq: Four times a day (QID) | OPHTHALMIC | Status: DC | PRN
  Filled 2016-10-07: qty 15

## 2016-10-07 MED ORDER — GLYCOPYRROLATE 1 MG PO TABS
1.0000 mg | ORAL_TABLET | ORAL | Status: DC | PRN
  Filled 2016-10-07: qty 1

## 2016-10-07 MED ORDER — AMIODARONE HCL IN DEXTROSE 360-4.14 MG/200ML-% IV SOLN: 30.0000 mg/h | INTRAVENOUS | Status: DC

## 2016-10-07 NOTE — Progress Notes (Signed)
PCCM Interval Progress Note  See code documentation from [facility] Team as well as progress notes from Dr. Elsworth Soho regarding prior events this evening including cardiac arrest and VF x 2.  Due to the above, I have had extensive discussions with multiple family members including pt's son and several sisters. We discussed Mrs. Cappiello's current circumstances, organ failures, and extremely poor prognosis. We also discussed patient's prior wishes under circumstances such as this. The family has decided to offer full comfort care for Mrs. Pretty.  They have been fully updated on the process and expectations and all questions have been answered.   Montey Hora, [facility] Pager: [phone]  or [phone] 10/07/2016, 10:26 PM

## 2016-10-07 NOTE — Consult Note (Signed)
[facility] Consult: 1:26 PM 10/07/2016  LOS: 4 days    Referring Provider: Dr. Nelda Marseille  Primary Care Physician:  Hoyt Koch, MD Primary Gastroenterologist:  Dr. Silvano Rusk    Reason for Consultation:  Anemia and FOBT positive stool.      [facility] Attending   I have taken an interval history, reviewed the chart and examined the patient. I agree with the Advanced Practitioner's note, impression and recommendations.  See additions in bold.  Gatha Mayer, MD, [facility] [phone] (pager) [phone] after 5 PM, weekends and holidays  10/07/2016 5:13 PM     IMPRESSION:   *  Acute blood loss anemia due to bleeding from AV graft, hematoma at left groin, cardiac cath losses and some GI blood loss. Status post PRBCs x 6.    Unfortunately, she needs to continue on Plavix.  *  Brown, liquid, FOBT positive stool. Flex E seal in place.  Suspect she had some ischemic injury to the colon.    *  Status post PEA arrest, STEMI. Status post cardiac cath with tandem drug-eluting stent placement to circumflex lesion.  On Plavix.  *  End-stage renal disease.  Ongoing CRRT.   No good evidence for major GI bleeding as we did not ID melena. Multiple other reasons for Hgb decrease - could have exacerbation of groin hematoma.  PLAN:     *  Agree with continuing oral solution Protonix via tube once daily. She doesn't need a PPI drip or IV Protonix.  *  Follow hemoglobins and transfuse as indicated.  *  Follow PT/INR.  No role for endoscopic evaluation based upon what we now. Needs good supportive care as you are. If Hgb declines more and no melena then would consider reassessing for groin bleeding if necessary.  Azucena Freed  10/07/2016, 1:26 PM Pager: [phone]  HPI:  Kim Hodge is a 60 y.o. female.  PMH DM.  ESRD on HD.      01/2016 screening colonoscopy 1.   Two sessile polyps ranging from 10 to 17m in size were found at the cecum; polypectomies were performed using snare cautery 2.   Two sessile polyps ranging from 4 to 647min size were found in the sigmoid colon; polypectomies were performed with a cold snare 3.   There was mild diverticulosis noted in the left colon 4.   External and internal hemorrhoids 5.   The examination was otherwise normal2/2017 colonoscopy. Screening study.   Pathology: 2 sessile, serrated cecal polyps. 2 diminutive hyperplastic sigmoid polyps.  Will need recall colonoscopy in 2020  Recent trouble with thinning of LUE graft and difficulty obtaining hemostasis after HD.  Thrombectomy of graft in 08/2016. Blood heavily from the graft site on 10/8. Lost consciousness, EMS arrived to the home and estimated she had at least 2 L of blood loss seen in her bed. She required greater than 10 minutes of CPR for PEA, but did return to spontaneous circulation. She was intubated.  Arctic sun protocol initiated for STEMI. The excessive bleeding was treated  with tranexemic acid and Dr Oneida Alar over-sewed ulcerated area at graft site.  The graft is now clotted and nonfunctional. Status post cardiac cath 10/8, vascular access was via the left groin since she required insertion of hemodialysis catheter on the right groin. This revealed multivessel disease.  PCI with placement of overlapping drug-eluting stents to the circumflex performed.  Melena reported. She is FOBT positive today. 81 mg aspirin discontinued, last dose yesterday morning.  She is still getting Plavix, last dose was this morning, was not taking his PTA.  She continues to receive heparin infusion while undergoing CRRT.  Levothroid in place for pressure support Hemoglobin at arrival was a bit over 13.  It has fluctuated with transfusions but was 7.5 at 11 PM yesterday and dropped to 5.6  at 4 AM today. It is now back up to 9.2 following further blood transfusion. Platelets are in the 90s. PT/INR 19.6/1.6 at initial arrival jumped up to 43.7/4.4 within a few hours and had improved to 24.6-2.1 by the evening on the day of her arrival. PT/INR have not been checked since. PTT is in the low 200s.  CT abdomen pelvis on 10/10. Left pneumothorax, small. Small left groin hematoma from recent cardiac cath. No retroperitoneal hematoma.     Past Medical History:  Diagnosis Date  . Anemia   . Chronic bronchitis   . Daily headache   . Degenerative joint disease    "right knee" (11/06/2015)  . Depression   . Diabetes mellitus    "only when I take steroids" (11/06/2015)  . ESRD (end stage renal disease) on dialysis [facility])    "[place]; off [employer]; MWF" (11/06/2015)  . Family history of adverse reaction to anesthesia    "sister took a long long time to wake up"  . Fibrosis of uterus   . GERD (gastroesophageal reflux disease)   . Hx of colonic polyps 02/25/2016  . Hyperlipidemia   . Hyperparathyroidism, secondary renal ([place])   . Hypertension   . Membranoproliferative glomerulonephritis    type 1  . NEPHROTIC SYNDROME 05/07/2010   Qualifier: Diagnosis of  By: Hollie Salk CMA, [name]    . Neuromuscular disorder (HCC)    neuropathy  . Obesity   . Pneumonia ?01/2015; 06/09/2015; 11/06/2015  . Shortness of breath dyspnea     Past Surgical History:  Procedure Laterality Date  . ARTERIOVENOUS GRAFT PLACEMENT Left 02/2010   upper arm; wrapped it around the fistula"  . AV FISTULA PLACEMENT Left 01/02/10   upper arm; "only lasted 2 months"  . CARDIAC CATHETERIZATION N/A 10/12/2016   Procedure: LEFT HEART CATH AND CORONARY ANGIOGRAPHY;  Surgeon: Lorretta Harp, MD;  Location: [facility];  Service: Cardiovascular;  Laterality: N/A;  . CARDIAC CATHETERIZATION N/A 10/26/2016   Procedure: Coronary Stent Intervention;  Surgeon: Lorretta Harp, MD;  Location: [facility]  [facility];  Service: Cardiovascular;  Laterality: N/A;  . COLONOSCOPY     Dr Lajoyce Corners  . ESOPHAGOGASTRODUODENOSCOPY     Dr Lajoyce Corners  . KNEE ARTHROSCOPY Right 2006  . UPPER GASTROINTESTINAL ENDOSCOPY    . VAGINAL HYSTERECTOMY  2011    Prior to Admission medications   Medication Sig Start Date End Date Taking? Authorizing Provider  albuterol (PROVENTIL HFA;VENTOLIN HFA) 108 (90 Base) MCG/ACT inhaler Inhale 1-2 puffs into the lungs every 6 (six) hours as needed for wheezing or shortness of breath. 07/08/16  Yes Hoyt Koch, MD  amLODipine (NORVASC) 10 MG tablet Take 10 mg by mouth  daily.  08/28/16  Yes Historical Provider, MD  gabapentin (NEURONTIN) 300 MG capsule Take 300 mg by mouth at bedtime.    Yes Historical Provider, MD  pravastatin (PRAVACHOL) 20 MG tablet Take 20 mg by mouth at bedtime. Reported on 01/27/2016   Yes Historical Provider, MD  acetaminophen (TYLENOL) 500 MG tablet Take 1,500 mg by mouth every 8 (eight) hours as needed for moderate pain or headache. Reported on 06/08/2016    Historical Provider, MD  calcium acetate (PHOSLO) 667 MG capsule Take by mouth 3 (three) times daily with meals. Reported on 06/15/2016    Historical Provider, MD  cinacalcet (SENSIPAR) 90 MG tablet Take 90 mg by mouth daily.    Historical Provider, MD  famotidine (ACID REDUCER MAXIMUM STRENGTH) 20 MG tablet Take 20 mg by mouth as needed for heartburn or indigestion.    Historical Provider, MD  fexofenadine (ALLEGRA) 180 MG tablet Take 180 mg by mouth daily.    Historical Provider, MD    Scheduled Meds: . [START ON 10/11/2016] aspirin  81 mg Oral Daily  . atorvastatin  80 mg Oral q1800  . chlorhexidine gluconate (MEDLINE KIT)  15 mL Mouth Rinse BID  . clopidogrel  75 mg Oral Q breakfast  . feeding supplement (NEPRO CARB STEADY)  1,000 mL Per Tube Q24H  . feeding supplement (PRO-STAT SUGAR FREE 64)  60 mL Per Tube TID  . insulin aspart  1-3 Units Subcutaneous Q4H  . insulin glargine  10 Units Subcutaneous  QHS  . mouth rinse  15 mL Mouth Rinse 10 times per day  . pantoprazole sodium  40 mg Per Tube Daily  . sodium chloride  1,000 mL Intravenous Once  . sodium chloride flush  10-40 mL Intracatheter Q12H   Infusions: . sodium chloride 10 mL/hr at 10/07/16 0800  . dextrose    . heparin 10,000 units/ 20 mL infusion syringe 1,000 Units/hr (10/07/16 1032)  . norepinephrine (LEVOPHED) Adult infusion 5 mcg/min (10/07/16 0931)  . dialysis replacement fluid (prismasate) 600 mL/hr at 10/07/16 0625  . dialysis replacement fluid (prismasate) 200 mL/hr at 10/06/16 1154  . dialysate (PRISMASATE) 1,800 mL/hr at 10/07/16 1149  . vasopressin (PITRESSIN) infusion - *FOR SHOCK* Stopped (10/05/16 1030)   PRN Meds: acetaminophen, dextrose, fentaNYL, fentaNYL (SUBLIMAZE) injection, heparin, heparin, heparin, midazolam, nitroGLYCERIN, ondansetron (ZOFRAN) IV, sodium chloride flush   Allergies as of 10/04/2016 - Review Complete 10/15/2016  Allergen Reaction Noted  . Ace inhibitors Anaphylaxis, Shortness Of Breath, and Swelling   . Corn-containing products Diarrhea 09/30/2016    Family History  Problem Relation Age of Onset  . Cancer Mother     breast  . Breast cancer Mother   . Stroke Father   . Colon cancer Neg Hx   . Colon polyps Neg Hx     Social History   Social History  . Marital status: Single    Spouse name: N/A  . Number of children: N/A  . Years of education: N/A   Occupational History  . Not on file.   Social History Main Topics  . Smoking status: Current Some Day Smoker    Packs/day: 0.33    Years: 44.00    Types: Cigarettes  . Smokeless tobacco: Never Used     Comment: 1 pk every 3 days.   . Alcohol use No  . Drug use:     Types: Marijuana     Comment: 11/06/2015 "smoke marijuana ~ twice/wk"  . Sexual activity: Not Currently   Other Topics  Concern  . Not on file   Social History Narrative  . No narrative on file    REVIEW OF SYSTEMS: Not able to obtain a review of  systems from the patient. The nurse tells me that a flex cystoscopy catheter was placed due to excessive stools. These are liquid and brown but not melenic. Patient is intubated and unable to provide a history. Does not seem to be in any pain. She is tolerating tube feeds via panda -type feeding tube.   PHYSICAL EXAM: Vital signs in last 24 hours: Vitals:   10/07/16 1400 10/07/16 1315  BP: 79/59.    Pulse: 93.    Resp: (!) 22 (!) 23  Temp:     Wt Readings from Last 3 Encounters:  10/07/16 87.9 kg (193 lb 12.6 oz)  09/16/16 78 kg (172 lb)  07/01/16 79.8 kg (176 lb)    General: Ill appearing AAF. She is intubated. Warming blanket in place.  CRRT in place. Head:  No signs of trauma. Symmetric facies.  Eyes:  No scleral icterus, no conjunctival pallor. Ears:  Unable to assess her hearing.  Nose:  No discharge. Panda-type feeding tube in place. Mouth:  Intubated. No blood is a mole in the mouth. Neck:  No mass, no JVD appreciated. Lungs:  Rhonchorous, rough breath sounds. Breathing quietly, steadily on vent. Heart: RRR. Sinus rhythm at 93 on the monitor.  Abdomen:  Soft. Bowel sounds hypoactive. No tenderness, guarding or rebound. No masses or bruits..   Rectal: Liquid, semi-sweet chocolate colored brown stool in the flexiseal bag. This is not melena.   Musc/Skeltl: No obvious joint deformities. Extremities:  There is a large hematoma in the left groin where she had cardiac cath access obtained.  Neurologic:  Unresponsive on vent. Skin:  No open sores or rashes. The AV graft site up in the left upper arm, though on that was oversewn, has a clean bandage covering this. Nurse tells me this has not been bleeding.   Intake/Output from previous day: 10/11 0701 - 10/12 0700 In: 1723.3 [I.V.:280; Blood:543.3; NG/GT:900] Out: 1133  Intake/Output this shift: Total I/O In: 669.9 [I.V.:113.9; Blood:476; NG/GT:80] Out: 260 [Other:260]  LAB RESULTS:  Recent Labs  10/06/16 0427  10/06/16 1030 10/06/16 1112 10/07/16 0402 10/07/16 1117  WBC 11.5* 11.6*  --  12.0*  --   HGB 7.5* 8.2* 7.5* 5.6* 9.2*  HCT 21.5* 23.9* 22.0* 17.0* 27.9*  PLT 99* 97*  --  100*  --    BMET Lab Results  Component Value Date   NA 138 10/07/2016   NA 139 10/06/2016   NA 139 10/06/2016   K 4.7 10/07/2016   K 4.5 10/06/2016   K 4.3 10/06/2016   CL 108 10/07/2016   CL 106 10/06/2016   CL 106 10/06/2016   CL 106 10/06/2016   CO2 26 10/07/2016   CO2 28 10/06/2016   CO2 24 10/06/2016   CO2 22 10/06/2016   GLUCOSE 95 10/07/2016   GLUCOSE 93 10/06/2016   GLUCOSE 82 10/06/2016   GLUCOSE 79 10/06/2016   BUN 27 (H) 10/07/2016   BUN 23 (H) 10/06/2016   BUN 20 10/06/2016   BUN 21 (H) 10/06/2016   CREATININE 2.23 (H) 10/07/2016   CREATININE 2.55 (H) 10/06/2016   CREATININE 2.85 (H) 10/06/2016   CREATININE 2.84 (H) 10/06/2016   CALCIUM 8.0 (L) 10/07/2016   CALCIUM 8.5 (L) 10/06/2016   CALCIUM 7.8 (L) 10/06/2016   CALCIUM 7.8 (L) 10/06/2016   LFT  Recent Labs  10/06/16 1630 10/07/16 0403 10/07/16 0558  PROT  --   --  3.3*  ALBUMIN 1.7* 1.4* 1.4*  AST  --   --  98*  ALT  --   --  79*  ALKPHOS  --   --  54  BILITOT  --   --  0.6  BILIDIR  --   --  0.2  IBILI  --   --  0.4   PT/INR Lab Results  Component Value Date   INR 2.18 10/02/2016   INR 4.46 (HH) 10/26/2016   INR 1.64 09/27/2016

## 2016-10-07 NOTE — Progress Notes (Signed)
[facility] Rounding Note  Subjective/Interval History:   CRRT going OK Current Rx all 4K 600/200/1800 Keeping volume even  Had CT abd/pelvis 10/10  out of concern for RP hematoma - had only a small L groin hematoma but no RP bleed (also had small L PTX) .  Stool heme+ Transfused total of 6 units (counting today's) since admission  Pressors back on  Sedation off for 48 hours - opens eyes but doesn't follow any commands  Objective Vital signs in last 24 hours: Vitals:   10/07/16 0926 10/07/16 0930 10/07/16 0945 10/07/16 1000  BP: (!) 77/62 (!) 84/59 (!) 85/60   Pulse:      Resp: (!) 24 (!) 26 (!) 21 (!) 24  Temp: 97.6 F (36.4 C)     TempSrc: Oral     SpO2: 100%     Weight:      Height:       Weight change: -1.3 kg (-2 lb 13.9 oz)  Intake/Output Summary (Last 24 hours) at 10/07/16 1002 Last data filed at 10/07/16 1000  Gross per 24 hour  Intake          1748.82 ml  Output             1021 ml  Net           727.82 ml   Physical Exam:  Blood pressure (!) 85/60, pulse 86, temperature 97.6 F (36.4 C), temperature source Oral, resp. rate (!) 24, height 5' 3"  (1.6 m), weight 87.9 kg (193 lb 12.6 oz), SpO2 100 %.   Gen intubated, opens eyes only, not following any commands ETT  L IJ TCL R fem HD cath (10/8) in use for CRRT Anasarca, periorbital/peripheral edema CVP 3 Chest clear anteriorly  Regular S1S2 No S3 Abd soft, not distended, + BS + edema diffusely  LUA AVG no bruit / thrill R femoral HD catheter (10/9) L groin hematoma stable to external exam  Weight summary:           10/07/16  87.9 kg   10/06/16  89.2 kg   10/05/16 0600  86.5 kg    10/04/16 0610  83.4 kg    10/17/2016 1008  78 kg       Recent Labs Lab 10/04/16 2224 10/05/16 0224 10/05/16 0630 10/05/16 1830 10/05/16 1853 10/06/16 0427 10/06/16 1112 10/06/16 1630 10/07/16 0403  NA 135 135 135  --  137 136  137 139 139 138  K 4.9 4.6 5.0  --  4.7 4.2  4.2 4.3 4.5 4.7  CL 106 108 105  --  106 106   106  --  106 108  CO2 16* 15* 17*  --  22 22  24   --  28 26  GLUCOSE 146* 123* 105*  --  94 79  82  --  93 95  BUN 26* 24* 20  --  20 21*  20  --  23* 27*  CREATININE 5.84* 4.88* 4.67*  --  3.81* 2.84*  2.85*  --  2.55* 2.23*  CALCIUM 7.1* 6.7* 7.3*  --  8.0* 7.8*  7.8*  --  8.5* 8.0*  PHOS  --  5.0* 4.6 4.4 4.5 3.2  3.3  --  3.9 3.1     Recent Labs Lab 09/30/2016 0742 10/07/2016 0952  10/06/16 1630 10/07/16 0403 10/07/16 0558  AST 47* 152*  --   --   --  98*  ALT 33 133*  --   --   --  79*  ALKPHOS 81 104  --   --   --  54  BILITOT 0.3 0.5  --   --   --  0.6  PROT 3.5* 4.6*  --   --   --  3.3*  ALBUMIN 1.7* 2.3*  < > 1.7* 1.4* 1.4*  < > = values in this interval not displayed.   Recent Labs Lab 10/05/16 1300  10/06/16 0427 10/06/16 1030 10/06/16 1112 10/07/16 0402  WBC 14.4*  --  11.5* 11.6*  --  12.0*  HGB 7.6*  < > 7.5* 8.2* 7.5* 5.6*  HCT 21.7*  < > 21.5* 23.9* 22.0* 17.0*  MCV 90.0  --  89.6 89.8  --  92.9  PLT 121*  --  99* 97*  --  100*  < > = values in this interval not displayed.   Recent Labs Lab 10/23/2016 1235 10/07/2016 2200 10/04/16 0230  TROPONINI 0.67* 3.45* 5.91*     Recent Labs Lab 10/06/16 1937 10/07/16 0012 10/07/16 0341 10/07/16 0816 10/07/16 0837  GLUCAP 99 103* 98 77 93    Studies/Results: Ct Abdomen Pelvis Wo Contrast  Addendum Date: 10/05/2016   ADDENDUM REPORT: 10/05/2016 18:20 ADDENDUM: These results were called by telephone at the time of interpretation on 10/05/2016 at 6:19 pm to Dr. Lamonte Sakai, who verbally acknowledged these results. Electronically Signed   By: Franchot Gallo M.D.   On: 10/05/2016 18:20   Result Date: 10/05/2016 CLINICAL DATA:  Cardiac catheterization 09/28/2016. Rule out retroperitoneal hemorrhage. Drop in hemoglobin. EXAM: CT ABDOMEN AND PELVIS WITHOUT CONTRAST TECHNIQUE: Multidetector CT imaging of the abdomen and pelvis was performed following the standard protocol without IV contrast. COMPARISON:  None.  FINDINGS: Lower chest: Small anterior pneumothorax on the left. This is not identified on chest x-ray this morning. Repeat chest x-ray recommended. Mild bibasilar atelectasis. Diffuse coronary calcification. Hepatobiliary: Contrast filled gallbladder due to recent catheterization. Gallbladder wall non thickened. No biliary dilatation. No focal liver lesion. Pancreas: Negative Spleen: Negative Adrenals/Urinary Tract: Negative for renal obstruction or mass. No urinary tract calculi. Urinary bladder empty containing some gas possibly from recent catheterization. Stomach/Bowel: NG tube in the stomach. Stomach and duodenum normal. No bowel obstruction or mass. Diverticulosis in the left colon without diverticulitis. Normal appendix. Vascular/Lymphatic: Atherosclerotic abdominal aorta without aneurysm. Atherosclerotic disease in the iliac arteries. Soft tissue stranding in the left groin related to small hematoma from recent catheterization. No retroperitoneal hematoma Right femoral venous catheter tip in the right common iliac vein. Reproductive: Hysterectomy.  No pelvic mass. Other: No free-fluid Musculoskeletal: Midline ventral hernia containing fat. No acute skeletal abnormality. IMPRESSION: Small left-sided pneumothorax. Small left groin hematoma from recent cardiac catheterization. Negative for retroperitoneal hematoma. Electronically Signed: By: Franchot Gallo M.D. On: 10/05/2016 18:14   Dg Chest Port 1 View  Result Date: 10/07/2016 CLINICAL DATA:  Endotracheal tube. EXAM: PORTABLE CHEST 1 VIEW COMPARISON:  10/06/2016 FINDINGS: Endotracheal tube tip between the clavicular heads and carina. Left IJ catheter, tip at the upper cavoatrial junction. An orogastric tube reaches the stomach at least. New streaky basilar opacity on the right. Stable mild streaky density at the left base. Normal heart size and mediastinal contours. No effusion or pneumothorax. IMPRESSION: 1. Stable positioning of tubes and central line.  2. Basilar atelectasis, increased on the right since yesterday. Electronically Signed   By: Monte Fantasia M.D.   On: 10/07/2016 08:39   Dg Chest Port 1 View  Result Date: 10/06/2016 CLINICAL DATA:  Intubated patient, respiratory failure, end-stage  renal disease EXAM: PORTABLE CHEST 1 VIEW COMPARISON:  Portable chest x-ray of October 05, 2016 FINDINGS: The lungs are well-expanded. There is left lower lobe interstitial density and to a lesser extent right lower lobe interstitial density. There is no alveolar infiltrate or pleural effusion or pneumothorax. The heart and pulmonary vascularity are normal. There is calcification in the wall of the aortic arch. The endotracheal tube tip lies approximately 2.4 cm above the carina. The esophagogastric tube tip in proximal port lie below the GE junction. The left internal jugular venous catheter tip projects over the midportion of the SVC. IMPRESSION: Bibasilar subsegmental atelectasis.  No alveolar pneumonia nor CHF. The support tubes are in reasonable position. Aortic atherosclerosis. Electronically Signed   By: David  Martinique M.D.   On: 10/06/2016 07:58   Dg Chest Port 1 View  Result Date: 10/05/2016 CLINICAL DATA:  Left pneumothorax on CT abdomen today EXAM: PORTABLE CHEST 1 VIEW COMPARISON:  10/05/2016 chest x-ray and CT abdomen from today. FINDINGS: Endotracheal tube remains low, 1 cm above the carina. Gastric tube enters the stomach. Left jugular central venous catheter tip in the lower SVC. No pneumothorax on the chest x-ray. Definite pneumothorax in the left anterior lung base on CT abdomen from today. No effusion. Negative for edema. Mild bibasilar atelectasis. IMPRESSION: Negative for pneumothorax on chest x-ray. The small pneumothorax on the CT abdomen likely does not extend into the lung apex. Endotracheal tube remains low, 1 cm above the carina. Mild bibasilar atelectasis. Electronically Signed   By: Franchot Gallo M.D.   On: 10/05/2016 19:35   EEG  (10/9) "diffuse cerebral dysfunction that is non-specific in etiology and can be seen with hypoxic/ischemic injury, toxic/metabolic encephalopathies, neurodegenerative disorders, or medication effect.  The absence of epileptiform discharges does not rule out a clinical diagnosis of epilepsy"   Medications: . sodium chloride 10 mL/hr at 10/07/16 0800  . dextrose    . heparin 10,000 units/ 20 mL infusion syringe 1,000 Units/hr (10/07/16 0310)  . norepinephrine (LEVOPHED) Adult infusion 5 mcg/min (10/07/16 0931)  . dialysis replacement fluid (prismasate) 600 mL/hr at 10/07/16 0625  . dialysis replacement fluid (prismasate) 200 mL/hr at 10/06/16 1154  . dialysate (PRISMASATE) 1,800 mL/hr at 10/07/16 0854  . vasopressin (PITRESSIN) infusion - *FOR SHOCK* Stopped (10/05/16 1030)   . [START ON 10/11/2016] aspirin  81 mg Oral Daily  . atorvastatin  80 mg Oral q1800  . chlorhexidine gluconate (MEDLINE KIT)  15 mL Mouth Rinse BID  . clopidogrel  75 mg Oral Q breakfast  . feeding supplement (NEPRO CARB STEADY)  1,000 mL Per Tube Q24H  . feeding supplement (PRO-STAT SUGAR FREE 64)  60 mL Per Tube TID  . insulin aspart  1-3 Units Subcutaneous Q4H  . insulin glargine  10 Units Subcutaneous QHS  . mouth rinse  15 mL Mouth Rinse 10 times per day  . pantoprazole sodium  40 mg Per Tube Daily  . sodium chloride  1,000 mL Intravenous Once  . sodium chloride flush  10-40 mL Intracatheter Q12H    Outpt Dialysis: MWF [facility]   3h 60mn    79kg    2/2.25 bath   P2    Hep 4000   LUA AVG Hect 2 ug tiw No ESA   Background: 60y.o. year-old with hx of HTN, tobacco use, poss COPD [facility]  Patient called 911 10/8 due to bleeding AV access. Found by EMS to be in cardiac arrest, had 3  rounds of CPR, return of respirations.  Came in w epi drip and getting O neg prbc's 2 units. In ED rec'd tranexamic acid, AVG oversewn by Dr. Oneida Alar at site of ulceration (and is now clotted). Acute STEMI c/EKG  changes. Emergent heart cath showed 100% LCx which was stented successfully. Also had 50% L main, 75% LAD stenosis and 80% long RCA stenosis. LVEF was normal and LVEDP was low at 5.  Rec'd [employer] cooling protocol in the cath lab.  CRRT for her ESRD started 10/9.  Assessment/Recommendations  1. PEA arrest/STEMI - 2/2 arrest/hypotension/ABLA +/- poss effect tranexamic acid - s/p heart cath w/ /PCI to 100% LCx, also mod-severe LAD and RCA disease, LV normal. 2 stents in cx with severe LAD and RCA ds. Cards following. S/p cooling protocol  Medical management at this time 2. VDRF - per CCM 3. AVG bleed - AVG bleeding on arrival, rec'd tranexamic acid, ulceration oversewn by [facility] (clotted now) 4. Shock - low LVEDP at cath, CXR clear, no edema; initially was under dry wt 1 kg - Has had massive volume resuscitation for hypotension/low CVP's - weight now up by 11 kg. CVP still low. Keeping volume even with CRRT. Pressors back on 5. ESRD usual HD MWF. Now CRRT. Fem cath (10/9).  K and phos fine. Currently keeping even. Now massive anasarca but still low CVP's.  6. ABLA - has required total of 6 units (counting today's) since admission. Hb down to 5.6 this AM. For 2 units. No RP hematoma on CT, small L groin hematoma stable to exam.  Stool heme + 7. Neuro - off sedation for 48 hours. Opens eyes, not following any commands. Neuro has not seen...   Jamal Maes, MD [facility] [phone] Pager 10/07/2016, 10:02 AM

## 2016-10-07 NOTE — Progress Notes (Signed)
[place] Progress Note Patient Name: ROMONA GRZYWACZ DOB: 06-03-1956 MRN: [id number]   Date of Service  10/07/2016  HPI/Events of Note  VF x 2 >> shock  eICU Interventions  Amio load & infusion     Intervention Category Major Interventions: Arrhythmia - evaluation and management  ALVA,RAKESH V. 10/07/2016, 9:34 PM

## 2016-10-07 NOTE — Progress Notes (Addendum)
Called to 2h13 for groin hematoma. Sizable hematoma present 5inches by 10 inches distal to original access site.  Manual pressure applied for 20 minutes in case of active bleed. Hematoma same size after holding but is much softer when palpated. Dr. Ellyn Hack notified. Dr. Ellyn Hack asked for a femstop to be placed on left groin.  Femostop placed on left groin at 42mmhg at 11:45:00.

## 2016-10-07 NOTE — Progress Notes (Signed)
PULMONARY / CRITICAL CARE MEDICINE   Name: Kim Hodge MRN: [id number] DOB: November 17, 1956    ADMISSION DATE:  10/10/2016  REFERRING MD:  Dr Regenia Skeeter, ED  CHIEF COMPLAINT:  PEA arrest  HISTORY OF PRESENT ILLNESS:   60 yo woman, hx tobacco, ESRD on HD (MWF), DM. Has been seen for thinning above her LUE graft, some difficulty getting hemostasis after HD, thrombectomy after it clotted in mid September. She called EMS am 10/8 with acute heavy bleeding from the graft site. On EMS arrival she was unresponsive with estimated 2L blood in the bed. Received CPR x estimated 10 minutes with [facility]. Intubated in the ED, reportedly had a cough and gag but no other significant neuro responses at that time. Initial Hgb 8.4 > 8.5 down from baseline 10.  She received tranexamic acid in the ED, was seen by Dr Oneida Alar w vascular who over-sewed an ulcerated area over the graft site. Noted that the graft is now clotted, not functional. Subsequent ECG showed evolving ST elevations and cath lab was recommended. She is now in the cath lab on MV, sedation initiated.   SUBJECTIVE: Melena overnight, hypotensive again.  VITAL SIGNS: BP (!) 77/64   Pulse 86   Temp 97.6 F (36.4 C) (Oral)   Resp (!) 27   Ht 5\' 3"  (1.6 m)   Wt 87.9 kg (193 lb 12.6 oz)   SpO2 100%   BMI 34.33 kg/m   HEMODYNAMICS: CVP:  [3 mmHg-9 mmHg] 3 mmHg  VENTILATOR SETTINGS: Vent Mode: PRVC FiO2 (%):  [40 %-60 %] 60 % Set Rate:  [25 bmp] 25 bmp Vt Set:  [420 mL] 420 mL PEEP:  [5 cmH20] 5 cmH20 Pressure Support:  [10 cmH20] 10 cmH20 Plateau Pressure:  [20 cmH20] 20 cmH20  INTAKE / OUTPUT: I/O last 3 completed shifts: In: 2262.2 [I.V.:435.5; Blood:673.3; NG/GT:1153.3] Out: 1828 [Emesis/NG output:250; Other:1578]  PHYSICAL EXAMINATION: General:  Ill appearing woman, arousable and following some commands Neuro:  arousable but not following commands HEENT:  ETT in place, [place]/AT, PERRL, EOM-I and MMM Cardiovascular:  Regular, no M/R/G, Nl  S1/S2 Lungs:  Distant but clear Abdomen:  Soft and tympanitic, + BS Musculoskeletal:  No deformities, no edema Skin:  No rash  LABS:  BMET  Recent Labs Lab 10/06/16 0427 10/06/16 1112 10/06/16 1630 10/07/16 0403  NA 136  137 139 139 138  K 4.2  4.2 4.3 4.5 4.7  CL 106  106  --  106 108  CO2 22  24  --  28 26  BUN 21*  20  --  23* 27*  CREATININE 2.84*  2.85*  --  2.55* 2.23*  GLUCOSE 79  82  --  93 95   Electrolytes  Recent Labs Lab 10/06/16 0427 10/06/16 1630 10/06/16 1700 10/07/16 0402 10/07/16 0403  CALCIUM 7.8*  7.8* 8.5*  --   --  8.0*  MG 2.1  --  2.5* 2.3  --   PHOS 3.2  3.3 3.9  --   --  3.1   CBC  Recent Labs Lab 10/06/16 0427 10/06/16 1030 10/06/16 1112 10/07/16 0402  WBC 11.5* 11.6*  --  12.0*  HGB 7.5* 8.2* 7.5* 5.6*  HCT 21.5* 23.9* 22.0* 17.0*  PLT 99* 97*  --  100*   Coag's  Recent Labs Lab 10/01/2016 0742  10/09/2016 1235 09/30/2016 2050  10/05/16 0220 10/06/16 0427 10/07/16 0402  APTT  --   < > 128* 66*  < > >200* >200* >200*  INR 1.64  --  4.46* 2.18  --   --   --   --   < > = values in this interval not displayed.  Sepsis Markers  Recent Labs Lab 10/05/2016 0856  LATICACIDVEN 5.27*   ABG  Recent Labs Lab 10/05/16 0837 10/06/16 0340 10/07/16 0523  PHART 7.355 7.499* 7.421  PCO2ART 29.2* 31.6* 36.1  PO2ART 81.0* 95.4 54.4*   Liver Enzymes  Recent Labs Lab 10/07/2016 0742 10/18/2016 0952  10/06/16 1630 10/07/16 0403 10/07/16 0558  AST 47* 152*  --   --   --  98*  ALT 33 133*  --   --   --  79*  ALKPHOS 81 104  --   --   --  54  BILITOT 0.3 0.5  --   --   --  0.6  ALBUMIN 1.7* 2.3*  < > 1.7* 1.4* 1.4*  < > = values in this interval not displayed.  Cardiac Enzymes  Recent Labs Lab 10/12/2016 1235 09/26/2016 2200 10/04/16 0230  TROPONINI 0.67* 3.45* 5.91*   Glucose  Recent Labs Lab 10/06/16 1601 10/06/16 1937 10/07/16 0012 10/07/16 0341 10/07/16 0816 10/07/16 0837  GLUCAP 90 99 103* 98 77 93    Imaging Dg Chest Port 1 View  Result Date: 10/07/2016 CLINICAL DATA:  Endotracheal tube. EXAM: PORTABLE CHEST 1 VIEW COMPARISON:  10/06/2016 FINDINGS: Endotracheal tube tip between the clavicular heads and carina. Left IJ catheter, tip at the upper cavoatrial junction. An orogastric tube reaches the stomach at least. New streaky basilar opacity on the right. Stable mild streaky density at the left base. Normal heart size and mediastinal contours. No effusion or pneumothorax. IMPRESSION: 1. Stable positioning of tubes and central line. 2. Basilar atelectasis, increased on the right since yesterday. Electronically Signed   By: Monte Fantasia M.D.   On: 10/07/2016 08:39   STUDIES:  Head Ct 10/8 >> no acute bleed or CVA  CULTURES:   ANTIBIOTICS:   SIGNIFICANT EVENTS: Cardiac arrest after AV graft bleed 10/8 STEMI 10/8 > to cath lab >   LINES/TUBES: LUE AV graft x several years ETT 10/8>>> L IJ TLC 10/8>>> R femoral HD catheter 10/8>>>10/10 R radial a-line 10/8>>>10/10  DISCUSSION: 60 year old female with ESRD on HD who was seen for issues with LUE fistula who called EMS due to heavy bleeding from the fistula.  Was brought to the ED where she had it sewn over but then had suffered a cardiac arrest.  Was taken to the cath lab and RCA was stenting.  Currently on heparin drip after being given tranexamic acid to control bleeding.  Total downtime of 10 minutes.  Hypothermia protcol.  ASSESSMENT / PLAN:  PULMONARY A: Acute respiratory failure with hypoxemia, due to cardiac arrest PTX on the left, small and stable P:  PRVC 8cc/kg VAP prevention orders Titrate O2 for sat of 88-92%. Hold off pressure support for now given hemodynamics No chest tube for PTX anticipate was during arrest and has been stable on positive pressure since  CARDIOVASCULAR A:  PEA arrest, presumed hemorrhagic due to acute blood loss anemia STEMI due either to arrest / hypotension + possible contribution  tranexamic acid Baseline HTN, hyperlipidemia P:  Levophed 5. Vasopressin off. Heparin drip off. Statin, other cardiac meds as per Cardiology plans.  Hold beta blockers for shock. D/C stress dose steroids  RENAL A:   ESRD Bleeding AV graft, repaired P:   Appreciate Dr Oneida Alar' help  HD catheter in place, CRRT  started Replace electrolytes as indicated CRRT even KVO IVF  GASTROINTESTINAL A:   SUP, hx GERD Melena overnight P:   Protonix per tube. TF per nutrition. GI consult called.  HEMATOLOGIC A:   Acute blood loss anemia P:  Follow serial CBC. Transfusion goal 8.0 given cardiac event, active blood loss.  INFECTIOUS A:   No evidence active infxn P:   Follow clinically off abx  ENDOCRINE A:   DM   P:   SSI per ICU protocol  NEUROLOGIC A:   Encephalopathy  Sedation for MV Therapeutic hypothermia P:   RASS goal: 0 Fentanyl PRN Versed PRN Cisatracurium off EEG diffuse slowing MS improved, will hold off calling neuro for now  FAMILY  - Updates: No family bedside 10/12.  - Inter-disciplinary family meet or Palliative Care meeting due by: 10/14  The patient is critically ill with multiple organ systems failure and requires high complexity decision making for assessment and support, frequent evaluation and titration of therapies, application of advanced monitoring technologies and extensive interpretation of multiple databases.   Critical Care Time devoted to patient care services described in this note is  35  Minutes. This time reflects time of care of this signee Dr Jennet Maduro. This critical care time does not reflect procedure time, or teaching time or supervisory time of PA/NP/Med student/Med Resident etc but could involve care discussion time.  Rush Farmer, M.D. [facility] Pulmonary/Critical Care Medicine. Pager: [phone]. After hours pager: [phone].

## 2016-10-07 NOTE — Progress Notes (Signed)
[place] Progress Note Patient Name: AMORA RUBLEY DOB: June 28, 1956 MRN: [id number]   Date of Service  10/07/2016  HPI/Events of Note  hgb 5.6  eICU Interventions  2 units prbc's     Intervention Category Evaluation Type: Other  Asenath Balash 10/07/2016, 4:25 AM

## 2016-10-07 NOTE — Progress Notes (Signed)
MEDICATION RELATED NOTE   Pharmacy Re: Home medications  Allergies  Allergen Reactions  . Ace Inhibitors Anaphylaxis, Shortness Of Breath, Swelling and Other (See Comments)    Tongue and throat swells, cannot breathe  . Corn-Containing Products Diarrhea and Other (See Comments)    Family states pt cannot eat corn    Medications:  Prescriptions Prior to Admission  Medication Sig Dispense Refill Last Dose  . albuterol (PROVENTIL HFA;VENTOLIN HFA) 108 (90 Base) MCG/ACT inhaler Inhale 1-2 puffs into the lungs every 6 (six) hours as needed for wheezing or shortness of breath. 1 Inhaler 1 unknown  . amLODipine (NORVASC) 10 MG tablet Take 10 mg by mouth daily.      Marland Kitchen gabapentin (NEURONTIN) 300 MG capsule Take 300 mg by mouth at bedtime.      . pravastatin (PRAVACHOL) 20 MG tablet Take 20 mg by mouth at bedtime. Reported on 01/27/2016   Taking  . acetaminophen (TYLENOL) 500 MG tablet Take 1,500 mg by mouth every 8 (eight) hours as needed for moderate pain or headache. Reported on 06/08/2016   Taking  . calcium acetate (PHOSLO) 667 MG capsule Take by mouth 3 (three) times daily with meals. Reported on 06/15/2016   Taking  . cinacalcet (SENSIPAR) 90 MG tablet Take 90 mg by mouth daily.   Taking  . famotidine (ACID REDUCER MAXIMUM STRENGTH) 20 MG tablet Take 20 mg by mouth as needed for heartburn or indigestion.   Taking  . fexofenadine (ALLEGRA) 180 MG tablet Take 180 mg by mouth daily.   Taking    Assessment: We have made multiple attempts to confirm home medications with family, [facility], [facility] but have been unable to obtain her history.   Her current list was reviewed last by:  Please consider this un-confirmed medication list and resume those medications you feel most appropriate for her care.  Plan:    I have marked her med-hx. as complete without checking the taking or last dose boxes.  Rober Minion, PharmD., MS Clinical Pharmacist Pager:  [phone] Thank you for allowing pharmacy to be  part of this patients care team. 10/07/2016,8:26 AM

## 2016-10-07 NOTE — Code Documentation (Signed)
  Patient Name: Kim Hodge   MRN: [id number]   Date of Birth/ Sex: 05-24-1956 , female      Admission Date: 10/01/2016  Attending Provider: Lorretta Harp, MD  Primary Diagnosis: Cardiac arrest [facility])   Indication: Pt was intubated in the critical care unit until this PM, when she was noted to be pulseless. Code blue was subsequently called. At the time of arrival on scene, ACLS protocol was underway. She was in PEA arrest at my arrival with possible prior asystole reported. ROSC was    Technical Description:  - CPR performance duration:  12 minutes  - Was defibrillation or cardioversion used? No   - Was external pacer placed? No  - Was patient intubated pre/post CPR? Yes   Medications Administered: Y = Yes; Blank = No Amiodarone    Atropine    Calcium    Epinephrine  Y  Lidocaine    Magnesium    Norepinephrine    Phenylephrine    Sodium bicarbonate  Y  Vasopressin     Post CPR evaluation:  - Final Status - Was patient successfully resuscitated ? Yes - What is current rhythm? Sinus rhythm - What is current hemodynamic status? Requiring pressor support  Miscellaneous Information:  - Labs sent, including: ABG, Cmet, CBC  - Primary team notified?  Yes  - Family Notified? Yes, sister at bedside  - Additional notes/ transfer status:      Collier Salina, MD PGY-II Internal Medicine Resident Pager# [phone] 10/07/2016, 9:14 PM

## 2016-10-07 NOTE — Progress Notes (Signed)
[id number] Panic Hbg 5.6 from lab. Rechecked patient for any bleeding sites and L groin had developed hematoma and bruising at previous sheath site.  Earlier in shift had been soft with no hematoma/bruising. Also during bath 20 min previously has trace amount bleeding at vaginal area with wiping. CCM alerted and order for transfusion. Hematoma marked, pressure held and applied to hematoma area, when rechecked had not grown.  Advised Dr Irish Lack (on call  Cards MD) of events and plan. Son called and advised him about blood transfusion.  Marland [id number] Lab called PTT>200 (third morning) 0519 Advised CCM MD Dr Mortimer Fries.

## 2016-10-07 NOTE — Progress Notes (Signed)
Patient noted to be pulseless with asystole at 2043 whiles family were in the room. Code blue initiated and ROSC achieved at 2054. Next of kin at bedside now and updated by MD.  Patient  arrested at 2129 and 2131 with a ventricular fibrillation rhythm requiring shock at both times.

## 2016-10-07 NOTE — Progress Notes (Signed)
 Patient Name: Kim Hodge Date of Encounter: 10/07/2016  Primary Cardiologist: BERRY  Hospital Problem List     Principal Problem:   Cardiac arrest (HCC) Active Problems:   ST elevation myocardial infarction (STEMI) of inferolateral wall, initial episode of care (HCC)   Presence of drug coated stent in left circumflex coronary artery   Multiple vessel coronary artery disease - diffuse severe RCA, LAD-Diag & now stented Cx following 100% occlusion   Diabetes mellitus type 2, uncontrolled, with complications (HCC)   Cardiogenic shock (HCC)   Hyperlipidemia   Essential hypertension   Tobacco abuse   ESRD on dialysis (HCC)   Acute blood loss anemia   Acute respiratory failure with hypoxia (HCC)   Anemia of chronic disease   Anoxic encephalopathy (HCC)   Recurrent hemorrhagic shock-   Subjective   Yesterday's events: This morning when none of 5.6. Getting 2 more units of blood. Started her blocker currently being held.  Dark loose stool noted. Being sent for Hemoccult. Wonder if she may be having some GI bleed from ischemic gut.  Remains intubated, Not on sedation now - rewarmed, on bear-hugger Opens eyes to voice.    Inpatient Medications    . aspirin  81 mg Oral Daily  . atorvastatin  80 mg Oral q1800  . chlorhexidine gluconate (MEDLINE KIT)  15 mL Mouth Rinse BID  . clopidogrel  75 mg Oral Q breakfast  . feeding supplement (NEPRO CARB STEADY)  1,000 mL Per Tube Q24H  . feeding supplement (PRO-STAT SUGAR FREE 64)  60 mL Per Tube TID  . insulin aspart  1-3 Units Subcutaneous Q4H  . insulin glargine  10 Units Subcutaneous QHS  . mouth rinse  15 mL Mouth Rinse 10 times per day  . pantoprazole sodium  40 mg Per Tube Daily  . sodium chloride  1,000 mL Intravenous Once  . sodium chloride flush  10-40 mL Intracatheter Q12H    Vital Signs    Vitals:   10/07/16 0800 10/07/16 0809 10/07/16 0815 10/07/16 0845  BP: (!) 72/51 (!) 72/50  (!) 83/56  Pulse:    86  Resp:  (!) 25 (!) 24    Temp:  97.2 F (36.2 C)    TempSrc:  Oral    SpO2:   100%   Weight:      Height:        Intake/Output Summary (Last 24 hours) at 10/07/16 0915 Last data filed at 10/07/16 0809  Gross per 24 hour  Intake          1744.33 ml  Output              925 ml  Net           819.33 ml   Filed Weights   10/05/16 0600 10/06/16 0326 10/07/16 0455  Weight: 86.5 kg (190 lb 11.2 oz) 89.2 kg (196 lb 10.4 oz) 87.9 kg (193 lb 12.6 oz)    Physical Exam   GEN: Intubated & sedated, - Opens eyes only to commands, but does not track direction. Was not able to get purposeful movement HEENT: Grossly normal.  ETT in place Neck: Supple, no JVD, carotid bruits, or masses. Cardiac: Tachy but RRR, distant heart sounds. No obvious murmurs, rubs, or gallops. No clubbing, cyanosis, edema.  Radials/DP/PT 2+ and equal bilaterally.  Respiratory:  Respirations regular and unlabored, clear to auscultation bilaterally.. GI: Soft, nontender, nondistended, BS + x 4. MS: no deformity or atrophy. Skin: warm and dry,   no rash.  left groin hematoma seems relatively stable. There is a fist size firm hematoma at the access site, but no progression. The leg itself does not appear to be that much more swollen than the right leg.  Neuro:  sedated Psych: sedated  Labs    CBC  Recent Labs  10/06/16 1030 10/06/16 1112 10/07/16 0402  WBC 11.6*  --  12.0*  HGB 8.2* 7.5* 5.6*  HCT 23.9* 22.0* 17.0*  MCV 89.8  --  92.9  PLT 97*  --  100*   Basic Metabolic Panel  Recent Labs  10/06/16 1630 10/06/16 1700 10/07/16 0402 10/07/16 0403  NA 139  --   --  138  K 4.5  --   --  4.7  CL 106  --   --  108  CO2 28  --   --  26  GLUCOSE 93  --   --  95  BUN 23*  --   --  27*  CREATININE 2.55*  --   --  2.23*  CALCIUM 8.5*  --   --  8.0*  MG  --  2.5* 2.3  --   PHOS 3.9  --   --  3.1   Liver Function Tests  Recent Labs  10/07/16 0403 10/07/16 0558  AST  --  98*  ALT  --  79*  ALKPHOS  --  54    BILITOT  --  0.6  PROT  --  3.3*  ALBUMIN 1.4* 1.4*   No results for input(s): LIPASE, AMYLASE in the last 72 hours. Cardiac Enzymes No results for input(s): CKTOTAL, CKMB, CKMBINDEX, TROPONINI in the last 72 hours. BNP Invalid input(s): POCBNP D-Dimer No results for input(s): DDIMER in the last 72 hours. Hemoglobin A1C No results for input(s): HGBA1C in the last 72 hours. Fasting Lipid Panel No results for input(s): CHOL, HDL, LDLCALC, TRIG, CHOLHDL, LDLDIRECT in the last 72 hours. Thyroid Function Tests No results for input(s): TSH, T4TOTAL, T3FREE, THYROIDAB in the last 72 hours.  Invalid input(s): FREET3  Telemetry    Sinus tachycardia low 100s  ECG    S Tachy 107 (not Aflutter) Persistent Inferior (subtle lateral TWI) STE  Cardiology    Diagnostic Diagram      Post-Intervention Diagram           Overlapping Synergy DES 2.25 mm x 20 & 12  EF ~50-55%   2-D Echo 10/04/2016: Difficult study. Very limited - 12 function appears to be normal. Mildly reduced RV function. Calcified aortic valve, cannot assess further.   Ct Abdomen Pelvis Wo Contrast  Addendum Date: 10/05/2016   ADDENDUM REPORT: 10/05/2016 18:20 ADDENDUM: These results were called by telephone at the time of interpretation on 10/05/2016 at 6:19 pm to Dr. Byrum, who verbally acknowledged these results. Electronically Signed   By: Charles  Clark M.D.   On: 10/05/2016 18:20   Result Date: 10/05/2016 CLINICAL DATA:  Cardiac catheterization 10/21/2016. Rule out retroperitoneal hemorrhage. Drop in hemoglobin. EXAM: CT ABDOMEN AND PELVIS WITHOUT CONTRAST TECHNIQUE: Multidetector CT imaging of the abdomen and pelvis was performed following the standard protocol without IV contrast. COMPARISON:  None. FINDINGS: Lower chest: Small anterior pneumothorax on the left. This is not identified on chest x-ray this morning. Repeat chest x-ray recommended. Mild bibasilar atelectasis. Diffuse coronary calcification.  Hepatobiliary: Contrast filled gallbladder due to recent catheterization. Gallbladder wall non thickened. No biliary dilatation. No focal liver lesion. Pancreas: Negative Spleen: Negative Adrenals/Urinary Tract: Negative for renal obstruction or mass.   No urinary tract calculi. Urinary bladder empty containing some gas possibly from recent catheterization. Stomach/Bowel: NG tube in the stomach. Stomach and duodenum normal. No bowel obstruction or mass. Diverticulosis in the left colon without diverticulitis. Normal appendix. Vascular/Lymphatic: Atherosclerotic abdominal aorta without aneurysm. Atherosclerotic disease in the iliac arteries. Soft tissue stranding in the left groin related to small hematoma from recent catheterization. No retroperitoneal hematoma Right femoral venous catheter tip in the right common iliac vein. Reproductive: Hysterectomy.  No pelvic mass. Other: No free-fluid Musculoskeletal: Midline ventral hernia containing fat. No acute skeletal abnormality. IMPRESSION: Small left-sided pneumothorax. Small left groin hematoma from recent cardiac catheterization. Negative for retroperitoneal hematoma. Electronically Signed: By: Franchot Gallo M.D. On: 10/05/2016 18:14   Dg Chest Port 1 View  Result Date: 10/07/2016 CLINICAL DATA:  Endotracheal tube. EXAM: PORTABLE CHEST 1 VIEW COMPARISON:  10/06/2016 FINDINGS: Endotracheal tube tip between the clavicular heads and carina. Left IJ catheter, tip at the upper cavoatrial junction. An orogastric tube reaches the stomach at least. New streaky basilar opacity on the right. Stable mild streaky density at the left base. Normal heart size and mediastinal contours. No effusion or pneumothorax. IMPRESSION: 1. Stable positioning of tubes and central line. 2. Basilar atelectasis, increased on the right since yesterday. Electronically Signed   By: Monte Fantasia M.D.   On: 10/07/2016 08:39   Dg Chest Port 1 View  Result Date: 10/06/2016 CLINICAL DATA:   Intubated patient, respiratory failure, end-stage renal disease EXAM: PORTABLE CHEST 1 VIEW COMPARISON:  Portable chest x-ray of October 05, 2016 FINDINGS: The lungs are well-expanded. There is left lower lobe interstitial density and to a lesser extent right lower lobe interstitial density. There is no alveolar infiltrate or pleural effusion or pneumothorax. The heart and pulmonary vascularity are normal. There is calcification in the wall of the aortic arch. The endotracheal tube tip lies approximately 2.4 cm above the carina. The esophagogastric tube tip in proximal port lie below the GE junction. The left internal jugular venous catheter tip projects over the midportion of the SVC. IMPRESSION: Bibasilar subsegmental atelectasis.  No alveolar pneumonia nor CHF. The support tubes are in reasonable position. Aortic atherosclerosis. Electronically Signed   By: David  Martinique M.D.   On: 10/06/2016 07:58   Dg Chest Port 1 View  Result Date: 10/05/2016 CLINICAL DATA:  Left pneumothorax on CT abdomen today EXAM: PORTABLE CHEST 1 VIEW COMPARISON:  10/05/2016 chest x-ray and CT abdomen from today. FINDINGS: Endotracheal tube remains low, 1 cm above the carina. Gastric tube enters the stomach. Left jugular central venous catheter tip in the lower SVC. No pneumothorax on the chest x-ray. Definite pneumothorax in the left anterior lung base on CT abdomen from today. No effusion. Negative for edema. Mild bibasilar atelectasis. IMPRESSION: Negative for pneumothorax on chest x-ray. The small pneumothorax on the CT abdomen likely does not extend into the lung apex. Endotracheal tube remains low, 1 cm above the carina. Mild bibasilar atelectasis. Electronically Signed   By: Franchot Gallo M.D.   On: 10/05/2016 19:35    Patient Profile     60 y/o woman without prior Cardiac History, ESRD on HD, tobacco abuse -> called EMS b/c bleeding HD fistula --> upon EMS arrival was noted to be in cardiac arrest --> CPR initiated  (significant blood loss noted).  Upon arrival to ER - EKG demonstrated Inferolateral STE (not seen PTA).  Taken for urgent cath after fistula was sutured & bleeding stopped. Not felt to be [employer]  due to bleed & co-morbidities.  Originally HypovolemicPossible hemorrhagic Shock was considered the etiology.  Cath revealed extensive MV CAD with occluded Cx - treated with 2 overlapping DES Central lines placed for pressors & HD access. Transfused PRBC for initial hemoglobin of roughly 8.5. Was given tranexamic acid in the ED for extensive bleeding -- > question if extensive clotting after this administration could potentially responsible for circumflex artery occlusion as the ST elevation did not occur until after arrival and this was administered.  Assessment & Plan    Principal Problem:   Cardiac arrest (HCC) Active Problems:   ST elevation myocardial infarction (STEMI) of inferolateral wall, initial episode of care (HCC)   Presence of drug coated stent in left circumflex coronary artery   Multiple vessel coronary artery disease - diffuse severe RCA, LAD-Diag & now stented Cx following 100% occlusion   Diabetes mellitus type 2, uncontrolled, with complications (HCC)   Cardiogenic shock (HCC)   Hyperlipidemia   Essential hypertension   Tobacco abuse   ESRD on dialysis (HCC)   Acute blood loss anemia   Acute respiratory failure with hypoxia (HCC)   Anemia of chronic disease   Anoxic encephalopathy (HCC)  -Recurrent hemorrhagic shock  LVEDP was quite low at catheterization, suggesting volume depletion - would avoid excess fluid removal and dialysis.  From a cardiac standpoint she now has 2 drug-eluting stents in the circumflex with a severe existing disease in LAD and diffusely in the RCA. -- Unfortunately now she has now had several days of progressive anemia but no obvious source of bleeding hematoma in the left groin.   High-dose atorvastatin, aspirin and Plavix ordered. --  with what appears to be ongoing bleed, will probably need to hold aspirin.   Back on pressors again after being hemodynamically stable yesterday. We'll hold beta blocker for now.   No longer on IV heparin  preserved EF on poor quality echo. Would consider repeat echo later this week versus early next week.  Euvolmeic on CVVHD  Hemoglobin this AM was 5.6. 2 units of packed red blood cells ordered. - Stool sent for guaiac.  Holding aspirin for a couple days until we see the potential source.  Interestingly, hemoglobin on 10/10 showed a significant drop. There is no sign of active bleeding, this makes me concerned that the intermittent readings were also elevated.   PCC M2 continue vent weaning.  As her presentation was not originally cardiac driven, I suspect that she was not overtly symptomatic with the existing CAD she had. With that in mind, in light of recent bleeding issues, the potential best option for she to recover be to consider medical management of the LAD and RCA disease with thoughts to potentially evaluate as an outpatient with either a stress test or relook catheterization with staged PCI.   unfortunately now she is having significant anemia while on dual antiplatelet therapy. Will hold aspirin for now.    We will follow   Signed, David Harding, M.D., M.S. Interventional Cardiologist   Pager # [phone] Phone # [phone] [address] [place], [place] [address]  10/07/2016, 9:15 AM     

## 2016-10-07 NOTE — Progress Notes (Signed)
Manual pressure being held by Aaron Edelman in cath lab. Pt grimancing in pain, 50 fentanyl and 2 versed given.

## 2016-10-07 NOTE — Progress Notes (Addendum)
eLink Physician-Brief Progress Note Patient Name: Kim Hodge DOB: 1956/10/15 MRN: [id number]   Date of Service  10/07/2016  HPI/Events of Note  Hb drop to 6.6 p-arrest   eICU Interventions  Transfuse 2 U PRBC Check coags Spoke to son - he understands grave condition, would like to speak to other family before making decisions about further resuscitation     Intervention Category Intermediate Interventions: Bleeding - evaluation and treatment with blood products  ALVA,RAKESH V. 10/07/2016, 9:21 PM

## 2016-10-07 NOTE — Progress Notes (Signed)
Preliminary results by tech - Left Groin Check  Completed. Negative for an active pseudoaneurysm. Large hematoma is noted.  Oda Cogan, BS, RDMS, RVT

## 2016-10-08 MED FILL — Medication: Qty: 1 | Status: AC

## 2016-10-09 LAB — POCT I-STAT, CHEM 8
BUN: 33 mg/dL — ABNORMAL HIGH (ref 6–20)
CALCIUM ION: 1.13 mmol/L — AB (ref 1.15–1.40)
CHLORIDE: 103 mmol/L (ref 101–111)
Creatinine, Ser: 2.2 mg/dL — ABNORMAL HIGH (ref 0.44–1.00)
GLUCOSE: 46 mg/dL — AB (ref 65–99)
HCT: 16 % — ABNORMAL LOW (ref 36.0–46.0)
Hemoglobin: 5.4 g/dL — CL (ref 12.0–15.0)
Potassium: 5.4 mmol/L — ABNORMAL HIGH (ref 3.5–5.1)
Sodium: 141 mmol/L (ref 135–145)
TCO2: 26 mmol/L (ref 0–100)

## 2016-10-11 ENCOUNTER — Telehealth: Payer: Self-pay

## 2016-10-11 LAB — TYPE AND SCREEN
ABO/RH(D): O POS
ANTIBODY SCREEN: NEGATIVE
UNIT DIVISION: 0
UNIT DIVISION: 0
Unit division: 0
Unit division: 0

## 2016-10-11 NOTE — Telephone Encounter (Signed)
On 2016-10-13 I received a death certificate from [facility] (original) The death certificate is for cremation. The patient is a patient of Doctor Nelda Marseille. The death certificate will be taken to [facility] ([id number]) this am for Doctor Halford Chessman to sign since Doctor Nelda Marseille is on vacation this week.  On October 13, 2016 I received the death certificate back from Doctor [place]. I got the death certificate ready and called the funeral home to let them know the death certificate is ready for pickup. I also faxed a copy to the funeral home per the funeral home request.

## 2016-10-12 ENCOUNTER — Ambulatory Visit: Admit: 2016-10-12 | Payer: Medicare Other | Admitting: Vascular Surgery

## 2016-10-12 SURGERY — REVISION OF ARTERIOVENOUS GORETEX GRAFT
Anesthesia: Choice | Site: Arm Upper | Laterality: Left

## 2016-10-27 NOTE — Progress Notes (Signed)
Death was pronounced after RN Idelia Salm and Tacy Dura kuffour ausculated no heart sounds and breaths for one minute each.Family and chaplain at bedside.

## 2016-10-27 NOTE — Discharge Summary (Signed)
Kim Hodge, Kim Hodge                ACCOUNT NO.:  [id number]  MEDICAL RECORD NO.:  [id number]  LOCATION:  2H13C                        FACILITY:  [facility]  PHYSICIAN:  Providence Lanius, MD  DATE OF BIRTH:  [birth year]  DATE OF ADMISSION:  10/04/2016 DATE OF DISCHARGE:  10/15/16                              DISCHARGE SUMMARY   DEATH SUMMARY  PRIMARY DIAGNOSIS/CAUSE OF DEATH:  Hemorrhagic shock.  SECONDARY DIAGNOSES:  Pulseless activity cardiac arrest.  Acute respiratory failure.  ST-segment elevation myocardial infarction. Cardiogenic shock.  End-stage renal disease, on hemodialysis.  Acute blood loss anemia.  Diabetes mellitus.  Anoxic encephalopathy.  HOSPITAL COURSE:  The patient is a 60 year old female with past medical history significant for end-stage renal disease on hemodialysis, who has had difficulty controlling her bleeding from AV fistula in the past, underwent a recent thrombectomy of her AV fistula.  She was at home when she noticed significant bleeding from her AV fistula.  EMS was called. Upon arrival, the patient was in bed with an estimated 2 L of blood in the bed and the patient was in pulseless electrical activity, cardiac arrest.  Resuscitation proceeded.  The patient returns to circulation, was intubated in the emergency department and PCM was called to admit. The patient was admitted to the cardiac care unit and underwent the hypothermia protocol.  The patient showed signs of some neurologic recovery; however, her course was complicated by GI bleeding and __________ exacerbation of her multiple medical issues including significant cardiogenic shock with poor hemodynamics.  On the day of the patient's passing her hemoglobin continued to drop and she continued to receive multiple transfusions.  Over on October 07, 2016, the patient had 2 episodes of VFib, was shocked, code blue was called.  Montey Hora, physician assistant with Pulmonary Critical Care had an  extensive discussion with multiple family members including the patient's son and several sisters and they discussed her current clinical condition as well as multiple organ failure and failure to improve with poor prognosis and became evident that the patient's wishes prior to this event under the circumstances that she would not want this level of intervention and they decide to proceed with comfort care.  Comfort care took place and the patient was extubated and expired shortly thereafter.     Providence Lanius, MD     WJY/MEDQ  D:  10/24/2016  T:  10/25/2016  Job:  [id number]

## 2016-10-27 NOTE — H&P (Signed)
Pt terminally weaned to room air. RN and RT at bedside. No complications.

## 2016-10-27 NOTE — Progress Notes (Signed)
269mls of morphine wasted in the sink in the presence of RN Idelia Salm

## 2016-10-27 NOTE — Progress Notes (Signed)
   Met w/ family in waiting room & bedside.  Offered prayer at bedside.  Will follow, as needed.  - Rev. Dothan MDiv ThM

## 2016-10-27 DEATH — deceased

## 2017-03-31 IMAGING — CR DG CHEST 1V PORT
1 series · 1 of 1 positions shown · non-contrast
Comparison: Portable chest x-ray October 05, 2016

CLINICAL DATA: Intubated patient, respiratory failure, end-stage
renal disease

EXAM:
PORTABLE CHEST 1 VIEW

[AP]
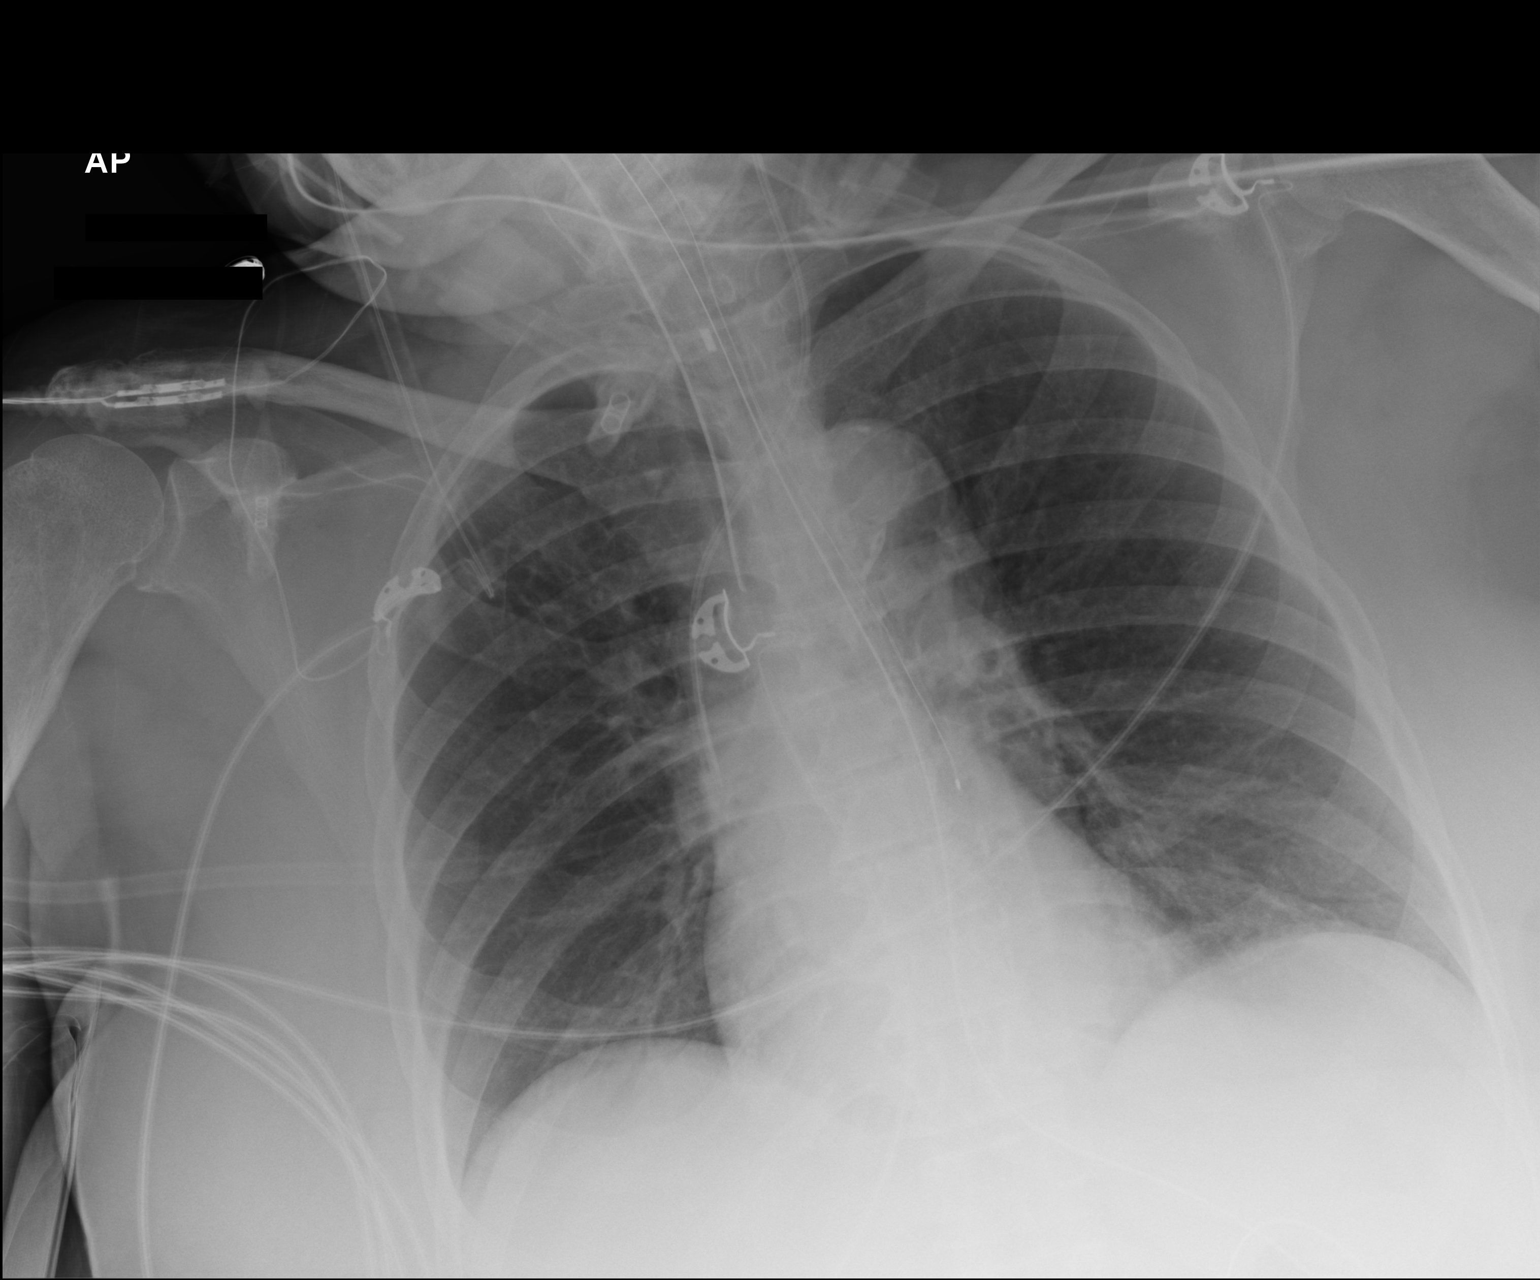

[1 of 1 positions shown; findings below may reference images not displayed]

FINDINGS: The lungs are well-expanded. There is left lower lobe interstitial
density and to a lesser extent right lower lobe interstitial
density. There is no alveolar infiltrate or pleural effusion or
pneumothorax. The heart and pulmonary vascularity are normal. There
is calcification in the wall of the aortic arch. The endotracheal
tube tip lies approximately 2.4 cm above the carina. The
esophagogastric tube tip in proximal port lie below the GE junction.
The left internal jugular venous catheter tip projects over the
midportion of the SVC.
IMPRESSION: Bibasilar subsegmental atelectasis.  No alveolar pneumonia nor CHF.

The support tubes are in reasonable position.

Aortic atherosclerosis.

## 2017-09-10 IMAGING — US US RENAL
1 series · 14 of 25 positions shown · non-contrast
Comparison: Lumbar spine 10/01/2014.

CLINICAL DATA: Chronic renal disease.

EXAM:
RENAL / URINARY TRACT ULTRASOUND COMPLETE

[Series 1: us renal · 0.23mm/px · 14 of 104 slices shown]
[im 1/104]
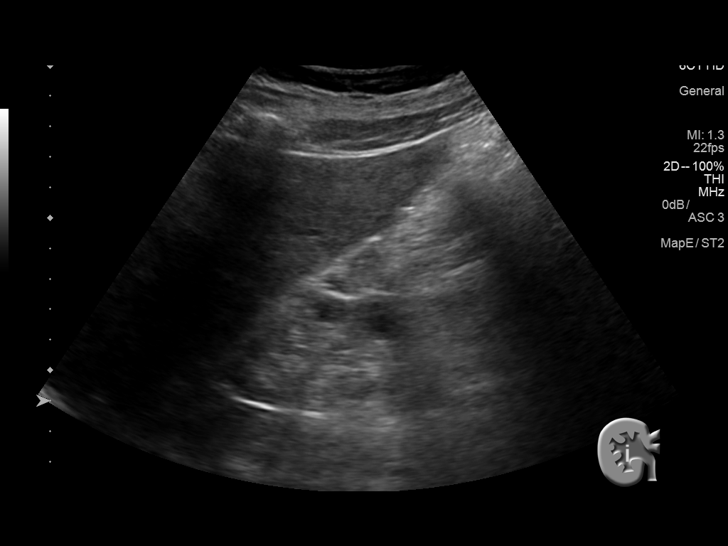
[im 9/104]
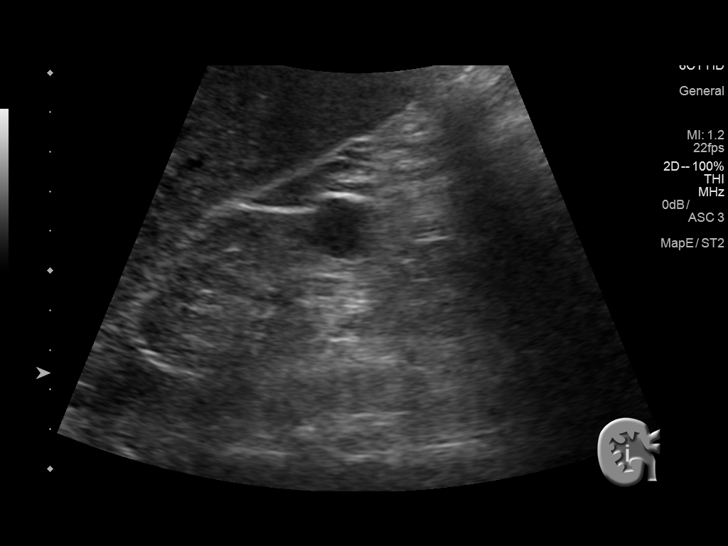
[im 18/104]
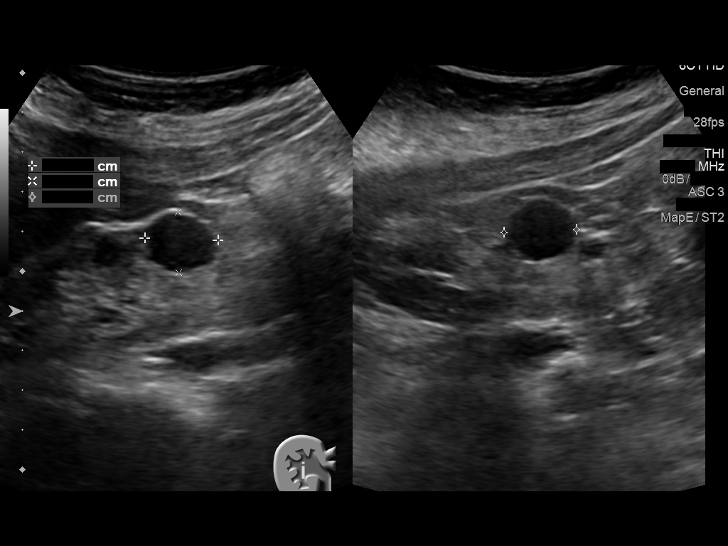
[im 26/104]
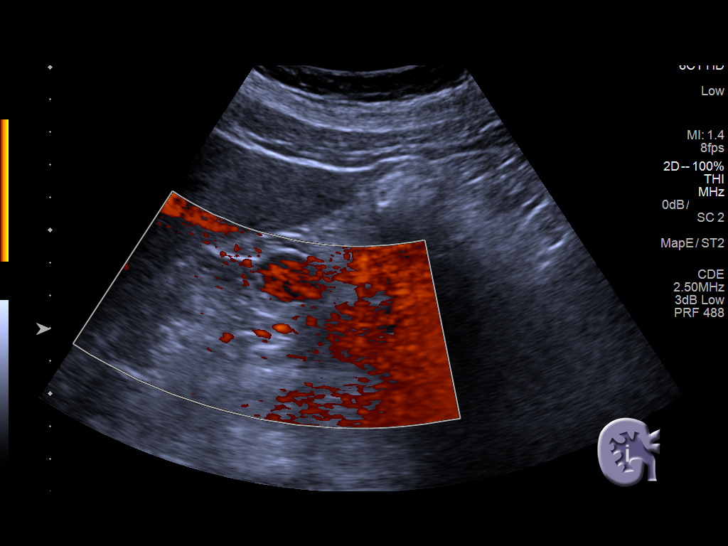
[im 35/104]
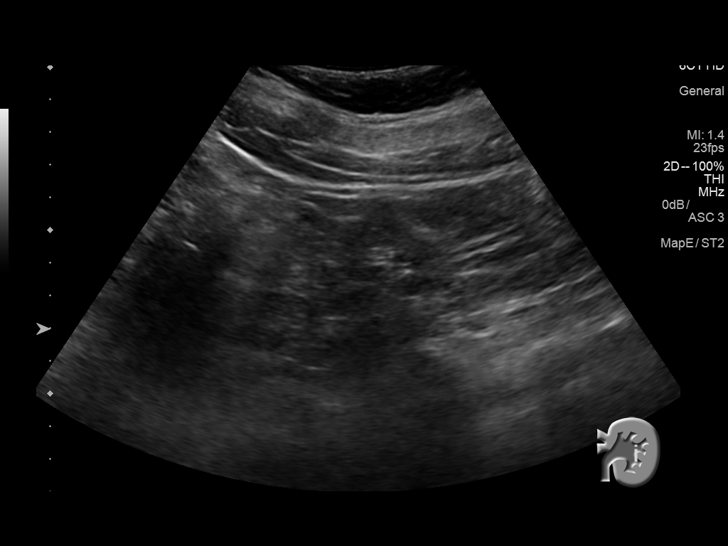
[im 39/104]
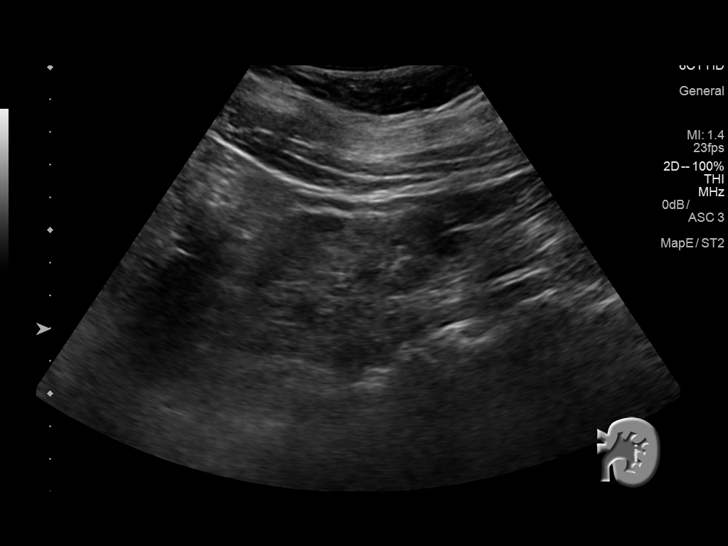
[im 48/104]
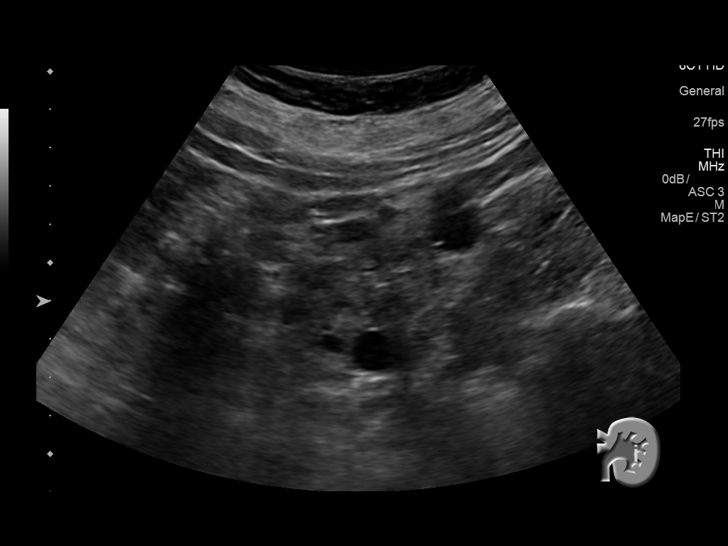
[im 56/104]
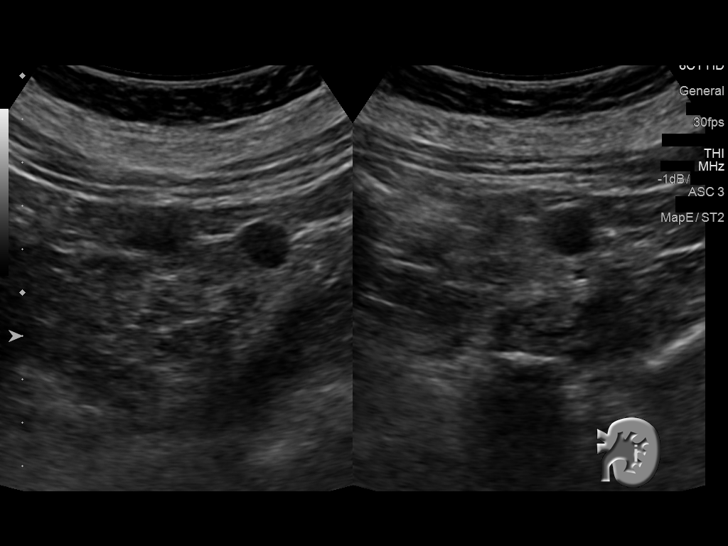
[im 65/104]
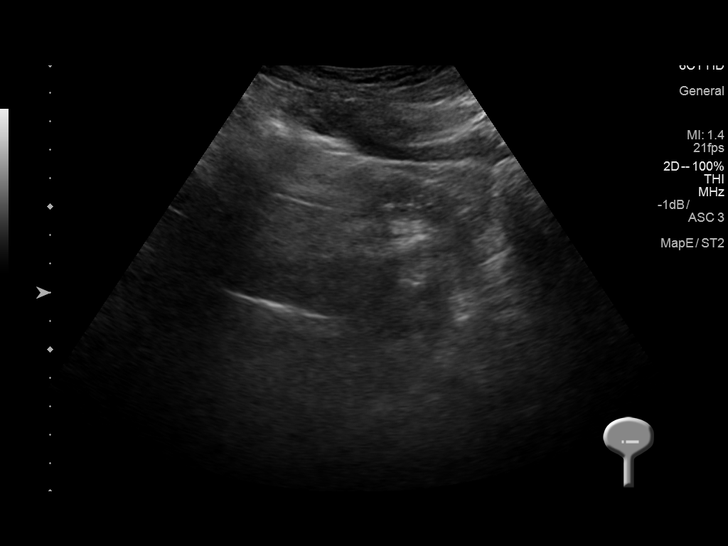
[im 69/104]
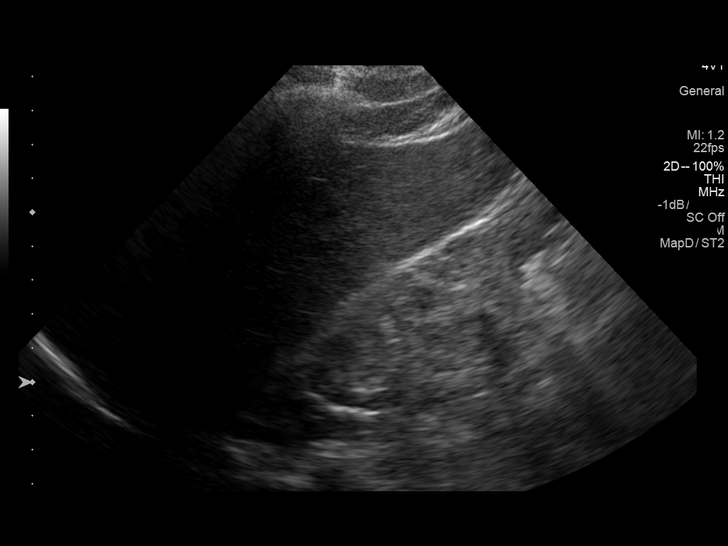
[im 78/104]
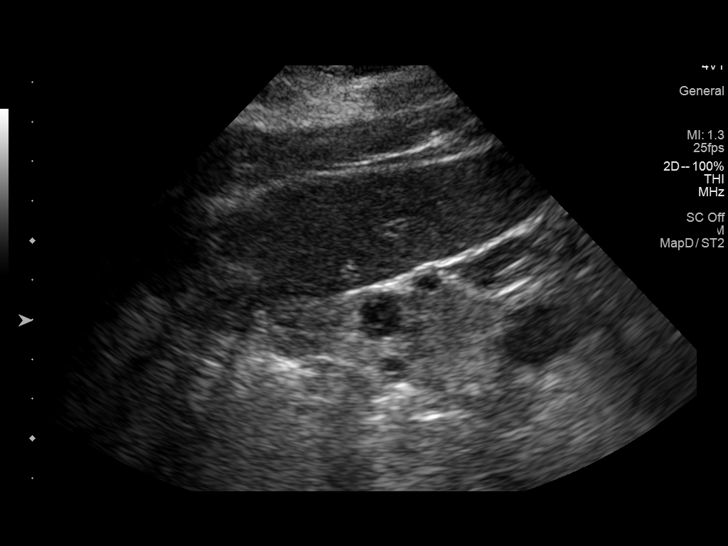
[im 86/104]
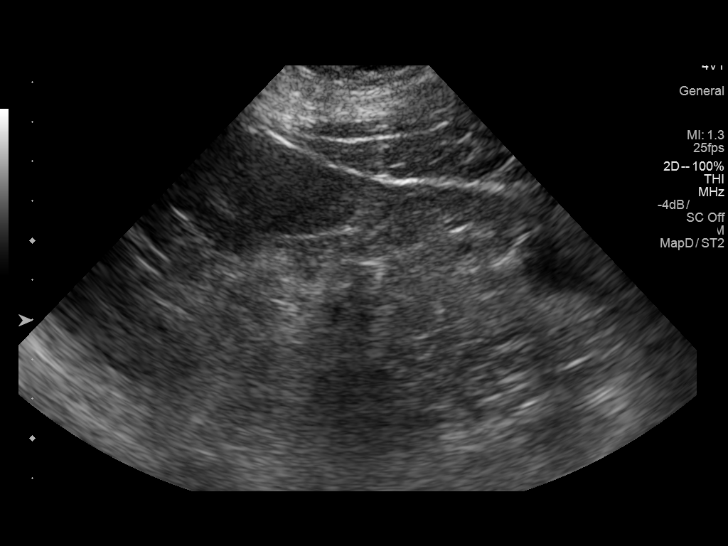
[im 95/104]
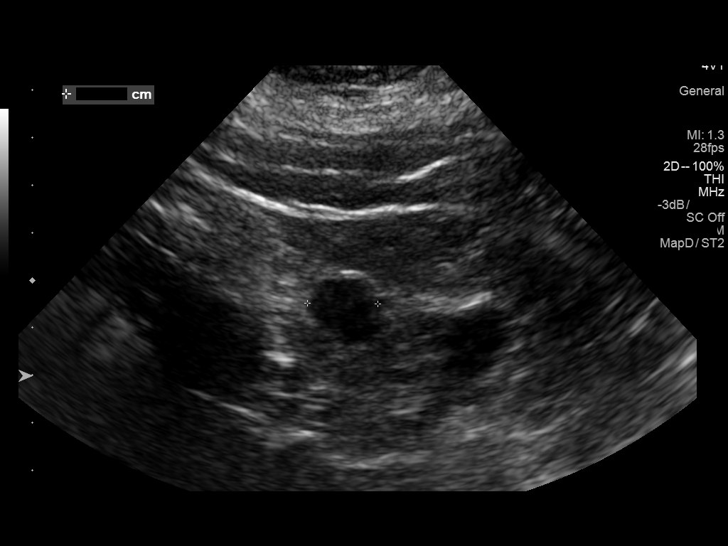
[im 104/104]
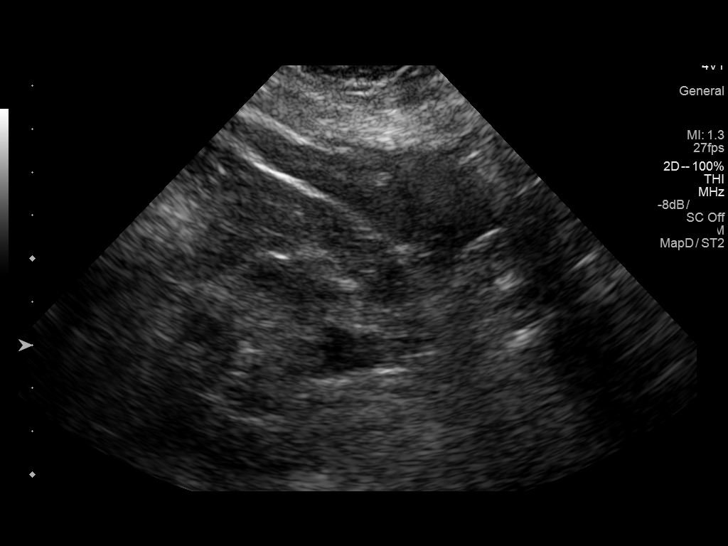

[14 of 25 positions shown; findings below may reference images not displayed]

FINDINGS: Right Kidney:

Length: 6.4 cm. Severe increased echogenicity noted consistent
chronic medical renal disease. Multiple cysts are noted throughout
the right kidney. These are most likely simple but difficult to
evaluate due to the echodensity of the kidneys. No hydronephrosis.

Left Kidney:

Length: 8.0 cm. Severe increased echogenicity noted consistent
chronic medical renal disease. Multiple cysts noted throughout the
left kidney. These most likely simple bladder difficult to evaluate
due to echodensity of the kidneys. No hydronephrosis.

Bladder:

Appears normal for degree of bladder distention.
IMPRESSION: Severe increased echogenicity both kidneys consistent chronic
medical renal disease. Bilateral renal cysts are noted, these are
most likely simple bladder difficult to evaluate. No focal definite
solid lesions identified. There is no hydronephrosis. There is no
bladder distention.
